# Patient Record
Sex: Female | Born: 1960 | Race: Black or African American | Hispanic: No | Marital: Single | Smoking: Former smoker
Health system: Southern US, Community
[De-identification: ages and names within clinical notes are randomized; demographics above are authoritative.]

## PROBLEM LIST (undated history)

## (undated) DIAGNOSIS — N970 Female infertility associated with anovulation: Secondary | ICD-10-CM

## (undated) DIAGNOSIS — E669 Obesity, unspecified: Secondary | ICD-10-CM

## (undated) DIAGNOSIS — Z8742 Personal history of other diseases of the female genital tract: Secondary | ICD-10-CM

## (undated) DIAGNOSIS — M1990 Unspecified osteoarthritis, unspecified site: Secondary | ICD-10-CM

## (undated) DIAGNOSIS — R87619 Unspecified abnormal cytological findings in specimens from cervix uteri: Secondary | ICD-10-CM

## (undated) DIAGNOSIS — I10 Essential (primary) hypertension: Secondary | ICD-10-CM

## (undated) DIAGNOSIS — M069 Rheumatoid arthritis, unspecified: Secondary | ICD-10-CM

## (undated) DIAGNOSIS — N912 Amenorrhea, unspecified: Secondary | ICD-10-CM

## (undated) DIAGNOSIS — Z862 Personal history of diseases of the blood and blood-forming organs and certain disorders involving the immune mechanism: Secondary | ICD-10-CM

## (undated) DIAGNOSIS — R102 Pelvic and perineal pain: Secondary | ICD-10-CM

## (undated) DIAGNOSIS — N644 Mastodynia: Secondary | ICD-10-CM

## (undated) DIAGNOSIS — K219 Gastro-esophageal reflux disease without esophagitis: Secondary | ICD-10-CM

## (undated) DIAGNOSIS — B977 Papillomavirus as the cause of diseases classified elsewhere: Secondary | ICD-10-CM

## (undated) DIAGNOSIS — IMO0002 Reserved for concepts with insufficient information to code with codable children: Secondary | ICD-10-CM

## (undated) DIAGNOSIS — B379 Candidiasis, unspecified: Secondary | ICD-10-CM

## (undated) DIAGNOSIS — B009 Herpesviral infection, unspecified: Secondary | ICD-10-CM

## (undated) HISTORY — DX: Personal history of diseases of the blood and blood-forming organs and certain disorders involving the immune mechanism: Z86.2

## (undated) HISTORY — DX: Obesity, unspecified: E66.9

## (undated) HISTORY — DX: Amenorrhea, unspecified: N91.2

## (undated) HISTORY — DX: Mastodynia: N64.4

## (undated) HISTORY — DX: Female infertility associated with anovulation: N97.0

## (undated) HISTORY — DX: Personal history of other diseases of the female genital tract: Z87.42

## (undated) HISTORY — DX: Papillomavirus as the cause of diseases classified elsewhere: B97.7

## (undated) HISTORY — DX: Rheumatoid arthritis, unspecified: M06.9

## (undated) HISTORY — DX: Herpesviral infection, unspecified: B00.9

## (undated) HISTORY — DX: Pelvic and perineal pain: R10.2

## (undated) HISTORY — DX: Unspecified abnormal cytological findings in specimens from cervix uteri: R87.619

## (undated) HISTORY — DX: Gastro-esophageal reflux disease without esophagitis: K21.9

## (undated) HISTORY — DX: Unspecified osteoarthritis, unspecified site: M19.90

## (undated) HISTORY — DX: Reserved for concepts with insufficient information to code with codable children: IMO0002

## (undated) HISTORY — DX: Essential (primary) hypertension: I10

## (undated) HISTORY — DX: Morbid (severe) obesity due to excess calories: E66.01

## (undated) HISTORY — DX: Candidiasis, unspecified: B37.9

## (undated) SURGERY — EGD (ESOPHAGOGASTRODUODENOSCOPY)
Anesthesia: Moderate Sedation

## (undated) SURGERY — EGD (ESOPHAGOGASTRODUODENOSCOPY)
Anesthesia: Monitor Anesthesia Care

---

## 1998-03-15 ENCOUNTER — Other Ambulatory Visit: Admission: RE | Admit: 1998-03-15 | Discharge: 1998-03-15 | Payer: Self-pay | Admitting: Internal Medicine

## 2000-01-21 ENCOUNTER — Other Ambulatory Visit: Admission: RE | Admit: 2000-01-21 | Discharge: 2000-01-21 | Payer: Self-pay | Admitting: Obstetrics and Gynecology

## 2001-01-06 ENCOUNTER — Other Ambulatory Visit: Admission: RE | Admit: 2001-01-06 | Discharge: 2001-01-06 | Payer: Self-pay | Admitting: Otolaryngology

## 2001-02-08 ENCOUNTER — Other Ambulatory Visit: Admission: RE | Admit: 2001-02-08 | Discharge: 2001-02-08 | Payer: Self-pay | Admitting: Obstetrics and Gynecology

## 2001-02-11 ENCOUNTER — Encounter (INDEPENDENT_AMBULATORY_CARE_PROVIDER_SITE_OTHER): Payer: Self-pay | Admitting: *Deleted

## 2001-02-11 ENCOUNTER — Ambulatory Visit (HOSPITAL_BASED_OUTPATIENT_CLINIC_OR_DEPARTMENT_OTHER): Admission: RE | Admit: 2001-02-11 | Discharge: 2001-02-11 | Payer: Self-pay | Admitting: Otolaryngology

## 2001-03-10 ENCOUNTER — Encounter: Payer: Self-pay | Admitting: Obstetrics and Gynecology

## 2001-03-10 ENCOUNTER — Ambulatory Visit (HOSPITAL_COMMUNITY): Admission: RE | Admit: 2001-03-10 | Discharge: 2001-03-10 | Payer: Self-pay | Admitting: Obstetrics and Gynecology

## 2002-07-12 ENCOUNTER — Other Ambulatory Visit: Admission: RE | Admit: 2002-07-12 | Discharge: 2002-07-12 | Payer: Self-pay | Admitting: Obstetrics and Gynecology

## 2002-07-20 ENCOUNTER — Encounter: Payer: Self-pay | Admitting: Obstetrics and Gynecology

## 2002-07-20 ENCOUNTER — Ambulatory Visit (HOSPITAL_COMMUNITY): Admission: RE | Admit: 2002-07-20 | Discharge: 2002-07-20 | Payer: Self-pay | Admitting: Obstetrics and Gynecology

## 2002-08-18 ENCOUNTER — Ambulatory Visit (HOSPITAL_COMMUNITY): Admission: RE | Admit: 2002-08-18 | Discharge: 2002-08-18 | Payer: Self-pay | Admitting: Nutrition

## 2002-08-18 ENCOUNTER — Encounter: Payer: Self-pay | Admitting: Internal Medicine

## 2002-12-06 ENCOUNTER — Encounter: Payer: Self-pay | Admitting: Internal Medicine

## 2002-12-06 ENCOUNTER — Encounter: Admission: RE | Admit: 2002-12-06 | Discharge: 2002-12-06 | Payer: Self-pay | Admitting: Internal Medicine

## 2002-12-08 ENCOUNTER — Ambulatory Visit (HOSPITAL_COMMUNITY): Admission: RE | Admit: 2002-12-08 | Discharge: 2002-12-08 | Payer: Self-pay | Admitting: Internal Medicine

## 2003-02-13 HISTORY — PX: GASTRIC BYPASS: SHX52

## 2003-06-27 ENCOUNTER — Other Ambulatory Visit: Admission: RE | Admit: 2003-06-27 | Discharge: 2003-06-27 | Payer: Self-pay | Admitting: Obstetrics and Gynecology

## 2003-07-19 ENCOUNTER — Ambulatory Visit (HOSPITAL_COMMUNITY): Admission: RE | Admit: 2003-07-19 | Discharge: 2003-07-19 | Payer: Self-pay | Admitting: Obstetrics and Gynecology

## 2004-09-01 ENCOUNTER — Other Ambulatory Visit: Admission: RE | Admit: 2004-09-01 | Discharge: 2004-09-01 | Payer: Self-pay | Admitting: Obstetrics and Gynecology

## 2004-09-17 ENCOUNTER — Ambulatory Visit (HOSPITAL_COMMUNITY): Admission: RE | Admit: 2004-09-17 | Discharge: 2004-09-17 | Payer: Self-pay | Admitting: Obstetrics and Gynecology

## 2005-10-05 ENCOUNTER — Other Ambulatory Visit: Admission: RE | Admit: 2005-10-05 | Discharge: 2005-10-05 | Payer: Self-pay | Admitting: Obstetrics and Gynecology

## 2005-11-03 ENCOUNTER — Ambulatory Visit (HOSPITAL_COMMUNITY): Admission: RE | Admit: 2005-11-03 | Discharge: 2005-11-03 | Payer: Self-pay | Admitting: Obstetrics and Gynecology

## 2006-07-15 HISTORY — PX: KNEE ARTHROSCOPY: SHX127

## 2006-11-24 ENCOUNTER — Ambulatory Visit (HOSPITAL_COMMUNITY): Admission: RE | Admit: 2006-11-24 | Discharge: 2006-11-24 | Payer: Self-pay | Admitting: Obstetrics and Gynecology

## 2007-07-16 HISTORY — PX: TOTAL KNEE ARTHROPLASTY: SHX125

## 2007-08-08 ENCOUNTER — Inpatient Hospital Stay (HOSPITAL_COMMUNITY): Admission: RE | Admit: 2007-08-08 | Discharge: 2007-08-10 | Payer: Self-pay | Admitting: Orthopedic Surgery

## 2007-08-15 HISTORY — PX: TOTAL KNEE ARTHROPLASTY: SHX125

## 2007-09-13 ENCOUNTER — Inpatient Hospital Stay (HOSPITAL_COMMUNITY): Admission: RE | Admit: 2007-09-13 | Discharge: 2007-09-15 | Payer: Self-pay | Admitting: Orthopedic Surgery

## 2007-11-13 HISTORY — PX: ENDOMETRIAL ABLATION W/ NOVASURE: SUR434

## 2007-12-01 ENCOUNTER — Ambulatory Visit (HOSPITAL_COMMUNITY): Admission: RE | Admit: 2007-12-01 | Discharge: 2007-12-01 | Payer: Self-pay | Admitting: Obstetrics and Gynecology

## 2008-02-13 ENCOUNTER — Ambulatory Visit (HOSPITAL_COMMUNITY): Admission: RE | Admit: 2008-02-13 | Discharge: 2008-02-13 | Payer: Self-pay | Admitting: Obstetrics and Gynecology

## 2008-07-22 IMAGING — CR DG CHEST 2V
3 series · 3 of 3 positions shown · non-contrast
Comparison: none

CLINICAL DATA: Preoperative respiratory exam for knee surgery.
 CHEST ? 2 VIEW:

[view not recorded (1 of 3)]
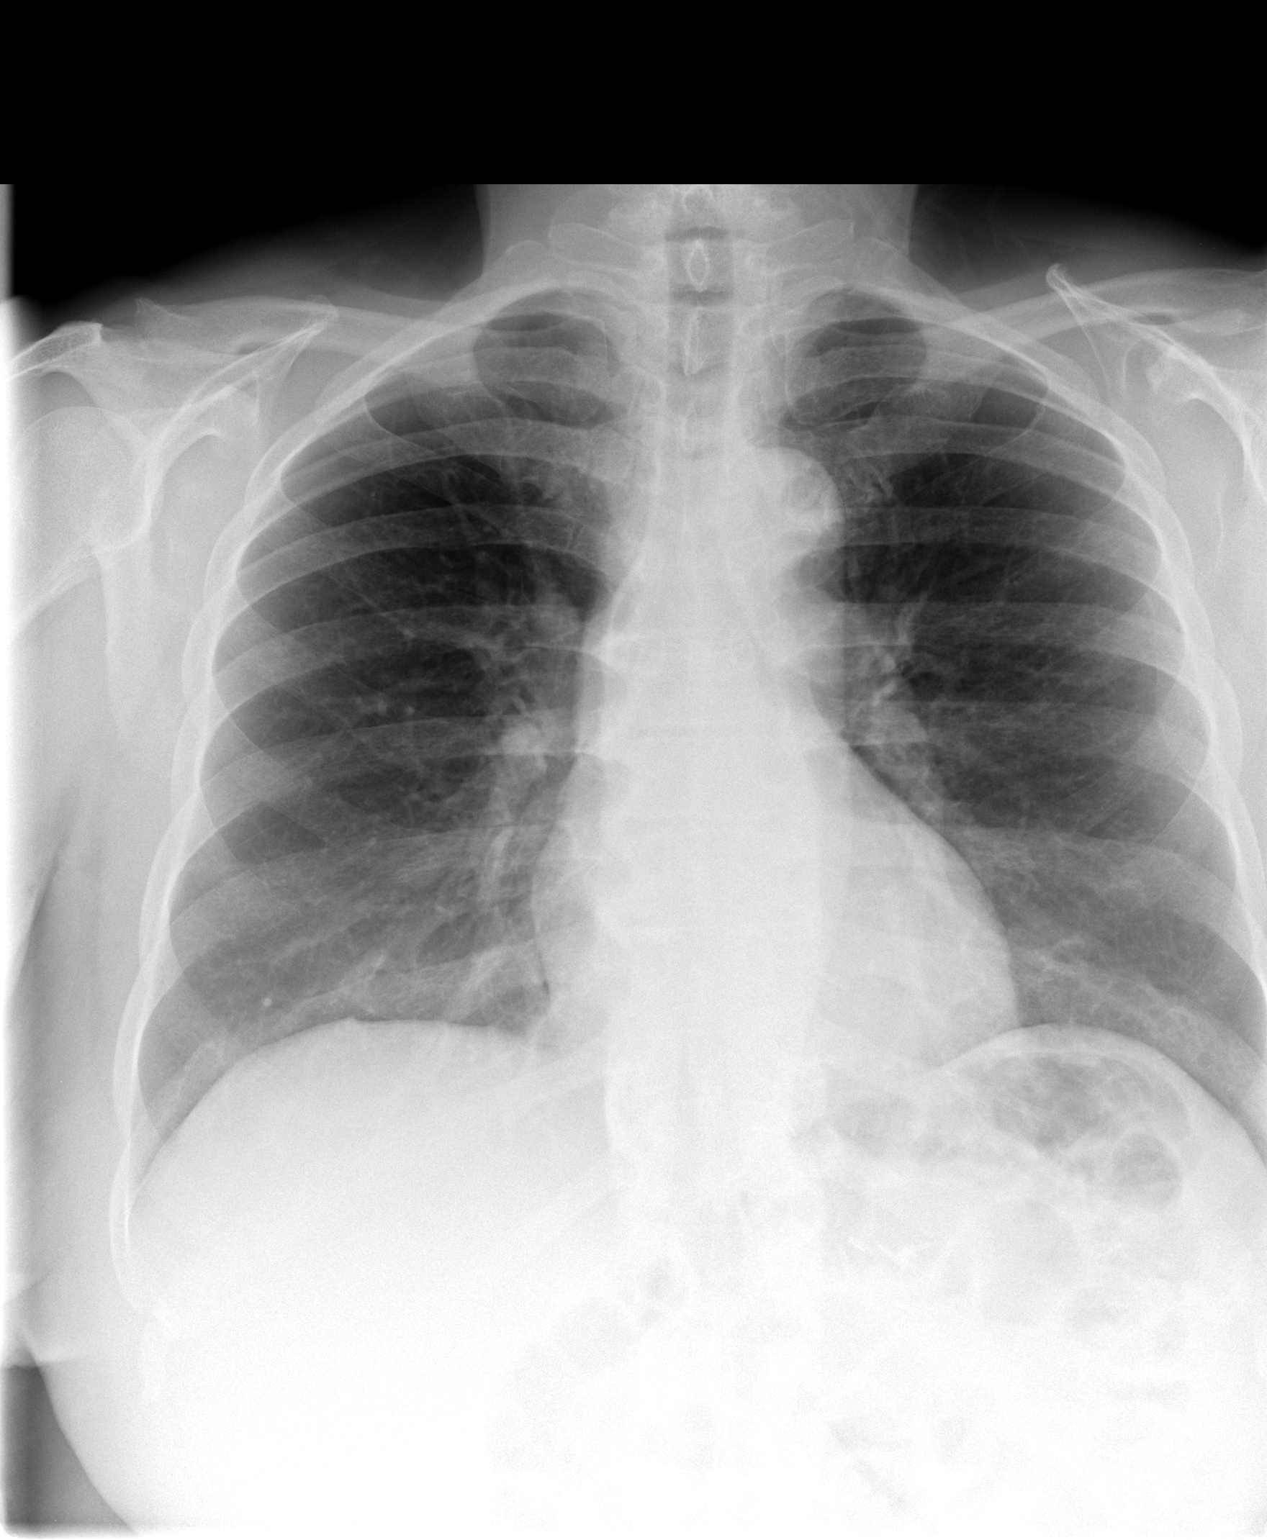

[view not recorded (2 of 3)]
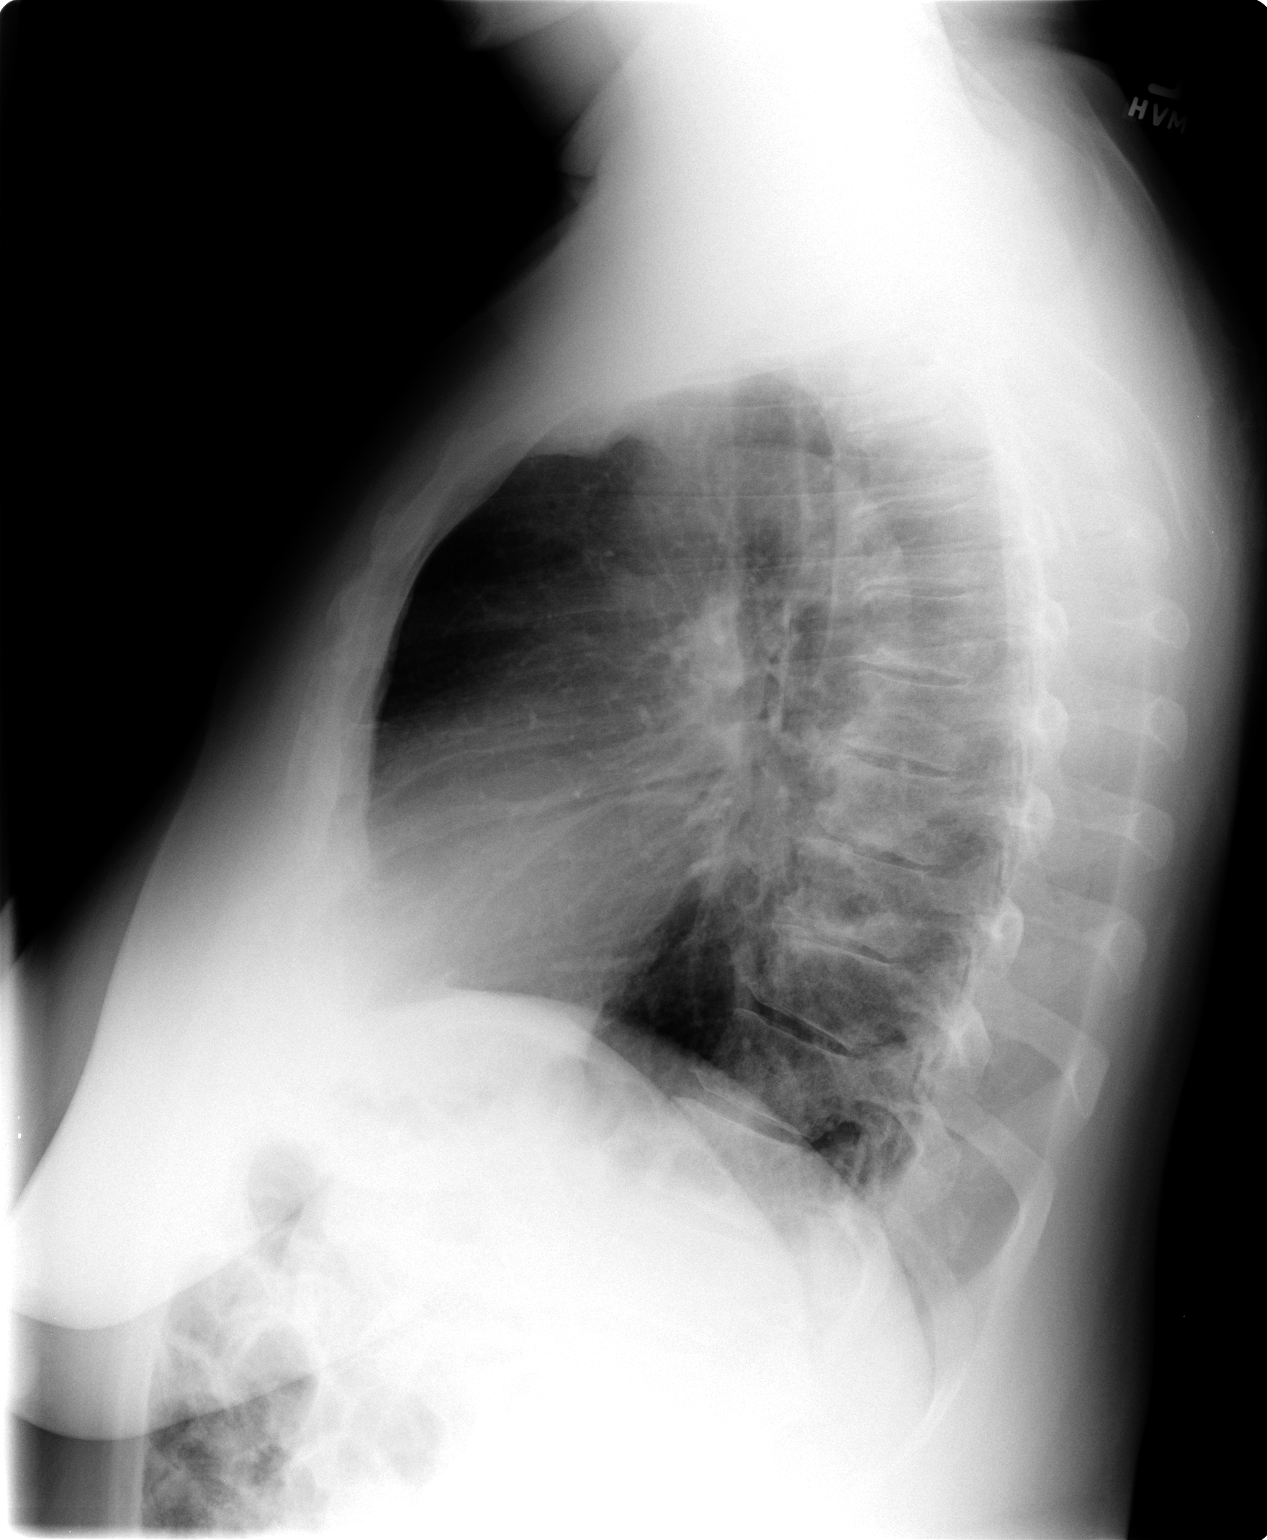

[view not recorded (3 of 3)]
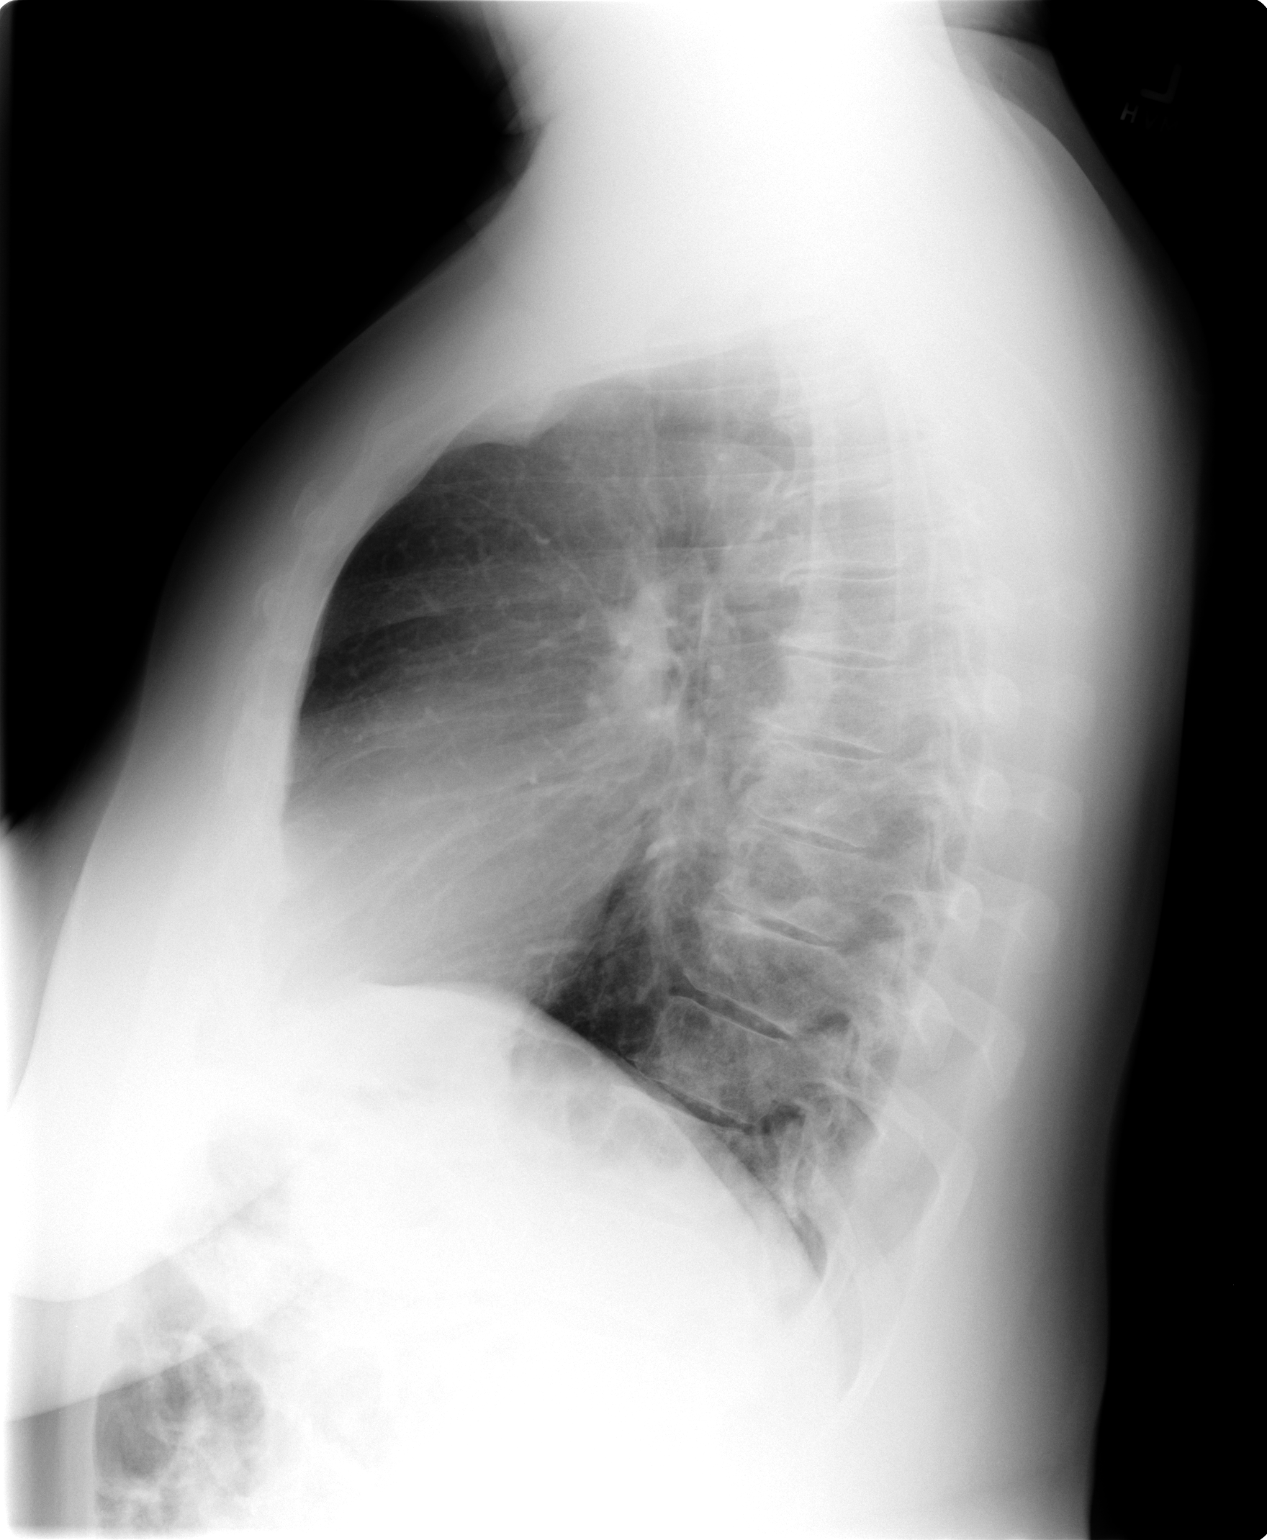

[3 of 3 positions shown; findings below may reference images not displayed]

FINDINGS: Heart and mediastinum are normal.  Lungs are clear.  No effusions.  Ordinary degenerative changes affect the spine.
IMPRESSION: No active disease.

## 2009-03-15 ENCOUNTER — Ambulatory Visit (HOSPITAL_COMMUNITY): Admission: RE | Admit: 2009-03-15 | Discharge: 2009-03-15 | Payer: Self-pay | Admitting: Obstetrics and Gynecology

## 2010-07-01 ENCOUNTER — Ambulatory Visit (HOSPITAL_COMMUNITY): Admission: RE | Admit: 2010-07-01 | Discharge: 2010-07-01 | Payer: Self-pay | Admitting: Internal Medicine

## 2010-10-05 ENCOUNTER — Encounter: Payer: Self-pay | Admitting: Internal Medicine

## 2011-01-27 NOTE — H&P (Signed)
NAME:  Mackenzie Rivera, Mackenzie Rivera                  ACCOUNT NO.:  [id number]   MEDICAL RECORD NO.:  [id number]           PATIENT TYPE:   LOCATION:                                 FACILITY:   PHYSICIAN:  Hal Morales, M.D.DATE OF BIRTH:  Jan 16, 1961   DATE OF ADMISSION:  12/01/2007  DATE OF DISCHARGE:                              HISTORY & PHYSICAL   HISTORY OF PRESENT ILLNESS:  The patient is a 50 year old black single  female, para 0, who presents for endometrial ablation for menorrhagia.  The patient underwent menarche at age 54 and has always had irregular  menses.  She would have several months of amenorrhea followed by  prolonged menses.  For the last 15 years, the patient has managed her  irregular menses and menorrhagia with oral contraceptive pills, and she  currently has good relief of her menses with her oral contraceptive  pills.  She currently also has problems with menstrual migraines, which  have been improved since she has been using her oral contraceptive pills  with 9 weeks of active pills and only 1 week off.  She, however, wishes  to be able to discontinue her oral contraceptive pills and therefore  wants to consider endometrial ablation.  Last menstrual period was  October 28, 2007.  Last Pap smear was November 08, 2007.  Pap smears  have been normal.  The TSH and prolactin in the past have been normal.   PAST MEDICAL HISTORY:  The patient has morbid obesity, which was treated  with gastric bypass surgery in 2004 with approximately a 159-pound  weight loss.  The patient has rheumatoid arthritis and osteoarthritis.  She has migraine headaches.  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   PAST SURGICAL HISTORY:  Positive for bilateral knee replacements, the  first knee being done in November 2008, the second in December 2008.  In  2004 gastric bypass.   CURRENT MEDICATIONS:  Remicade infusion every 4 to 5 weeks, atenolol 50  mg  daily, triamterene/HCTZ 37.5/25 mg 1 tablet every other day,  fexofenadine 180 mg daily, bupropion 300 mg daily, Lutera once daily,  Ambien CR 12.5 mg p.r.n. sleep, hydrocodone/APAP 5/325 every 4 to 6  hours as needed for pain.   DRUG SENSITIVITIES:  No known drug allergies.   REVIEW OF SYSTEMS:  Positive for menorrhagia when not on oral  contraceptive pills,  significant dysmenorrhea when not on oral  contraceptive pills.   PHYSICAL EXAMINATION:  GENERAL:  The patient is an obese black female in  no acute distress.  VITAL SIGNS:  Blood pressure is 110/76, weight is 232 pounds.  Height is  5 feet, 2-1/2 inches.  LUNGS:  Clear.  HEART:  Regular rate and rhythm.  ABDOMEN:  Obese without masses or organomegaly.  PELVIC:  EG, BUS within normal limits.  The vagina is rugous.  The  cervix is without gross lesions.  The uterus does not feel enlarged  though examination is compromised by patient habitus.  Adnexa:  No  palpable masses.  Rectovaginal:  No masses.   LABORATORY STUDIES:  Ultrasound shows a normal-sized uterus with normal  right and left ovaries.  Sonohysterogram shows no intrauterine cavitary  lesion.   IMPRESSION:  1. Long history of abnormal uterine bleeding oligomenorrhea and then      menorrhagia without oral contraceptive pills; however, these      symptoms are controlled with oral contraceptive pills, probable      ovarian or polycystic ovarian syndrome.  2. Chronic hypertension.  3. Morbid obesity.  4. Rheumatoid arthritis.  5. Degenerative joint disease.   DISPOSITION:  The patient wishes to undergo endometrial ablation.  The  indications, risks and benefits have been reviewed, including the risks  of anesthesia, bleeding, infection and damage to adjacent organs,  including uterine perforation.  A discussion was also held with the  patient that her dysmenorrhea may return after discontinuing her oral  contraceptive pills in spite of her endometrial ablation and  that relief  from her menorrhagia cannot be guaranteed.  She verbalizes understanding  and wishes to proceed with endometrial ablation at [facility] on  December 01, 2007.      Hal Morales, M.D.  Electronically Signed     VPH/MEDQ  D:  11/09/2007  T:  11/09/2007  Job:  [id number]

## 2011-01-27 NOTE — Op Note (Signed)
Mackenzie Rivera, Mackenzie Rivera                  ACCOUNT NO.:  [id number]   MEDICAL RECORD NO.:  [id number]          PATIENT TYPE:  INP   LOCATION:  X006                         FACILITY:  [facility]   PHYSICIAN:  Madlyn Frankel. Charlann Boxer, M.D.  DATE OF BIRTH:  10-Apr-1961   DATE OF PROCEDURE:  08/08/2007  DATE OF DISCHARGE:                               OPERATIVE REPORT   PREOPERATIVE DIAGNOSIS:  Right knee end-stage degenerative joint disease  with history of rheumatoid arthritis.   POSTOPERATIVE DIAGNOSIS:  Right knee end-stage degenerative joint  disease with history of rheumatoid arthritis.   PROCEDURE:  Right total knee replacement.   COMPONENTS USED:  A DePuy rotating platform, posterior stabilized knee  with a size 3 femur right at the tibia and 12.5 mm insert and a 35  patellar button.   SURGEON:  Madlyn Frankel. Charlann Boxer, M.D.   ASSISTANT:  Yetta Glassman. Mann, PA   ANESTHESIA:  Dermal spinal.   DRAINS:  One.   TOURNIQUET TIME:  43 minutes at 300 mmHg.   COMPLICATIONS:  None.   INDICATIONS:  Mackenzie Rivera 50 year old female with rheumatoid arthritis,  unfortunately advanced to end-stage bilateral knee osteoarthritis.  Failed conservative measures and injection series of viscus  augmentation, medications and attempted weight loss.  She had already  had lap banding procedure and loss of weight.  Was unable to do much  more activity due to the significant discomfort.  She, at this point,  wished to proceed with surgery.  We discussed extensively the risks of  surgery with her age, her weight, increased risk of infection.  Risks  and benefits were discussed and consent was obtained.   PROCEDURE IN DETAIL:  The patient was brought to operating theater.  Once adequate anesthesia and preoperative antibiotics, Ancef 2 grams,  were administered, the patient was positioned supine with a proximal  thigh tourniquet placed.  The right lower extremity was prescrubbed and  prepped and draped in sterile fashion.   Midline incision was made  followed by median parapatellar arthrotomy with patella subluxation.  Following initial debridement, including synovectomy, attention was  directed to patella.  Precut measure was 24 mm.  I resected down to 14  and used a 35 patellar button.  This fit best in the proximal distal  dimension.   The drill holes were made and the metal shim placed.  This protected the  patella from retractors.  Attention was now directed to the femur.  Femoral canal was opened with a drill, irrigated to prevent fat emboli.  Then passed an intramedullary rod at 5 degrees valgus.  I resected 10 mm  bone off the distal femur.  I sized the femur to be a size 3, then based  on the posterior condylar axis which was perpendicular to the  Whiteside's line I made the anterior-posterior and chamfer cuts.  Then  finished off the femoral cut with the box cut, based off the lateral  aspect of distal femur.  Tibial was not subluxated forward.  I was able  to perform  meniscectomies medially and lateral.  Then using  extramedullary  device, I resected 10 mm bone off the lateral side of the  knee.   The cut was made and then sized the tibia to be a size 3.  I checked the  line rod and was happy the cut was perpendicular.  Extension check with  extensor block and found the knee came out to the least extension at  this point. At this point we used laminar spreaders and debrided large  osteophytes off mainly on the posterior medial side of the knee but also  some of the posterior lateral to the point where there was no debris and  no palpable osteophytes.  I then finished off the tibia with a drill and  keel punch and a trial reduction with 3 femur, 3 tibia.  Initially 10  insert due to her size was difficult to ascertain the extensor versus  hyperextension, but thought there may have been a little bit of  hyperextension.  Thus I used the 12.5 insert.  The knee appeared to come  out straight, stable  ligaments from extension to flexion. At this point  all trial components were removed.  The knee was irrigated normal saline  solution.  I injected the synovial capsule layer with 60 mL of 0.25%  Marcaine with epinephrine and 1 mL of Toradol.  Cement was mixed and the  final components were cemented in.  The knee was brought to extension  and extruded cement removed.   Once the cement had cured, excessive cement was removed, the final 12.5  x 3 insert was inserted following placement 5 mL of FloSeal in the  posterior medial and lateral joint line.  At this point a medium Hemovac  drain was placed and the tourniquet was let down at 43 minutes.  The  extensor mechanism was then reapproximated in flexion with #1 Vicryl.  The main wound was closed with 2-0 Vicryl  and running 4-0 Monocryl.  The knee was cleaned, dried and dressed sterilely with Steri-Strips and  dressing sponges and bulky sterile wrap.  She was brought recovery room  in stable condition.      Madlyn Frankel Charlann Boxer, M.D.  Electronically Signed     MDO/MEDQ  D:  08/08/2007  T:  08/08/2007  Job:  [id number]

## 2011-01-27 NOTE — H&P (Signed)
NAME:  Mackenzie Rivera, MIDKIFF                  ACCOUNT NO.:  [id number]   MEDICAL RECORD NO.:  [id number]          PATIENT TYPE:  INP   LOCATION:  NA                           FACILITY:  [facility]   PHYSICIAN:  Madlyn Frankel. Charlann Boxer, M.D.  DATE OF BIRTH:  [birth year]   DATE OF ADMISSION:  DATE OF DISCHARGE:                              HISTORY & PHYSICAL   PROCEDURE:  Left total knee arthroplasty.   CHIEF COMPLAINT:  Left knee pain.   HISTORY OF PRESENT ILLNESS:  This is a 50 year old female with a history  of left knee pain secondary to osteoarthritis with a recent history of a  right total knee replacement.  She is doing very well with this.  She  progressed nicely with physical therapy and has reached most of the  range of motion and strength goals at this point.  Her left knee has  been refractory to all conservative treatment at this point.  She has  been treated surgically in the past by Dr. [name].   PAST MEDICAL HISTORY:  Significant for:  1. Osteoarthritis.  2. Rheumatoid arthritis.  3. Hypertension.   PAST SURGICAL HISTORY:  Right total knee replacement in November 2008.   FAMILY HISTORY:  Noncontributory.   SOCIAL HISTORY:  Single.  Primary caregiver after surgery will be family  in the home.   DRUG ALLERGIES:  NO KNOWN DRUG ALLERGIES.   MEDICATIONS:  1. Remicade infusion every 5 weeks.  2. Atenolol 50 mg 1 p.o. daily.  3. Triamterene HCT 37.5/25 one p.o. daily.  4. Fexofenadine 180 mg 1 p.o. daily.  5. Bupropion XL 300 mg 1 p.o. daily.  6. Lutera 28 pack generic for Alesse 1 daily.  7. Meloxicam 7.5-mg tablet 1 p.o. daily.  8. Betimol ophthalmic solution 10 mL 1 drop each eye twice daily.  9. Multivitamin daily.  10.Calcium plus D 600 mg p.o. daily.  11.Iron pills 65 mg 1 p.o. daily.  12.Vitamin B12 pills 500 mcg 1 p.o. daily.  13.Ambien CR 12.5-mg tablet p.o. every night p.r.n.   REVIEW OF SYSTEMS:  None other than HPI.   PHYSICAL EXAMINATION:  Pulse 72,  respirations 18, blood pressure 124/78.  GENERAL:  Awake, alert and oriented, well-developed, well-nourished, no  acute distress.  NECK:  Supple.  No carotid bruits.  CHEST/LUNGS:  Clear to auscultation bilaterally.  BREASTS:  Deferred.  HEART:  Regular rate and rhythm without murmurs.  ABDOMEN:  Soft, nontender, nondistended.  Bowel sounds present.  GENITOURINARY:  Deferred.  EXTREMITIES:  Dorsalis pedis pulse positive left lower extremity.  SKIN:  No cellulitis.  NEUROLOGIC:  Intact distal sensibilities.   LABORATORY DATA:  EKG, chest x-ray pending presurgical testing.   IMPRESSION:  1. Osteoarthritis.  2. Rheumatoid arthritis.  3. Hypertension.   PLAN OF ACTION:  Is a left total knee arthroplasty at [facility] September 13, 2007 by surgeon, Dr. Durene Romans.  Risks and  complications were discussed.   Postoperative medications including Lovenox, Robaxin, iron, aspirin,  Colace, and MiraLax provided at time of history and physical.  Postoperative pain medicines  will be provided time of surgery.     ______________________________  Yetta Glassman [address], [place]      Madlyn Frankel. Charlann Boxer, M.D.  Electronically Signed    BLM/MEDQ  D:  09/05/2007  T:  09/06/2007  Job:  [id number]   cc:   Merlene Laughter. Renae Gloss, M.D.  Fax: [phone]

## 2011-01-27 NOTE — Op Note (Signed)
NAME:  Mackenzie Rivera, Mackenzie Rivera                  ACCOUNT NO.:  [id number]   MEDICAL RECORD NO.:  [id number]          PATIENT TYPE:  AMB   LOCATION:  SDC                           FACILITY:  [facility]   PHYSICIAN:  Hal Morales, M.D.DATE OF BIRTH:  1961-07-22   DATE OF PROCEDURE:  12/01/2007  DATE OF DISCHARGE:                               OPERATIVE REPORT   PREOPERATIVE DIAGNOSIS:  Menorrhagia.   POSTOPERATIVE DIAGNOSIS:  Menorrhagia.   OPERATION:  Endometrial ablation with ThermaChoice.   SURGEON:  Dr. Dierdre Forth.   ANESTHESIA:  General LMA.   FINDINGS:  The uterus sounded to 8 cm.   PROCEDURE:  The patient was taken to the operating room after  appropriate identification and placed on the operating table.  After the  attainment of adequate general anesthesia, she was placed in the  lithotomy position.  The perineum and vagina were prepped with multiple  layers of Betadine and draped as a sterile field.  A red Robinson  catheter was used to empty the bladder.  A Graves speculum was placed in  the vagina and a single-tooth tenaculum placed on the anterior cervix.  The uterus was sounded to 8 cm.  The cervix was dilated to a #15  dilator.  The ThermaChoice instrument was prepared by priming the  balloon with approximately 20 mL of D5W and then removing that fluid and  the air in the balloon to a measured pressure of between -180 and -200  mmHg.  Once this had been achieved, the balloon device was inserted into  the endometrial cavity and the balloon slowly filled with fluid to  achieve a pressure of approximately 160 mmHg.  Once this had been  achieved, the heating cycle began and once the temperature of the fluid  achieved 87 degrees centigrade, the therapeutic cycle was begun and  completed after 8 minutes.  The fluid was allowed to cool and then  removed from the balloon.  The entire instrument was then removed from  the endometrial cavity.  The tenaculum was removed and the  vaginal  speculum removed.  The patient was awakened from general anesthesia and  taken to the recovery room in satisfactory condition, having tolerated  the procedure well with sponge and instrument counts correct.      Hal Morales, M.D.  Electronically Signed     VPH/MEDQ  D:  12/01/2007  T:  12/01/2007  Job:  [id number]

## 2011-01-27 NOTE — H&P (Signed)
NAME:  Mackenzie Rivera, Mackenzie Rivera                  ACCOUNT NO.:  [id number]   MEDICAL RECORD NO.:  [id number]          PATIENT TYPE:  INP   LOCATION:  NA                           FACILITY:  [facility]   PHYSICIAN:  Madlyn Frankel. Charlann Boxer, M.D.  DATE OF BIRTH:  04/08/1961   DATE OF ADMISSION:  DATE OF DISCHARGE:                              HISTORY & PHYSICAL   PROCEDURE:  Right total knee arthroplasty.   CHIEF COMPLAINT:  Right knee pain.   HISTORY OF PRESENT ILLNESS:  This is a 50 year old female with a history  of right knee pain secondary to osteoarthritis.  This has been  refractory to all conservative treatment.  She has been presurgically  assessed and cleared by Dr. Renae Gloss.   PAST SURGICAL HISTORY:  1. Gastric bypass surgery in 2004.  2. Bilateral knee scopes in November 2007.   FAMILY HISTORY:  Noncontributory.   SOCIAL HISTORY:  Single.  Primary caregiver after surgery will be family  in the home.   DRUG ALLERGIES:  No known drug allergies.   MEDICATIONS:  1. Remicade infusion every 5 weeks.  The last infusion was July 07, 2007.  The next scheduled is August 05, 2007, but postponed due      to knee replacement.  2. Atenolol tabs 50 mg one p.o. daily.  3. Triamterene/HCTZ 37.5/25 one p.o. daily.  4. Fexofenadine 180 mg one p.o. daily.  5. Bupropion XL 300 mg one p.o. daily.  6. Lutera 28 packs, generic for less Alesse 1 daily.  7. Meloxicam 7.5 mg tablet one p.o. daily.  8. Betimol ophthalmic solution 10 mL one drop each eye twice daily.  9. Multivitamin daily.  10.Calcium plus D 600 mg daily.  11.Iron pills 65 mg one p.o. daily.  12.Vitamin B12 pills 500 mcg one p.o. daily.  13.Ambien CR 12.5 mg tablets p.o. nightly p.r.n.   REVIEW OF SYSTEMS:  Musculoskeletal joint pain and morning stiffness.  Otherwise see HPI.   PHYSICAL EXAMINATION:  VITAL SIGNS:  Pulse 64.  Respirations 18.  Blood  pressure 120/82.  GENERAL APPEARANCE:  Awake, alert, well-developed and well-nourished  in  acute distress.  NECK:  Supple.  No carotid bruits.  CHEST/LUNGS:  Clear to auscultation bilaterally.  BREASTS:  Deferred.  HEART:  Regular rate and rhythm without gallops, clicks, rubs or  murmurs.  ABDOMEN:  Soft, nontender and nondistended.  Bowel sounds present.  GENITOURINARY:  Deferred.  EXTREMITIES:  Right dorsalis pedis pulse positive.  Range of motion of  right knee is approximately 0-100, limited by body habitus.  SKIN:  No cellulitis.  NEUROLOGIC:  Intact distal sensibilities.   LABORATORY:  EKG and chest x-ray are pending.   IMPRESSION:  1. Osteoarthritis.  2. Rheumatoid arthritis.  3. Hypertension.   PLAN OF ACTION:  Right total knee replacement by surgeon, Dr. Durene Romans.  The risks and complications were discussed.   POSTOPERATIVE MEDICATIONS:  Include Lovenox, Robaxin, iron, aspirin,  Colace and MiraLax were provided at time of history and physical.  Postoperative pain medicines will be provided at the  time of surgery.     ______________________________  Yetta Glassman [address], [place]      Madlyn Frankel. Charlann Boxer, M.D.  Electronically Signed    BLM/MEDQ  D:  08/04/2007  T:  08/05/2007  Job:  [id number]   cc:   Merlene Laughter. Renae Gloss, M.D.  Fax: [phone]

## 2011-01-27 NOTE — Op Note (Signed)
NAME:  Mackenzie Rivera, DETTMANN                  ACCOUNT NO.:  [id number]   MEDICAL RECORD NO.:  [id number]          PATIENT TYPE:  INP   LOCATION:  0005                         FACILITY:  [facility]   PHYSICIAN:  Madlyn Frankel. Charlann Boxer, M.D.  DATE OF BIRTH:  01-28-1961   DATE OF PROCEDURE:  09/13/2007  DATE OF DISCHARGE:                               OPERATIVE REPORT   PREOPERATIVE DIAGNOSIS:  Left knee end-stage osteoarthritis.   POSTOPERATIVE DIAGNOSIS:  Left knee end-stage osteoarthritis.   PROCEDURE:  Left total knee replacement.   COMPONENTS USED:  DePuy rotating platform, posterior stabilized knee  system size 3 femur, 3 tibia, 10 mm insert and a 35 patellar button.   SURGEON:  Madlyn Frankel. Charlann Boxer, M.D.   ASSISTANT:  Yetta Glassman. Marcellus Scott.   ANESTHESIA:  Dermal or spinal.   COMPLICATIONS:  None.   DRAINS:  Times one.   COMPLICATIONS:  None.   INDICATIONS FOR PROCEDURE:  Ms. Jester is a 50 year old patient of mine  with end-stage bilateral knee replacement and history of right total  knee replacement.  She had done well with that and is ready to proceed  with the left knee and the planned staged procedure.  The risks and  benefits were reviewed in preparation for the procedure, and consent was  obtained.   PROCEDURE IN DETAIL:  The patient was brought to the operative theater.  Once adequate anesthesia and preoperative antibiotics, Ancef, were  administered, the patient was positioned supine.  A proximal thigh  tourniquet was placed.  The left lower extremity was then pre scrubbed  and prepped and draped in sterile fashion.  A midline incision was made,  followed by a median parapatellar arthrotomy with patella subluxation  rather than eversion.  Following initial debridement, attention was  directed to the patella.  Precut measurement was 22 mm, resected down to  14 mm in symmetric cuts.  We used a 35 patellar button which had been  used on the other side.  Drill holes were made and a metal  shim placed  to protect the patella from retractors.  Attention was now directed to  the femur.  Femoral exposure was obtained in routine fashion.  A drill  was entered into the canal and the canal irrigated of fat emboli.  The  intramedullary rod was then passed at 5 degrees valgus.  I resected 10  mm of bone off the distal femur.   I sized the femur and felt at this point that a size 2.5 fit better as  opposed to the other side I used size 3 based on the posterior condylar  axis which was in close proximity to the perpendicular to Whiteside's  line.  The anterior-posterior chamfer cuts were all made.  The trochlear  box cut was then made based off the lateral aspect of the distal femur.  Attention was now directed to the tibia.  Following tibial exposure,  meniscectomies and cruciate stump debridement, I resected 10 mm of bone  off the proximal tibia based at the lateral tibia.  I checked with the  extension block  and was happy the knee came out to full extension.   At this point, final preparation of the tibia was carried out with a  size 2.5.  once I set its rotation, I drilled, pinned it and did a keel  punch.  Trial reduction then carried out.  A 2.5 femur, 2.5 tibia and 10  mm insert were placed.  The knee came out to full extension and was  stable from extension and flexion.  The 35 patellar button tracked  without application of pressure.   At this point, the trial components were removed.  I injected the  synovial capsule layer with 60 mL of 0.25% Marcaine and epinephrine and  1 mL of Toradol.  The knee was then copiously irrigated with normal  saline solution with pulse lavage.  The final components were opened and  cement mixed.  The components were cemented in position with the femur.  The knee was brought to extension with a 10 mm insert.  Extruded cement  was removed and debrided.  Once the cement had cured, excessive cement  was debrided throughout the knee.  Once I was  satisfied there was no  further cement, the tourniquet was let down at 42 minutes.  The final  insert was then placed.  The knee was cleaned, dried and dressed  sterilely with Steri-Strips and a bulky sterile dressing.  She was  brought to the recovery room and extubated in stable condition.  Please  note the wound was closed in layers.  The knee was closed.  The extensor  mechanism was then closed with #1 Vicryl with the knee in flexion and  the remainder of the wound with 2-0 Vicryl and 4-0 Monocryl.      Madlyn Frankel Charlann Boxer, M.D.  Electronically Signed     MDO/MEDQ  D:  09/13/2007  T:  09/13/2007  Job:  [id number]

## 2011-01-30 NOTE — Op Note (Signed)
[place]  Patient:    Mackenzie Rivera, Mackenzie Rivera                           MRN: [id number] Proc. Date: 02/11/01 Adm. Date:  [id number] Attending:  Lucky Cowboy CC:         Merlene Laughter. Renae Gloss, M.D.   Ear, Nose, and Throat   Operative Report  PREOPERATIVE DIAGNOSIS:  Bilateral neck adenopathy.  POSTOPERATIVE DIAGNOSIS:  Bilateral neck adenopathy.  PROCEDURE:  Excisional biopsy of right submandibular lymph node.  SURGEON:  Lucky Cowboy, M.D.  ASSISTANT:  Margit Banda. Jearld Fenton, M.D.  ANESTHESIA:  General endotracheal anesthesia.  ESTIMATED BLOOD LOSS:  30 cc.  SPECIMENS:  Right submandibular lymph node.  COMPLICATIONS:  None.  INDICATIONS:  This patient is a 50 year old female with approximately a 45-month history of bilateral enlarging neck adenopathy.  Adenopathy was present, but initially did not enlarge.  These did not meet size criteria initially, however, they have enlarged and are particularly prominent in the right submandibular region.  FNA was performed which was suggestive of reactive lymph node.  Due to the size and firmness of the node, right submandibular excisional biopsy is performed.  FINDINGS:  The patient was noted to have a lymph node attached to the submandibular gland.  It was on the lateral surface and was approximately 2.5 cm.  This was dissected off of this submandibular gland.  DESCRIPTION OF PROCEDURE:  The patient was taken to the operating room and placed on the table in the supine position.  She was then placed under general anesthesia and the neck gently extended using a shoulder roll.  Betadine was placed over the neck for prep and the patient draped in the usual sterile fashion.  An incision was made in the right neck two fingerbreadths below the lower margin of the mandible in an actual skin crease.  This was made with a #15 blade through the skin.  Subcutaneous tissue and platysma was divided using Bovie  electrocautery.  The capsule of the lymph node was identified inferiorly.  The fascia was elevated superiorly and dissection continued immediately lateral to the lymph node.  This was dissected off of the digastric muscle.  The submandibular gland was identified and palpated to be normal.  This was dissected using sharp dissection and with tying of the vessels using 3-0 silk suture.  It was then released.  The wound was copiously irrigated with normal saline.  The platysma was reapproximated in a simple interrupted buried fashion using 3-0 Vicryl.  The skin was closed in a running stitch using 4-0 Prolene.  Bacitracin ointment was applied.  A fluff dressing was then added.  The patient was then awakened from anesthesia and extubated in the operating room.  She was taken to the postanesthesia care unit in stable condition.  There were no complications. DD:  02/11/01 TD:  02/11/01 Job: [id number]

## 2011-02-12 ENCOUNTER — Telehealth: Payer: Self-pay | Admitting: Gastroenterology

## 2011-02-12 NOTE — Telephone Encounter (Signed)
Pt may have pancreatitis. Dr. Eulah Citizen like pt to be seen tomorrow. Appt made for pt to see Dr. Russella Dar 02/13/11@11am . Pt to be here early. Office faxing records.

## 2011-02-13 ENCOUNTER — Other Ambulatory Visit (INDEPENDENT_AMBULATORY_CARE_PROVIDER_SITE_OTHER): Payer: BC Managed Care – PPO

## 2011-02-13 ENCOUNTER — Encounter: Payer: Self-pay | Admitting: Gastroenterology

## 2011-02-13 ENCOUNTER — Ambulatory Visit (INDEPENDENT_AMBULATORY_CARE_PROVIDER_SITE_OTHER): Payer: BC Managed Care – PPO | Admitting: Gastroenterology

## 2011-02-13 VITALS — BP 110/62 | HR 56 | Ht 62.0 in | Wt 283.0 lb

## 2011-02-13 DIAGNOSIS — R1013 Epigastric pain: Secondary | ICD-10-CM

## 2011-02-13 LAB — LIPASE: Lipase: 28 U/L (ref 11.0–59.0)

## 2011-02-13 LAB — AMYLASE: Amylase: 159 U/L — ABNORMAL HIGH (ref 27–131)

## 2011-02-13 MED ORDER — OMEPRAZOLE 40 MG PO CPDR: 40.0000 mg | DELAYED_RELEASE_CAPSULE | Freq: Every day | ORAL | Status: DC

## 2011-02-13 NOTE — Patient Instructions (Addendum)
You have been scheduled for a CT scan of the abdomen with contrast. Separate instructions given. Stop taking Zantac and start omeprazole once daily. A prescription has been sent to your pharmacy.  Patient advised to avoid spicy, acidic, citrus, chocolate, mints, fruit and fruit juices.  Limit the intake of caffeine, alcohol and Soda.  Don't exercise too soon after eating.  Don't lie down within 3-4 hours of eating.  Elevate the head of your bed. Go directly to the basement to have your labs drawn today. cc: Syliva Overman, MD    [name], MD

## 2011-02-13 NOTE — Progress Notes (Signed)
History of Present Illness: This is a 50 year-old female with a two-month history of epigastric pain. Initially her symptoms were constant but they have improved substantially taking ranitidine 150 mg twice daily now they are generally only bothersome in the morning. She has not had typical substernal burning and the pain does not radiate to the back or chest. Recent blood work showed a unremarkable CBC and CMET except for a low albumin of 3.2, a low hematocrit of 36.6 and a low MCV of 81.8. Lipase was minimally elevated at 234 (upper normal of 230) and amylase was modestly elevated at 161 to (upper limit of 115). She has no history of pancreatic or biliary problems. She is status post a Roux-en-Y gastric bypass in 2004 at [facility]. She denies dysphagia, odynophagia, nausea, vomiting, weight loss, change in bowel habits, melena or hematochezia.  Past Medical History  Diagnosis Date  . Rheumatoid arthritis   . Osteoarthritis   . Obesity   . Hypertension    Past Surgical History  Procedure Date  . Endometrial ablation w/ novasure 11/2007  . Total knee arthroplasty 08/2007    Left  . Total knee arthroplasty 07/2007    Right  . Knee arthroscopy 07/2006    Bilateral  . Gastric bypass 02/2003    reports that she quit smoking about 17 months ago. She does not have any smokeless tobacco history on file. She reports that she drinks alcohol. She reports that she does not use illicit drugs. family history includes Diabetes in her maternal uncle and paternal uncle and Kidney disease in her father.  There is no history of Colon cancer. No Known Allergies    Outpatient Encounter Prescriptions as of 02/13/2011  Medication Sig Dispense Refill  . atenolol (TENORMIN) 50 MG tablet Take 50 mg by mouth daily.        . calcium citrate (CALCITRATE - DOSED IN MG ELEMENTAL CALCIUM) 950 MG tablet Take 1 tablet by mouth daily.        . Cetirizine HCl (ZYRTEC ALLERGY) 10 MG CAPS Take 1 capsule by mouth daily.        .  ergocalciferol (VITAMIN D2) 50000 UNITS capsule Take 50,000 Units by mouth once a week.        . Ferrous Sulfate (IRON) 325 (65 FE) MG TABS Take 1 capsule by mouth daily.        . InFLIXimab (REMICADE IV) Inject 1 application into the vein every 30 (thirty) days.        . Multiple Vitamin (MULTIVITAMIN) tablet Take 1 tablet by mouth daily.        . ranitidine (ZANTAC) 150 MG capsule Take 150 mg by mouth 2 (two) times daily.        Marland Kitchen triamterene-hydrochlorothiazide (MAXZIDE-25) 37.5-25 MG per tablet Take 1 tablet by mouth every other day.        . vitamin B-12 (CYANOCOBALAMIN) 100 MCG tablet Take 50 mcg by mouth daily.        Marland Kitchen omeprazole (PRILOSEC) 40 MG capsule Take 1 capsule (40 mg total) by mouth daily.  30 capsule  11  . DISCONTD: buPROPion (WELLBUTRIN XL) 300 MG 24 hr tablet Take 300 mg by mouth daily.        Marland Kitchen DISCONTD: fexofenadine (ALLEGRA) 180 MG tablet Take 180 mg by mouth daily.        Marland Kitchen DISCONTD: vitamin B-12 (CYANOCOBALAMIN) 500 MCG tablet Take 500 mcg by mouth daily.          Review  of Systems: Pertinent positive and negative review of systems were noted in the above HPI section. All other review of systems were otherwise negative.  Physical Exam: General: Morbidy obese. Well developed , well nourished, no acute distress Head: Normocephalic and atraumatic Eyes:  sclerae anicteric, EOMI Ears: Normal auditory acuity Mouth: No deformity or lesions Neck: Supple, no masses or thyromegaly Lungs: Clear throughout to auscultation Heart: Regular rate and rhythm; no murmurs, rubs or bruits Abdomen: Soft, non tender and non distended. No masses, hepatosplenomegaly or hernias noted. Normal Bowel sounds Musculoskeletal: Symmetrical with no gross deformities  Skin: No lesions on visible extremities Pulses:  Normal pulses noted Extremities: No clubbing, cyanosis, edema or deformities noted Neurological: Alert oriented x 4, grossly nonfocal Cervical Nodes:  No significant cervical  adenopathy Inguinal Nodes: No significant inguinal adenopathy Psychological:  Alert and cooperative. Normal mood and affect  Assessment and Recommendations:  1. Epigastric pain. Symptoms have improved substantially with ranitidine. I suspect she has gastritis, ulcer disease or GERD. Her symptoms are  atypical for pancreatitis. Discontinue ranitidine and begin omeprazole 40 mg daily with standard antireflux measures. If her symptoms do not resolve will proceed with upper endoscopy further evaluation.  2. Nonspecific elevation of amylase and lipase. Her symptoms are atypical for pancreatitis. The modest elevations of amylase and lipase are not diagnostic of pancreatitis. Proceed with an abdominal and pelvic CT to further evaluate for any pancreatic disorders. Repeat amylase and lipase and pancreatic isoenzymes today.  3. Colorectal cancer screening. Average risk for colon cancer. Routine screening colonoscopy at age 67, October 2013.   4. Status post gastric bypass.  5. Rheumatoid arthritis maintained on chronic infliximab.

## 2011-02-15 LAB — AMYLASE ISOENZYMES
Amylase.: 103 U/L — ABNORMAL HIGH (ref 21–101)
Isoamylase-Pancreatic: 111 U/L — ABNORMAL HIGH (ref 16–46)

## 2011-02-17 ENCOUNTER — Ambulatory Visit (INDEPENDENT_AMBULATORY_CARE_PROVIDER_SITE_OTHER)
Admission: RE | Admit: 2011-02-17 | Discharge: 2011-02-17 | Disposition: A | Discharge status: Home / Self Care | Payer: BC Managed Care – PPO | Source: Ambulatory Visit | Attending: Gastroenterology | Admitting: Gastroenterology

## 2011-02-17 DIAGNOSIS — R1013 Epigastric pain: Secondary | ICD-10-CM

## 2011-02-17 MED ORDER — IOHEXOL 300 MG/ML  SOLN
100.0000 mL | Freq: Once | INTRAMUSCULAR | Status: AC | PRN
  Administered 2011-02-17: 100 mL via INTRAVENOUS

## 2011-03-11 ENCOUNTER — Encounter: Payer: Self-pay | Admitting: Gastroenterology

## 2011-03-11 ENCOUNTER — Ambulatory Visit (INDEPENDENT_AMBULATORY_CARE_PROVIDER_SITE_OTHER): Payer: BC Managed Care – PPO | Admitting: Gastroenterology

## 2011-03-11 VITALS — BP 100/58 | HR 60 | Ht 62.0 in | Wt 280.6 lb

## 2011-03-11 DIAGNOSIS — R7989 Other specified abnormal findings of blood chemistry: Secondary | ICD-10-CM

## 2011-03-11 DIAGNOSIS — R1013 Epigastric pain: Secondary | ICD-10-CM

## 2011-03-11 DIAGNOSIS — R799 Abnormal finding of blood chemistry, unspecified: Secondary | ICD-10-CM

## 2011-03-11 NOTE — Progress Notes (Signed)
History of Present Illness: This is a 50 year old female returning for followup of epigastric pain and elevated pancreas enzymes. I reviewed her for followup amylase, amylase isoenzymes and lipase with her. Her lipase was normal. Amylase mildly elevated with elevated pancreatic isoenzyme. CT scan of the abdomen and pelvis revealed findings consistent with prior gastric bypass surgery and lumbar spondylosis. No pancreatic or other gastrointestinal pathology was noted. Her abdominal pain has almost resolved on omeprazole 40 mg daily. Occasionally she has some minimal epigastric discomfort in the morning.  Current Medications, Allergies, Past Medical History, Past Surgical History, Family History and Social History were reviewed in [employer] record.  Physical Exam: General: Well developed , well nourished, no acute distress Head: Normocephalic and atraumatic Eyes:  sclerae anicteric, EOMI Ears: Normal auditory acuity Mouth: No deformity or lesions Lungs: Clear throughout to auscultation Heart: Regular rate and rhythm; no murmurs, rubs or bruits Abdomen: Soft, non tender and non distended. No masses, hepatosplenomegaly or hernias noted. Normal Bowel sounds Musculoskeletal: Symmetrical with no gross deformities  Pulses:  Normal pulses noted Extremities: No clubbing, cyanosis, edema or deformities noted Neurological: Alert oriented x 4, grossly nonfocal Psychological:  Alert and cooperative. Normal mood and affect  Assessment and Recommendations:  1. Epigastric pain. Presumed gastritis or GERD which has responded well to omeprazole 40 mg daily. Continue omeprazole. May use ranitidine 150 mg at bedtime if she has persistent problems with morning epigastric pain. If her symptoms worsen consider upper endoscopy for further evaluation.  2. Abnormal pancreas enzymes without any clear evidence for pancreatitis. I do not have a clear explanation for her minimally elevated enzymes  however there is no other clinical or CT evidence of pancreatic disease at this time.

## 2011-03-11 NOTE — Patient Instructions (Addendum)
Continue all current medications. cc: [name], MD

## 2011-04-13 ENCOUNTER — Other Ambulatory Visit: Payer: Self-pay

## 2011-04-13 DIAGNOSIS — R1013 Epigastric pain: Secondary | ICD-10-CM

## 2011-04-13 MED ORDER — OMEPRAZOLE 40 MG PO CPDR: 40.0000 mg | DELAYED_RELEASE_CAPSULE | Freq: Every day | ORAL | Status: DC

## 2011-06-04 LAB — BASIC METABOLIC PANEL
BUN: 4 — ABNORMAL LOW
CO2: 26
Calcium: 7.9 — ABNORMAL LOW
Chloride: 107
Creatinine, Ser: 0.66
GFR calc Af Amer: >60
GFR calc non Af Amer: >60
Glucose, Bld: 104 — ABNORMAL HIGH
Potassium: 3.7
Sodium: 137

## 2011-06-04 LAB — CBC
HCT: 25.7 — ABNORMAL LOW
Hemoglobin: 8.9 — ABNORMAL LOW
MCHC: 34.5
MCV: 89.5
Platelets: 230
RBC: 2.87 — ABNORMAL LOW
RDW: 14.8
WBC: 8.8

## 2011-06-08 LAB — BASIC METABOLIC PANEL
BUN: 6
CO2: 25
Calcium: 8.7
Chloride: 107
Creatinine, Ser: 0.7
GFR calc Af Amer: >60
GFR calc non Af Amer: >60
Glucose, Bld: 90
Potassium: 3.7
Sodium: 137

## 2011-06-08 LAB — CBC
HCT: 35.1 — ABNORMAL LOW
Hemoglobin: 11.7 — ABNORMAL LOW
MCHC: 33.5
MCV: 85.2
Platelets: 311
RBC: 4.11
RDW: 14.9
WBC: 8.5

## 2011-06-19 LAB — URINALYSIS, ROUTINE W REFLEX MICROSCOPIC
Bilirubin Urine: NEGATIVE
Glucose, UA: NEGATIVE
Ketones, ur: NEGATIVE
Leukocytes, UA: NEGATIVE
Nitrite: NEGATIVE
Protein, ur: NEGATIVE
Specific Gravity, Urine: 1.026
Urobilinogen, UA: 0.2
pH: 5

## 2011-06-19 LAB — CBC
HCT: 26.6 — ABNORMAL LOW
HCT: 35.3 — ABNORMAL LOW
Hemoglobin: 12
Hemoglobin: 9.1 — ABNORMAL LOW
MCHC: 34
MCHC: 34.1
MCV: 89.5
MCV: 90.3
Platelets: 219
Platelets: 394
RBC: 2.97 — ABNORMAL LOW
RBC: 3.9
RDW: 14.5
RDW: 15.6 — ABNORMAL HIGH
WBC: 7.1
WBC: 8

## 2011-06-19 LAB — URINE MICROSCOPIC-ADD ON

## 2011-06-19 LAB — BASIC METABOLIC PANEL
BUN: 10
BUN: 5 — ABNORMAL LOW
CO2: 27
CO2: 27
Calcium: 7.8 — ABNORMAL LOW
Calcium: 9.2
Chloride: 106
Chloride: 108
Creatinine, Ser: 0.78
Creatinine, Ser: 0.79
GFR calc Af Amer: >60
GFR calc Af Amer: >60
GFR calc non Af Amer: >60
GFR calc non Af Amer: >60
Glucose, Bld: 93
Glucose, Bld: 96
Potassium: 4.2
Potassium: 4.4
Sodium: 137
Sodium: 141

## 2011-06-19 LAB — PROTIME-INR
INR: 1
Prothrombin Time: 13.4

## 2011-06-19 LAB — DIFFERENTIAL
Basophils Absolute: 0
Basophils Relative: 0
Eosinophils Absolute: 0.1 — ABNORMAL LOW
Eosinophils Relative: 1
Lymphocytes Relative: 25
Lymphs Abs: 2
Monocytes Absolute: 0.6
Monocytes Relative: 8
Neutro Abs: 5.4
Neutrophils Relative %: 67

## 2011-06-19 LAB — TYPE AND SCREEN
ABO/RH(D): A POS
Antibody Screen: NEGATIVE

## 2011-06-19 LAB — APTT: aPTT: 27

## 2011-06-19 LAB — PREGNANCY, URINE: Preg Test, Ur: NEGATIVE

## 2011-06-23 LAB — CBC
HCT: 26.4 — ABNORMAL LOW
HCT: 28.3 — ABNORMAL LOW
HCT: 38.3
Hemoglobin: 13.4
Hemoglobin: 9.2 — ABNORMAL LOW
Hemoglobin: 9.8 — ABNORMAL LOW
MCHC: 34.7
MCHC: 34.8
MCHC: 35.1
MCV: 88.1
MCV: 88.7
MCV: 89
Platelets: 210
Platelets: 212
Platelets: 292
RBC: 2.97 — ABNORMAL LOW
RBC: 3.19 — ABNORMAL LOW
RBC: 4.34
RDW: 14.2
RDW: 14.2
RDW: 14.6
WBC: 10
WBC: 8.4
WBC: 8.7

## 2011-06-23 LAB — URINALYSIS, ROUTINE W REFLEX MICROSCOPIC
Bilirubin Urine: NEGATIVE
Glucose, UA: NEGATIVE
Ketones, ur: NEGATIVE
Leukocytes, UA: NEGATIVE
Nitrite: NEGATIVE
Protein, ur: NEGATIVE
Specific Gravity, Urine: 1.014
Urobilinogen, UA: 0.2
pH: 6.5

## 2011-06-23 LAB — COMPREHENSIVE METABOLIC PANEL
ALT: 20
AST: 19
Albumin: 3.3 — ABNORMAL LOW
Alkaline Phosphatase: 70
BUN: 8
CO2: 27
Calcium: 9.4
Chloride: 106
Creatinine, Ser: 0.91
GFR calc Af Amer: >60
GFR calc non Af Amer: >60
Glucose, Bld: 67 — ABNORMAL LOW
Potassium: 3.9
Sodium: 140
Total Bilirubin: 0.8
Total Protein: 7.3

## 2011-06-23 LAB — BASIC METABOLIC PANEL
BUN: 8
BUN: 9
CO2: 25
CO2: 27
Calcium: 7.8 — ABNORMAL LOW
Calcium: 7.9 — ABNORMAL LOW
Chloride: 105
Chloride: 109
Creatinine, Ser: 0.67
Creatinine, Ser: 0.7
GFR calc Af Amer: >60
GFR calc Af Amer: >60
GFR calc non Af Amer: >60
GFR calc non Af Amer: >60
Glucose, Bld: 103 — ABNORMAL HIGH
Glucose, Bld: 92
Potassium: 3.6
Potassium: 3.9
Sodium: 135
Sodium: 137

## 2011-06-23 LAB — APTT: aPTT: 27

## 2011-06-23 LAB — PROTIME-INR
INR: 1.1
Prothrombin Time: 14.5

## 2011-06-23 LAB — TYPE AND SCREEN
ABO/RH(D): A POS
Antibody Screen: NEGATIVE

## 2011-06-23 LAB — URINE MICROSCOPIC-ADD ON

## 2011-06-23 LAB — ABO/RH: ABO/RH(D): A POS

## 2011-07-07 ENCOUNTER — Other Ambulatory Visit (HOSPITAL_COMMUNITY): Payer: Self-pay | Admitting: Obstetrics and Gynecology

## 2011-07-07 DIAGNOSIS — Z1231 Encounter for screening mammogram for malignant neoplasm of breast: Secondary | ICD-10-CM

## 2011-08-04 ENCOUNTER — Ambulatory Visit (HOSPITAL_COMMUNITY)
Admission: RE | Admit: 2011-08-04 | Discharge: 2011-08-04 | Disposition: A | Discharge status: Home / Self Care | Payer: BC Managed Care – PPO | Source: Ambulatory Visit | Attending: Obstetrics and Gynecology | Admitting: Obstetrics and Gynecology

## 2011-08-04 DIAGNOSIS — Z1231 Encounter for screening mammogram for malignant neoplasm of breast: Secondary | ICD-10-CM | POA: Insufficient documentation

## 2012-03-07 ENCOUNTER — Ambulatory Visit: Payer: Self-pay | Admitting: Obstetrics and Gynecology

## 2012-03-21 ENCOUNTER — Other Ambulatory Visit: Payer: Self-pay | Admitting: Gastroenterology

## 2012-03-21 NOTE — Telephone Encounter (Signed)
NEEDS OFFICE VISIT FOR ANY FURTHER REFILLS! 

## 2012-04-15 DIAGNOSIS — N912 Amenorrhea, unspecified: Secondary | ICD-10-CM | POA: Insufficient documentation

## 2012-04-15 DIAGNOSIS — IMO0002 Reserved for concepts with insufficient information to code with codable children: Secondary | ICD-10-CM | POA: Insufficient documentation

## 2012-04-15 DIAGNOSIS — B009 Herpesviral infection, unspecified: Secondary | ICD-10-CM | POA: Insufficient documentation

## 2012-04-15 DIAGNOSIS — K219 Gastro-esophageal reflux disease without esophagitis: Secondary | ICD-10-CM | POA: Insufficient documentation

## 2012-04-15 DIAGNOSIS — M1990 Unspecified osteoarthritis, unspecified site: Secondary | ICD-10-CM | POA: Insufficient documentation

## 2012-04-15 DIAGNOSIS — N970 Female infertility associated with anovulation: Secondary | ICD-10-CM | POA: Insufficient documentation

## 2012-04-15 DIAGNOSIS — B977 Papillomavirus as the cause of diseases classified elsewhere: Secondary | ICD-10-CM | POA: Insufficient documentation

## 2012-04-15 DIAGNOSIS — R102 Pelvic and perineal pain: Secondary | ICD-10-CM | POA: Insufficient documentation

## 2012-04-15 DIAGNOSIS — M069 Rheumatoid arthritis, unspecified: Secondary | ICD-10-CM | POA: Insufficient documentation

## 2012-04-15 DIAGNOSIS — Z862 Personal history of diseases of the blood and blood-forming organs and certain disorders involving the immune mechanism: Secondary | ICD-10-CM | POA: Insufficient documentation

## 2012-04-15 DIAGNOSIS — Z8742 Personal history of other diseases of the female genital tract: Secondary | ICD-10-CM | POA: Insufficient documentation

## 2012-04-15 DIAGNOSIS — I10 Essential (primary) hypertension: Secondary | ICD-10-CM | POA: Insufficient documentation

## 2012-04-15 DIAGNOSIS — B379 Candidiasis, unspecified: Secondary | ICD-10-CM | POA: Insufficient documentation

## 2012-04-15 DIAGNOSIS — E669 Obesity, unspecified: Secondary | ICD-10-CM | POA: Insufficient documentation

## 2012-04-15 DIAGNOSIS — N644 Mastodynia: Secondary | ICD-10-CM | POA: Insufficient documentation

## 2012-04-15 DIAGNOSIS — Z8619 Personal history of other infectious and parasitic diseases: Secondary | ICD-10-CM | POA: Insufficient documentation

## 2012-04-18 ENCOUNTER — Encounter: Payer: Self-pay | Admitting: Obstetrics and Gynecology

## 2012-04-18 ENCOUNTER — Ambulatory Visit (INDEPENDENT_AMBULATORY_CARE_PROVIDER_SITE_OTHER): Payer: BC Managed Care – PPO | Admitting: Obstetrics and Gynecology

## 2012-04-18 VITALS — BP 124/84 | Ht 62.25 in | Wt 290.0 lb

## 2012-04-18 DIAGNOSIS — Z8742 Personal history of other diseases of the female genital tract: Secondary | ICD-10-CM

## 2012-04-18 DIAGNOSIS — R6889 Other general symptoms and signs: Secondary | ICD-10-CM

## 2012-04-18 DIAGNOSIS — R87612 Low grade squamous intraepithelial lesion on cytologic smear of cervix (LGSIL): Secondary | ICD-10-CM

## 2012-04-18 DIAGNOSIS — N949 Unspecified condition associated with female genital organs and menstrual cycle: Secondary | ICD-10-CM

## 2012-04-18 DIAGNOSIS — Z124 Encounter for screening for malignant neoplasm of cervix: Secondary | ICD-10-CM

## 2012-04-18 DIAGNOSIS — IMO0002 Reserved for concepts with insufficient information to code with codable children: Secondary | ICD-10-CM | POA: Insufficient documentation

## 2012-04-18 DIAGNOSIS — N970 Female infertility associated with anovulation: Secondary | ICD-10-CM

## 2012-04-18 DIAGNOSIS — N912 Amenorrhea, unspecified: Secondary | ICD-10-CM

## 2012-04-18 DIAGNOSIS — R102 Pelvic and perineal pain: Secondary | ICD-10-CM

## 2012-04-18 NOTE — Progress Notes (Signed)
AEX  Pt voices no complaints today.  Last Pap: 03/02/11 WNL: No ASCUS HPV detected Regular Periods:no Contraception: ablation  Monthly Breast exam:yes Tetanus<51yrs:no Nl.Bladder Function:yes Daily BMs:yes Healthy Diet:yes Calcium:yes Mammogram:yes Date of Mammogram: 08/04/11 Exercise:yes Have often Exercise: 3x weekly Seatbelt: yes Abuse at home: no Stressful work:no Sigmoid-colonoscopy: n/A Bone Density: No PCP: Dr. Renae Gloss Change in PMH: no change  Change in FMH:no change Subjective:    Mackenzie Rivera is a 51 y.o. female, G0P0000, who presents for an annual exam.     History   Social History  . Marital Status: Single    Spouse Name: N/A    Number of Children: 0  . Years of Education: N/A   Occupational History  . collections rep    Social History Main Topics  . Smoking status: Former Smoker    Quit date: 09/14/2009  . Smokeless tobacco: None  . Alcohol Use: No     2 glasses wine on weekend  . Drug Use: No  . Sexually Active: Not Currently    Birth Control/ Protection: Other-see comments     ablation   Other Topics Concern  . None   Social History Narrative  . None    Menstrual cycle:   LMP: No LMP recorded. Patient has had an ablation.           Cycle: no menses status post endometrial ablation  The following portions of the patient's history were reviewed and updated as appropriate: allergies, current medications, past family history, past medical history, past social history, past surgical history and problem list.  Review of Systems Pertinent items are noted in HPI. Breast:Negative for breast lump,nipple discharge or nipple retraction Gastrointestinal: Negative for abdominal pain, change in bowel habits or rectal bleeding Urinary:negative   Objective:    BP 124/84  Ht 5' 2.25" (1.581 m)  Wt 290 lb (131.543 kg)  BMI 52.62 kg/m2    Weight:  Wt Readings from Last 1 Encounters:  04/18/12 290 lb (131.543 kg)          BMI: Body mass index is  52.62 kg/(m^2).  General Appearance: Alert, appropriate appearance for age. No acute distress HEENT: Grossly normal Neck / Thyroid: Supple, no masses, nodes or enlargement Lungs: clear to auscultation bilaterally Back: No CVA tenderness Breast Exam: No masses or nodes.No dimpling, nipple retraction or discharge. Cardiovascular: Regular rate and rhythm. S1, S2, no murmur Gastrointestinal: Soft, non-tender, no masses or organomegaly Pelvic Exam: External genitalia: normal general appearance Vaginal: normal mucosa without prolapse or lesions Cervix: normal appearance Adnexa: non palpable Uterus: does not feel significantly enlarged Exam limited by body habitus Rectovaginal: normal rectal, no masses Lymphatic Exam: Non-palpable nodes in neck, clavicular, axillary, or inguinal regions Skin: no rash or abnormalities Neurologic: Normal gait and speech, no tremor  Psychiatric: Alert and oriented, appropriate affect.   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

## 2012-04-20 LAB — PAP IG W/ RFLX HPV ASCU

## 2012-04-21 LAB — HUMAN PAPILLOMAVIRUS, HIGH RISK: HPV DNA High Risk: NOT DETECTED

## 2012-04-25 ENCOUNTER — Other Ambulatory Visit: Payer: Self-pay | Admitting: Gastroenterology

## 2012-05-02 ENCOUNTER — Other Ambulatory Visit: Payer: Self-pay | Admitting: Gastroenterology

## 2012-05-02 NOTE — Telephone Encounter (Signed)
NEEDS OFFICE VISIT FOR ANY FURTHER REFILLS! 

## 2012-06-23 ENCOUNTER — Other Ambulatory Visit: Payer: Self-pay | Admitting: Gastroenterology

## 2012-08-19 ENCOUNTER — Other Ambulatory Visit (HOSPITAL_COMMUNITY): Payer: Self-pay | Admitting: Internal Medicine

## 2012-08-19 ENCOUNTER — Other Ambulatory Visit: Payer: Self-pay | Admitting: Obstetrics and Gynecology

## 2012-08-19 DIAGNOSIS — Z1231 Encounter for screening mammogram for malignant neoplasm of breast: Secondary | ICD-10-CM

## 2012-08-29 ENCOUNTER — Ambulatory Visit (HOSPITAL_COMMUNITY)
Admission: RE | Admit: 2012-08-29 | Discharge: 2012-08-29 | Disposition: A | Discharge status: Home / Self Care | Payer: BC Managed Care – PPO | Source: Ambulatory Visit | Attending: Gastroenterology | Admitting: Gastroenterology

## 2012-08-29 ENCOUNTER — Encounter (HOSPITAL_COMMUNITY): Payer: Self-pay | Admitting: Gastroenterology

## 2012-08-29 ENCOUNTER — Encounter (HOSPITAL_COMMUNITY)
Admission: RE | Disposition: A | Discharge status: Home / Self Care | Payer: Self-pay | Source: Ambulatory Visit | Attending: Gastroenterology

## 2012-08-29 DIAGNOSIS — K5730 Diverticulosis of large intestine without perforation or abscess without bleeding: Secondary | ICD-10-CM | POA: Insufficient documentation

## 2012-08-29 DIAGNOSIS — M069 Rheumatoid arthritis, unspecified: Secondary | ICD-10-CM | POA: Insufficient documentation

## 2012-08-29 DIAGNOSIS — K219 Gastro-esophageal reflux disease without esophagitis: Secondary | ICD-10-CM | POA: Insufficient documentation

## 2012-08-29 DIAGNOSIS — Z1211 Encounter for screening for malignant neoplasm of colon: Secondary | ICD-10-CM | POA: Insufficient documentation

## 2012-08-29 DIAGNOSIS — K648 Other hemorrhoids: Secondary | ICD-10-CM | POA: Insufficient documentation

## 2012-08-29 DIAGNOSIS — D126 Benign neoplasm of colon, unspecified: Secondary | ICD-10-CM | POA: Insufficient documentation

## 2012-08-29 DIAGNOSIS — Z79899 Other long term (current) drug therapy: Secondary | ICD-10-CM | POA: Insufficient documentation

## 2012-08-29 DIAGNOSIS — I10 Essential (primary) hypertension: Secondary | ICD-10-CM | POA: Insufficient documentation

## 2012-08-29 DIAGNOSIS — D509 Iron deficiency anemia, unspecified: Secondary | ICD-10-CM | POA: Insufficient documentation

## 2012-08-29 HISTORY — PX: COLONOSCOPY: SHX5424

## 2012-08-29 SURGERY — COLONOSCOPY
Anesthesia: Moderate Sedation

## 2012-08-29 MED ORDER — MIDAZOLAM HCL 10 MG/2ML IJ SOLN
INTRAMUSCULAR | Status: AC
  Filled 2012-08-29: qty 2

## 2012-08-29 MED ORDER — BUTAMBEN-TETRACAINE-BENZOCAINE 2-2-14 % EX AERO
INHALATION_SPRAY | CUTANEOUS | Status: DC | PRN
  Administered 2012-08-29: 2 via TOPICAL

## 2012-08-29 MED ORDER — FENTANYL CITRATE 0.05 MG/ML IJ SOLN
INTRAMUSCULAR | Status: AC
  Filled 2012-08-29: qty 2

## 2012-08-29 MED ORDER — MIDAZOLAM HCL 10 MG/2ML IJ SOLN
INTRAMUSCULAR | Status: DC | PRN
  Administered 2012-08-29 (×3): 2 mg via INTRAVENOUS

## 2012-08-29 MED ORDER — FENTANYL CITRATE 0.05 MG/ML IJ SOLN
INTRAMUSCULAR | Status: DC | PRN
  Administered 2012-08-29 (×3): 25 ug via INTRAVENOUS

## 2012-08-29 MED ORDER — SODIUM CHLORIDE 0.9 % IV SOLN: INTRAVENOUS | Status: DC

## 2012-08-29 NOTE — Op Note (Addendum)
[facility] [address] [place], [address]   OPERATIVE PROCEDURE REPORT  PATIENT: Mackenzie Rivera, Mackenzie Rivera  MR#: [id number] BIRTHDATE: 1960-10-04 GENDER: Female ENDOSCOPIST: Dr.  Lorenza Burton, MD ASSISTANT:   Beryle Beams, technician Anthony Sar, RN PROCEDURE DATE: 08/29/2012  PRE-PROCEDURE PREPARATION: Moviprep was taken as instructed (32 ounces the night prior to the procedure and 32 ounces the morning of the procedure).  The patient was fasted for four hours prior to the procedure. PRE-PROCEDURE PHYSICAL: Patient has stable vital signs.  Neck is supple.  There is no JVD, thyromegaly or LAD.  Chest clear to auscultation.  S1 and S2 regular.  Abdomen soft, morbidly obese, non-distended, non-tender with NABS. PROCEDURE:     Colonoscopy with cold biopsies x 2. ASA CLASS:     Class IV INDICATIONS:     1.  Colorectal cancer screening   2.  Iron deficiency anemia. MEDICATIONS:     Fentanyl 25 mcg  and Versed 2 mg IV .  DESCRIPTION OF PROCEDURE: After the risks, benefits, and alternatives of the procedure were thoroughly explained [including a 10% missed rate of cancer and polyps], informed consent was obtained.  Digital rectal exam was performed.  The Pentax Colonoscope [id number]  was introduced through the anus  and advanced to the terminal ileum which was intubated for a short distance , limited by No adverse events experienced.   The quality of the prep was good. . Multiple washes were done. Small lesions could be missed. The instrument was then slowly withdrawn as the colon was fully examined.     COLON FINDINGS: Two small sessile polyps ranging between 3-55mm in size were found in the rectum and removed by cold biopsies x 2. A few scattered diverticula were noted. The resty of the colonic mucosa appeared healthy with a normal vascular pattern.  No masses or AVMs were noted.  The appendiceal orifice and the ICV were identified and photographed. Retroflexed views  revealed small internal hemorrhoids. The patient tolerated the procedure without immediate complications.  The scope was then withdrawn from the patient and the procedure terminated.  TIME TO CECUM:   09 minutes 00 seconds WITHDRAW TIME:  08 minutes 00 seconds  IMPRESSION:     1.  Two polyps ranging between 3-41mm in size were found in the rectum; removed by cold biopsies x 2. 2)  Few scattered diverticula. 2.  Small internal hemorrhoids.    RECOMMENDATIONS:     1.  Hold all NSAIDS for 2 weeks. 2.  Continue surveillance. 3.  High fiber diet with liberal fluid intake. 4.  OP follow-up is advised on a PRN basis.   REPEAT EXAM:      for colonoscopy in 5-10 years, depending on the pathology results.  If the patient has any abnormal GI symptoms in the interim, she has been advised to contact the office as soon as possible for further recommendations.   CPT CODES:     [id number], Colonoscopy with biopsies  DIAGNOSIS CODES:     280.9, V76.51  REFERRED VH:QIONGEX Haygood, M.D.  [name], M.D.  eSigned:  Dr. Lorenza Burton, MD 08/29/2012 5:44 PM Revised: 08/29/2012 5:44 PM  PATIENT NAME:  [name], [name] MR#: [id number]

## 2012-08-29 NOTE — H&P (Signed)
Mackenzie Rivera is an 51 y.o. female.   Chief Complaint: Colorectal cancer screening. HPI: Patient is here for colorectal cancer screening. She has a history of iron deficiency and acid reflux. She denies having any abdominal pain, nausea, vomiting, dysphagia or odynophagia. She is on Remicade for rheumatoid arthritis.  Past Medical History  Diagnosis Date  . Rheumatoid arthritis   . Osteoarthritis   . Obesity   . Hypertension   . Abnormal pap   . Hx of menorrhagia   . HPV in female   . H/O dysmenorrhea   . Pelvic pain in female     h/o  . Morbid obesity   . History of anemia   . Mastodynia     h/o  . Anovulation     h/o chronic   . HSV-2 infection   . GERD (gastroesophageal reflux disease)   . Yeast infection     h/o- on pap smear  . Amenorrhea     s/p ablation   Past Surgical History  Procedure Date  . Endometrial ablation w/ novasure 11/2007  . Total knee arthroplasty 08/2007    Left  . Total knee arthroplasty 07/2007    Right  . Knee arthroscopy 07/2006    Bilateral  . Gastric bypass 02/2003   Family History  Problem Relation Age of Onset  . Colon cancer Neg Hx   . Diabetes Maternal Uncle   . Diabetes Paternal Uncle   . Kidney disease Father    Social History:  reports that she quit smoking about 2 years ago. She does not have any smokeless tobacco history on file. She reports that she does not drink alcohol or use illicit drugs.  Allergies: No Known Allergies  Medications Prior to Admission  Medication Sig Dispense Refill  . atenolol (TENORMIN) 50 MG tablet Take 50 mg by mouth daily.        . calcium citrate (CALCITRATE - DOSED IN MG ELEMENTAL CALCIUM) 950 MG tablet Take 1 tablet by mouth daily.        . Cetirizine HCl (ZYRTEC ALLERGY) 10 MG CAPS Take 1 capsule by mouth daily.        . ergocalciferol (VITAMIN D2) 50000 UNITS capsule Take 50,000 Units by mouth once a week.        . Ferrous Sulfate (IRON) 325 (65 FE) MG TABS Take 1 capsule by mouth daily.         . InFLIXimab (REMICADE IV) Inject 1 application into the vein every 30 (thirty) days. Dose varies depending on patients disease.      . Multiple Vitamin (MULTIVITAMIN) tablet Take 1 tablet by mouth daily.        Marland Kitchen omeprazole (PRILOSEC) 40 MG capsule TAKE ONE CAPSULE BY MOUTH EVERY DAY  30 capsule  0  . triamterene-hydrochlorothiazide (MAXZIDE-25) 37.5-25 MG per tablet Take 1 tablet by mouth every other day.        . vitamin B-12 (CYANOCOBALAMIN) 100 MCG tablet Take 50 mcg by mouth daily.         No results found for this or any previous visit (from the past 48 hour(s)). No results found.  Review of Systems  Constitutional: Negative.   HENT: Negative.   Eyes: Negative.   Respiratory: Negative.   Cardiovascular: Negative.   Gastrointestinal: Positive for heartburn. Negative for nausea, vomiting, diarrhea, constipation and blood in stool.  Genitourinary: Negative.   Musculoskeletal: Positive for myalgias, back pain and joint pain.  Skin: Negative.   Neurological: Negative.  Endo/Heme/Allergies: Negative.   Psychiatric/Behavioral: Negative.     There were no vitals taken for this visit. Physical Exam  Constitutional: She is oriented to person, place, and time. She appears well-developed and well-nourished.  HENT:  Head: Normocephalic and atraumatic.  Eyes: Conjunctivae normal and EOM are normal. Pupils are equal, round, and reactive to light.  Neck: Normal range of motion. Neck supple.  Cardiovascular: Normal rate and regular rhythm.   Respiratory: Effort normal and breath sounds normal.  GI: Soft. Bowel sounds are normal.  Musculoskeletal: Normal range of motion.  Neurological: She is alert and oriented to person, place, and time.  Skin: Skin is warm and dry.  Psychiatric: She has a normal mood and affect. Her behavior is normal. Judgment and thought content normal.    Assessment/Plan Iron deficiency anemia/ colorectal cancer screening; proceed with an EGD/colonoscopy at this  time.  Mackenzie Rivera 08/29/2012, 1:32 PM

## 2012-08-29 NOTE — Op Note (Signed)
[facility] [address] [place], [address]   OPERATIVE PROCEDURE REPORT  PATIENT :Mackenzie Rivera, Mackenzie Rivera  MR#: [id number] BIRTHDATE :Aug 02, 1961 GENDER: Female ENDOSCOPIST: Dr.  Lorenza Burton, MD ASSISTANT:   Beryle Beams, technician & Anthony Sar, RN PROCEDURE DATE: 09-17-12  PRE-PROCEDURE PREPERATION: Patient fasted for 4 hours prior to procedure. PRE-PROCEDURE PHYSICAL: Patient has stable vital signs.  Neck is supple.  There is no JVD, thyromegaly or LAD.  Chest clear to auscultation.  S1 and S2 regular.  Abdomen soft, morbidly obese, non-distended, non-tender with NABS. PROCEDURE:     EGD, diagnostic ASA CLASS:     Class IV INDICATIONS:     Iron deficiency anemia. MEDICATIONS:     Fentanyl 50 mcg  and Versed 4 mg IV TOPICAL ANESTHETIC:   Cetacaine spary.  DESCRIPTION OF PROCEDURE: After the risks benefits and alternatives of the procedure were thoroughly explained, informed consent was obtained. The Pentax Gastroscope [id number]  was introduced through the mouth and advanced to the second portion of the duodenum , without limitations. The instrument was slowly withdrawn as the mucosa was fully examined.   The esophagus and the GEJ appeared normal. The patient had post-surgical changes consistent with Billroth I. The proximal small bowel appeared normal. There were no ulcers, erosions, masses or polyps noted. Retroflexed views revealed no abnormalities. The scope was then withdrawn from the patient and the procedure terminated. The patient tolerated the procedure without immediate complications.  IMPRESSION:  Patient is s/p Billroth I; post-op changes noted; otherwise, normal esophagogastroduodenoscopy.  RECOMMENDATIONS:     1.  Anti-reflux regimen to be followed. 2.  Avoid NSAIDS for now. 3.  Proceed with a colonoscopy.  REPEAT EXAM:  .  None planned for now.  DISCHARGE INSTRUCTIONS: Standard discharge instructions  given  _______________________________ eSigned:  Dr. Lorenza Burton, MD 17-Sep-2012 2:36 PM   CPT CODES:     [phone], EGD  DIAGNOSIS CODES:     280.9 Iron Deficiency Anemia   CC: [name], MD Dierdre Forth, MD

## 2012-08-30 ENCOUNTER — Encounter (HOSPITAL_COMMUNITY): Payer: Self-pay | Admitting: Gastroenterology

## 2012-09-08 ENCOUNTER — Ambulatory Visit (HOSPITAL_COMMUNITY)
Admission: RE | Admit: 2012-09-08 | Discharge: 2012-09-08 | Disposition: A | Discharge status: Home / Self Care | Payer: BC Managed Care – PPO | Source: Ambulatory Visit | Attending: Obstetrics and Gynecology | Admitting: Obstetrics and Gynecology

## 2012-09-08 DIAGNOSIS — Z1231 Encounter for screening mammogram for malignant neoplasm of breast: Secondary | ICD-10-CM | POA: Insufficient documentation

## 2012-09-25 ENCOUNTER — Ambulatory Visit (INDEPENDENT_AMBULATORY_CARE_PROVIDER_SITE_OTHER): Payer: BC Managed Care – PPO | Admitting: Family Medicine

## 2012-09-25 VITALS — BP 121/86 | HR 47 | Temp 98.0°F | Resp 17 | Ht 62.5 in | Wt 290.0 lb

## 2012-09-25 DIAGNOSIS — G4700 Insomnia, unspecified: Secondary | ICD-10-CM

## 2012-09-25 DIAGNOSIS — J40 Bronchitis, not specified as acute or chronic: Secondary | ICD-10-CM

## 2012-09-25 DIAGNOSIS — R05 Cough: Secondary | ICD-10-CM

## 2012-09-25 DIAGNOSIS — R059 Cough, unspecified: Secondary | ICD-10-CM

## 2012-09-25 MED ORDER — AZITHROMYCIN 250 MG PO TABS: ORAL_TABLET | ORAL | Status: DC

## 2012-09-25 MED ORDER — HYDROCODONE-HOMATROPINE 5-1.5 MG/5ML PO SYRP: ORAL_SOLUTION | ORAL | Status: DC

## 2012-09-25 NOTE — Progress Notes (Signed)
Subjective:    Patient ID: Mackenzie Rivera, female    DOB: June 22, 1961, 52 y.o.   MRN: [id number]  HPI Mackenzie Rivera is a 52 y.o. female  Started 1 week ago - cough and congestion in throat. No known fever. Persistent cough.  Less appetite - cough keeping up at night and less appetite. Insomnia due to cough.   Tx: throat lozenges, fluid.   Had Remicaide infusion 2 days ago - hx of RA.     Review of Systems  Constitutional: Negative for fever and chills.  HENT: Positive for congestion (slight bad taste. ). Negative for sneezing and sinus pressure.   Respiratory: Positive for cough (rare phlegm - clear. ). Negative for chest tightness and shortness of breath.   Cardiovascular: Positive for chest pain (sore in chest muscles with cough only - no other chest pain/heaviness.  ).  Musculoskeletal: Positive for arthralgias (chest wall with cough. ).  Hematological: Negative for adenopathy.       Objective:   Physical Exam  Constitutional: She is oriented to person, place, and time. She appears well-developed and well-nourished. No distress.  HENT:  Head: Normocephalic and atraumatic.  Right Ear: Hearing, tympanic membrane, external ear and ear canal normal.  Left Ear: Hearing, tympanic membrane, external ear and ear canal normal.  Nose: Nose normal.  Mouth/Throat: Oropharynx is clear and moist. No oropharyngeal exudate.  Eyes: Conjunctivae normal and EOM are normal. Pupils are equal, round, and reactive to light.  Cardiovascular: Normal rate, regular rhythm, normal heart sounds and intact distal pulses.   No murmur heard. Pulmonary/Chest: Effort normal and breath sounds normal. No respiratory distress. She has no wheezes. She has no rhonchi.  Neurological: She is alert and oriented to person, place, and time.  Skin: Skin is warm and dry. No rash noted.  Psychiatric: She has a normal mood and affect. Her behavior is normal.       Assessment & Plan:  Mackenzie Rivera is a 52 y.o. female 1. Cough   HYDROcodone-homatropine (HYCODAN) 5-1.5 MG/5ML syrup  2. Bronchitis  azithromycin (ZITHROMAX) 250 MG tablet  3. Insomnia  HYDROcodone-homatropine (HYCODAN) 5-1.5 MG/5ML syrup   Uri with early bronchitis, s/p Remicaide.  Start z pak, mucinex DM during day, hycodan at night if needed for insomnia with cough. rtc precautions discussed.   Patient Instructions  Start mucinex during the day for cough, tylenol for chest wall soreness, antibiotic, and syrup at night if needed for cough. Return to the clinic or go to the nearest emergency room if any of your symptoms worsen or new symptoms occur. Cough, Adult  A cough is a reflex that helps clear your throat and airways. It can help heal the body or may be a reaction to an irritated airway. A cough may only last 2 or 3 weeks (acute) or may last more than 8 weeks (chronic).  CAUSES Acute cough:  Viral or bacterial infections. Chronic cough:  Infections.  Allergies.  Asthma.  Post-nasal drip.  Smoking.  Heartburn or acid reflux.  Some medicines.  Chronic lung problems (COPD).  Cancer. SYMPTOMS   Cough.  Fever.  Chest pain.  Increased breathing rate.  High-pitched whistling sound when breathing (wheezing).  Colored mucus that you cough up (sputum). TREATMENT   A bacterial cough may be treated with antibiotic medicine.  A viral cough must run its course and will not respond to antibiotics.  Your caregiver may recommend other treatments if you have a chronic cough. HOME CARE INSTRUCTIONS   Only  take over-the-counter or prescription medicines for pain, discomfort, or fever as directed by your caregiver. Use cough suppressants only as directed by your caregiver.  Use a cold steam vaporizer or humidifier in your bedroom or home to help loosen secretions.  Sleep in a semi-upright position if your cough is worse at night.  Rest as needed.  Stop smoking if you smoke. SEEK IMMEDIATE MEDICAL CARE IF:   You have pus in your  sputum.  Your cough starts to worsen.  You cannot control your cough with suppressants and are losing sleep.  You begin coughing up blood.  You have difficulty breathing.  You develop pain which is getting worse or is uncontrolled with medicine.  You have a fever. MAKE SURE YOU:   Understand these instructions.  Will watch your condition.  Will get help right away if you are not doing well or get worse. Document Released: 02/27/2011 Document Revised: 11/23/2011 Document Reviewed: 02/27/2011 [facility] Patient Information 2013 [place], [place].

## 2012-09-25 NOTE — Patient Instructions (Signed)
Start mucinex during the day for cough, tylenol for chest wall soreness, antibiotic, and syrup at night if needed for cough. Return to the clinic or go to the nearest emergency room if any of your symptoms worsen or new symptoms occur. Cough, Adult  A cough is a reflex that helps clear your throat and airways. It can help heal the body or may be a reaction to an irritated airway. A cough may only last 2 or 3 weeks (acute) or may last more than 8 weeks (chronic).  CAUSES Acute cough:  Viral or bacterial infections. Chronic cough:  Infections.  Allergies.  Asthma.  Post-nasal drip.  Smoking.  Heartburn or acid reflux.  Some medicines.  Chronic lung problems (COPD).  Cancer. SYMPTOMS   Cough.  Fever.  Chest pain.  Increased breathing rate.  High-pitched whistling sound when breathing (wheezing).  Colored mucus that you cough up (sputum). TREATMENT   A bacterial cough may be treated with antibiotic medicine.  A viral cough must run its course and will not respond to antibiotics.  Your caregiver may recommend other treatments if you have a chronic cough. HOME CARE INSTRUCTIONS   Only take over-the-counter or prescription medicines for pain, discomfort, or fever as directed by your caregiver. Use cough suppressants only as directed by your caregiver.  Use a cold steam vaporizer or humidifier in your bedroom or home to help loosen secretions.  Sleep in a semi-upright position if your cough is worse at night.  Rest as needed.  Stop smoking if you smoke. SEEK IMMEDIATE MEDICAL CARE IF:   You have pus in your sputum.  Your cough starts to worsen.  You cannot control your cough with suppressants and are losing sleep.  You begin coughing up blood.  You have difficulty breathing.  You develop pain which is getting worse or is uncontrolled with medicine.  You have a fever. MAKE SURE YOU:   Understand these instructions.  Will watch your condition.  Will  get help right away if you are not doing well or get worse. Document Released: 02/27/2011 Document Revised: 11/23/2011 Document Reviewed: 02/27/2011 [facility] Patient Information 2013 [place], [place].

## 2013-01-09 ENCOUNTER — Ambulatory Visit
Admission: RE | Admit: 2013-01-09 | Discharge: 2013-01-09 | Disposition: A | Discharge status: Home / Self Care | Payer: BC Managed Care – PPO | Source: Ambulatory Visit | Attending: Family | Admitting: Family

## 2013-01-09 ENCOUNTER — Other Ambulatory Visit: Payer: Self-pay | Admitting: Family

## 2013-01-09 DIAGNOSIS — R059 Cough, unspecified: Secondary | ICD-10-CM

## 2013-01-09 DIAGNOSIS — R05 Cough: Secondary | ICD-10-CM

## 2013-05-29 ENCOUNTER — Other Ambulatory Visit: Payer: Self-pay | Admitting: Obstetrics and Gynecology

## 2013-08-16 ENCOUNTER — Other Ambulatory Visit (HOSPITAL_COMMUNITY): Payer: Self-pay | Admitting: Internal Medicine

## 2013-08-16 DIAGNOSIS — Z1231 Encounter for screening mammogram for malignant neoplasm of breast: Secondary | ICD-10-CM

## 2013-09-12 ENCOUNTER — Ambulatory Visit (HOSPITAL_COMMUNITY)
Admission: RE | Admit: 2013-09-12 | Discharge: 2013-09-12 | Disposition: A | Discharge status: Home / Self Care | Payer: BC Managed Care – PPO | Source: Ambulatory Visit | Attending: Internal Medicine | Admitting: Internal Medicine

## 2013-09-12 DIAGNOSIS — Z1231 Encounter for screening mammogram for malignant neoplasm of breast: Secondary | ICD-10-CM

## 2014-10-29 ENCOUNTER — Other Ambulatory Visit (HOSPITAL_COMMUNITY): Payer: Self-pay | Admitting: Internal Medicine

## 2014-10-29 DIAGNOSIS — Z1231 Encounter for screening mammogram for malignant neoplasm of breast: Secondary | ICD-10-CM

## 2014-11-07 ENCOUNTER — Ambulatory Visit (HOSPITAL_COMMUNITY)
Admission: RE | Admit: 2014-11-07 | Discharge: 2014-11-07 | Disposition: A | Discharge status: Home / Self Care | Payer: BLUE CROSS/BLUE SHIELD | Source: Ambulatory Visit | Attending: Internal Medicine | Admitting: Internal Medicine

## 2014-11-07 DIAGNOSIS — Z1231 Encounter for screening mammogram for malignant neoplasm of breast: Secondary | ICD-10-CM | POA: Diagnosis not present

## 2016-04-30 ENCOUNTER — Other Ambulatory Visit: Payer: Self-pay | Admitting: Obstetrics and Gynecology

## 2016-04-30 DIAGNOSIS — Z1231 Encounter for screening mammogram for malignant neoplasm of breast: Secondary | ICD-10-CM

## 2016-05-20 ENCOUNTER — Ambulatory Visit
Admission: RE | Admit: 2016-05-20 | Discharge: 2016-05-20 | Disposition: A | Discharge status: Home / Self Care | Payer: BLUE CROSS/BLUE SHIELD | Source: Ambulatory Visit | Attending: Obstetrics and Gynecology | Admitting: Obstetrics and Gynecology

## 2016-05-20 DIAGNOSIS — Z1231 Encounter for screening mammogram for malignant neoplasm of breast: Secondary | ICD-10-CM

## 2017-12-23 ENCOUNTER — Other Ambulatory Visit: Payer: Self-pay | Admitting: Gastroenterology

## 2017-12-24 ENCOUNTER — Encounter (HOSPITAL_COMMUNITY)
Admission: RE | Disposition: A | Discharge status: Home / Self Care | Payer: Self-pay | Source: Ambulatory Visit | Attending: Gastroenterology

## 2017-12-24 ENCOUNTER — Encounter (HOSPITAL_COMMUNITY): Payer: Self-pay | Admitting: *Deleted

## 2017-12-24 ENCOUNTER — Other Ambulatory Visit: Payer: Self-pay

## 2017-12-24 ENCOUNTER — Ambulatory Visit (HOSPITAL_COMMUNITY)
Admission: RE | Admit: 2017-12-24 | Discharge: 2017-12-24 | Disposition: A | Discharge status: Home / Self Care | Payer: BLUE CROSS/BLUE SHIELD | Source: Ambulatory Visit | Attending: Gastroenterology | Admitting: Gastroenterology

## 2017-12-24 DIAGNOSIS — K5730 Diverticulosis of large intestine without perforation or abscess without bleeding: Secondary | ICD-10-CM | POA: Diagnosis not present

## 2017-12-24 DIAGNOSIS — M069 Rheumatoid arthritis, unspecified: Secondary | ICD-10-CM | POA: Insufficient documentation

## 2017-12-24 DIAGNOSIS — I10 Essential (primary) hypertension: Secondary | ICD-10-CM | POA: Insufficient documentation

## 2017-12-24 DIAGNOSIS — Z87891 Personal history of nicotine dependence: Secondary | ICD-10-CM | POA: Insufficient documentation

## 2017-12-24 DIAGNOSIS — R197 Diarrhea, unspecified: Secondary | ICD-10-CM | POA: Insufficient documentation

## 2017-12-24 DIAGNOSIS — Z96653 Presence of artificial knee joint, bilateral: Secondary | ICD-10-CM | POA: Insufficient documentation

## 2017-12-24 DIAGNOSIS — K921 Melena: Secondary | ICD-10-CM | POA: Insufficient documentation

## 2017-12-24 DIAGNOSIS — Z9884 Bariatric surgery status: Secondary | ICD-10-CM | POA: Diagnosis not present

## 2017-12-24 DIAGNOSIS — Z980 Intestinal bypass and anastomosis status: Secondary | ICD-10-CM | POA: Diagnosis not present

## 2017-12-24 DIAGNOSIS — Z6841 Body Mass Index (BMI) 40.0 and over, adult: Secondary | ICD-10-CM | POA: Insufficient documentation

## 2017-12-24 HISTORY — PX: COLONOSCOPY: SHX5424

## 2017-12-24 HISTORY — PX: ESOPHAGOGASTRODUODENOSCOPY: SHX5428

## 2017-12-24 SURGERY — EGD (ESOPHAGOGASTRODUODENOSCOPY)
Anesthesia: Moderate Sedation

## 2017-12-24 MED ORDER — FENTANYL CITRATE (PF) 100 MCG/2ML IJ SOLN
INTRAMUSCULAR | Status: AC
  Filled 2017-12-24: qty 2

## 2017-12-24 MED ORDER — MIDAZOLAM HCL 10 MG/2ML IJ SOLN
INTRAMUSCULAR | Status: DC | PRN
  Administered 2017-12-24: 1 mg via INTRAVENOUS
  Administered 2017-12-24: 2 mg via INTRAVENOUS
  Administered 2017-12-24: 1 mg via INTRAVENOUS
  Administered 2017-12-24: 2 mg via INTRAVENOUS
  Administered 2017-12-24 (×2): 1 mg via INTRAVENOUS

## 2017-12-24 MED ORDER — MIDAZOLAM HCL 5 MG/ML IJ SOLN
INTRAMUSCULAR | Status: AC
  Filled 2017-12-24: qty 2

## 2017-12-24 MED ORDER — DIPHENHYDRAMINE HCL 50 MG/ML IJ SOLN
INTRAMUSCULAR | Status: AC
  Filled 2017-12-24: qty 1

## 2017-12-24 MED ORDER — FENTANYL CITRATE (PF) 100 MCG/2ML IJ SOLN
INTRAMUSCULAR | Status: DC | PRN
  Administered 2017-12-24 (×3): 25 ug via INTRAVENOUS

## 2017-12-24 MED ORDER — SODIUM CHLORIDE 0.9 % IV SOLN
INTRAVENOUS | Status: DC
  Administered 2017-12-24: 1000 mL via INTRAVENOUS

## 2017-12-24 NOTE — Discharge Instructions (Signed)

## 2017-12-24 NOTE — H&P (Signed)
  Mackenzie Rivera HPI: This is a 57 year old female here for further evaluatio of hematochezia and some dark stools starting 12/22/2017.  Since the onset of the bleeding she reports problems with diarrhea.  On average she will have 4 watery bowel movements per day, which is an increase from her baseline of 2 BMs per day.  There is a history of IDA and her last colonoscopy was 08/29/2012 with findings of diverticula.  Past Medical History:  Diagnosis Date  . Abnormal pap   . Amenorrhea    s/p ablation  . Anovulation    h/o chronic   . GERD (gastroesophageal reflux disease)   . H/O dysmenorrhea   . History of anemia   . HPV in female   . HSV-2 infection   . Hx of menorrhagia   . Hypertension   . Mastodynia    h/o  . Morbid obesity ([place])   . Obesity   . Osteoarthritis   . Pelvic pain in female    h/o  . Rheumatoid arthritis(714.0)   . Yeast infection    h/o- on pap smear    Past Surgical History:  Procedure Laterality Date  . COLONOSCOPY  08/29/2012   Procedure: COLONOSCOPY;  Surgeon: Juanita Craver, MD;  Location: [facility];  Service: Endoscopy;  Laterality: N/A;  . ENDOMETRIAL ABLATION W/ NOVASURE  11/2007  . GASTRIC BYPASS  02/2003  . KNEE ARTHROSCOPY  07/2006   Bilateral  . TOTAL KNEE ARTHROPLASTY  08/2007   Left  . TOTAL KNEE ARTHROPLASTY  07/2007   Right    Family History  Problem Relation Age of Onset  . Kidney disease Father   . Diabetes Maternal Uncle   . Diabetes Paternal Uncle   . Colon cancer Neg Hx     Social History:  reports that she quit smoking about 8 years ago. She has never used smokeless tobacco. She reports that she drinks alcohol. She reports that she does not use drugs.  Allergies: No Known Allergies  Medications:  Scheduled:  Continuous: . sodium chloride 1,000 mL (12/24/17 0849)    No results found for this or any previous visit (from the past 24 hour(s)).   No results found.  ROS:  As stated above in the HPI otherwise  negative.  Blood pressure (!) 126/56, pulse 71, temperature 98.5 F (36.9 C), temperature source Axillary, resp. rate 11, height 5\' 2"  (1.575 m), weight (!) 137.4 kg (303 lb), SpO2 98 %.    PE: Gen: NAD, Alert and Oriented HEENT:  [place]/AT, EOMI Neck: Supple, no LAD Lungs: CTA Bilaterally CV: RRR without M/G/R ABM: Soft, NTND, +BS Ext: No C/C/E  Assessment/Plan: 1) Hematochezia. 2) ? Melena. 3) Diarrhea.  Plan: 1) EGD/Colonoscopy.  Mackenzie Rivera D 12/24/2017, 9:15 AM

## 2017-12-24 NOTE — Op Note (Signed)
[facility] Patient Name: Mackenzie Rivera Procedure Date: 12/24/2017 MRN: [id number] Attending MD: Carol Ada , MD Date of Birth: Dec 09, 1960 CSN: [id number] Age: 57 Admit Type: Outpatient Procedure:                Upper GI endoscopy Indications:              Hematochezia, Melena Providers:                Carol Ada, MD, Elmer Ramp. Tilden Dome, RN, Laurena Spies, Technician Referring MD:              Medicines:                See the colonoscopy report. Complications:            No immediate complications. Estimated Blood Loss:     Estimated blood loss: none. Procedure:                Pre-Anesthesia Assessment:                           - Prior to the procedure, a History and Physical                            was performed, and patient medications and                            allergies were reviewed. The patient's tolerance of                            previous anesthesia was also reviewed. The risks                            and benefits of the procedure and the sedation                            options and risks were discussed with the patient.                            All questions were answered, and informed consent                            was obtained. Prior Anticoagulants: The patient has                            taken no previous anticoagulant or antiplatelet                            agents. ASA Grade Assessment: III - A patient with                            severe systemic disease. After reviewing the risks  and benefits, the patient was deemed in                            satisfactory condition to undergo the procedure.                           - Sedation was administered by an endoscopy nurse.                            The sedation level attained was moderate.                           After obtaining informed consent, the endoscope was                            passed under direct vision.  Throughout the                            procedure, the patient's blood pressure, pulse, and                            oxygen saturations were monitored continuously. The                            EC-3490LI ([id number]) scope was introduced through                            the mouth, and advanced to the efferent jejunal                            loop. The upper GI endoscopy was accomplished                            without difficulty. The patient tolerated the                            procedure well. Scope In: Scope Out: Findings:      The esophagus was normal.      Evidence of a Roux-en-Y gastrojejunostomy was found. The gastrojejunal       anastomosis was characterized by erosion. This was traversed. The       pouch-to-jejunum limb was characterized by healthy appearing mucosa.      The examined jejunum was normal.      The gastric pouch was small. The erosions were superficial and NOT the       source of the bleeding. Impression:               - Normal esophagus.                           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                           -  Normal examined jejunum.                           - No specimens collected. Moderate Sedation:      Moderate (conscious) sedation was administered by the endoscopy nurse       and supervised by the endoscopist. The patient's oxygen saturation,       heart rate, blood pressure and response to care were monitored. Recommendation:           - Patient has a contact number available for                            emergencies. The signs and symptoms of potential                            delayed complications were discussed with the                            patient. Return to normal activities tomorrow.                            Written discharge instructions were provided to the                            patient.                           - Resume  previous diet.                           - Continue present medications.                           - Proceed with the colonoscopy. Procedure Code(s):        --- Professional ---                           [phone], Esophagogastroduodenoscopy, flexible,                            transoral; diagnostic, including collection of                            specimen(s) by brushing or washing, when performed                            (separate procedure) Diagnosis Code(s):        --- Professional ---                           Z98.0, Intestinal bypass and anastomosis status                           K92.1, Melena (includes Hematochezia) CPT copyright 2017 American [employer] Association. All rights reserved. The codes documented in this report are preliminary and upon coder review may  be revised to meet current compliance requirements. Carol Ada, MD Carol Ada, MD 12/24/2017  10:22:07 AM This report has been signed electronically. Number of Addenda: 0

## 2017-12-24 NOTE — Op Note (Signed)
[name] Procedure Date: 12/24/2017 MRN: [id number] Attending MD: Carol Ada , MD Date of Birth: 01-01-1961 CSN: [id number] Age: 57 Admit Type: Outpatient Procedure:                Colonoscopy Indications:              Hematochezia Providers:                Carol Ada, MD, Elmer Ramp. Tilden Dome, RN, Laurena Spies, Technician Referring MD:              Medicines:                Midazolam 8 mg IV, Fentanyl 75 micrograms IV Complications:            No immediate complications. Estimated Blood Loss:     Estimated blood loss: none. Procedure:                Pre-Anesthesia Assessment:                           - Prior to the procedure, a History and Physical                            was performed, and patient medications and                            allergies were reviewed. The patient's tolerance of                            previous anesthesia was also reviewed. The risks                            and benefits of the procedure and the sedation                            options and risks were discussed with the patient.                            All questions were answered, and informed consent                            was obtained. Prior Anticoagulants: The patient has                            taken no previous anticoagulant or antiplatelet                            agents. ASA Grade Assessment: III - A patient with                            severe systemic disease. After reviewing the risks  and benefits, the patient was deemed in                            satisfactory condition to undergo the procedure.                           - Sedation was administered by an endoscopy nurse.                            The sedation level attained was moderate.                           After obtaining informed consent, the colonoscope                            was passed under direct  vision. Throughout the                            procedure, the patient's blood pressure, pulse, and                            oxygen saturations were monitored continuously. The                            EC-3490LI ([id number]) scope was introduced through                            the anus and advanced to the the cecum, identified                            by appendiceal orifice and ileocecal valve. The                            colonoscopy was performed without difficulty. The                            patient tolerated the procedure well. The quality                            of the bowel preparation was good. The ileocecal                            valve, appendiceal orifice, and rectum were                            photographed. Scope In: 9:51:14 AM Scope Out: 10:09:40 AM Scope Withdrawal Time: 0 hours 14 minutes 42 seconds  Total Procedure Duration: 0 hours 18 minutes 26 seconds  Findings:      Scattered small and large-mouthed diverticula were found in the sigmoid       colon, descending colon, transverse colon and ascending colon.      This is most likely a diverticular bleed as there was a small blood clot       noted at one of the diverticula. Impression:               -  Diverticulosis in the sigmoid colon, in the                            descending colon, in the transverse colon and in                            the ascending colon.                           - No specimens collected. Moderate Sedation:      Moderate (conscious) sedation was administered by the endoscopy nurse       and supervised by the endoscopist. The following parameters were       monitored: oxygen saturation, heart rate, blood pressure, and response       to care. Total physician intraservice time was 32 minutes. Recommendation:           - Patient has a contact number available for                            emergencies. The signs and symptoms of potential                            delayed  complications were discussed with the                            patient. Return to normal activities tomorrow.                            Written discharge instructions were provided to the                            patient.                           - Resume previous diet.                           - Continue present medications.                           - Repeat colonoscopy in 10 years for surveillance. Procedure Code(s):        --- Professional ---                           [phone], Colonoscopy, flexible; diagnostic, including                            collection of specimen(s) by brushing or washing,                            when performed (separate procedure) Diagnosis Code(s):        --- Professional ---                           K92.1, Melena (includes Hematochezia)  K57.30, Diverticulosis of large intestine without                            perforation or abscess without bleeding CPT copyright 2017 American [employer] Association. All rights reserved. The codes documented in this report are preliminary and upon coder review may  be revised to meet current compliance requirements. Carol Ada, MD Carol Ada, MD 12/24/2017 10:17:47 AM This report has been signed electronically. Number of Addenda: 0

## 2017-12-25 ENCOUNTER — Encounter (HOSPITAL_COMMUNITY): Payer: Self-pay | Admitting: Gastroenterology

## 2018-01-13 ENCOUNTER — Other Ambulatory Visit: Payer: Self-pay | Admitting: Gastroenterology

## 2018-01-13 DIAGNOSIS — R1011 Right upper quadrant pain: Secondary | ICD-10-CM

## 2018-01-24 ENCOUNTER — Ambulatory Visit (HOSPITAL_COMMUNITY)
Admission: RE | Admit: 2018-01-24 | Discharge: 2018-01-24 | Disposition: A | Discharge status: Home / Self Care | Payer: BLUE CROSS/BLUE SHIELD | Source: Ambulatory Visit | Attending: Gastroenterology | Admitting: Gastroenterology

## 2018-01-24 ENCOUNTER — Other Ambulatory Visit (HOSPITAL_COMMUNITY): Payer: BLUE CROSS/BLUE SHIELD

## 2018-01-24 DIAGNOSIS — R1011 Right upper quadrant pain: Secondary | ICD-10-CM

## 2018-01-24 MED ORDER — TECHNETIUM TC 99M MEBROFENIN IV KIT
5.3000 | PACK | Freq: Once | INTRAVENOUS | Status: AC | PRN
  Administered 2018-01-24: 5.3 via INTRAVENOUS

## 2020-03-13 ENCOUNTER — Encounter (HOSPITAL_COMMUNITY): Payer: Self-pay | Admitting: *Deleted

## 2020-03-13 ENCOUNTER — Ambulatory Visit (INDEPENDENT_AMBULATORY_CARE_PROVIDER_SITE_OTHER)
Admission: EM | Admit: 2020-03-13 | Discharge: 2020-03-13 | Disposition: A | Discharge status: Other Acute Inpatient Hospital | Payer: Medicare Other | Source: Home / Self Care | Attending: Family Medicine | Admitting: Family Medicine

## 2020-03-13 ENCOUNTER — Encounter (HOSPITAL_COMMUNITY): Payer: Self-pay

## 2020-03-13 ENCOUNTER — Inpatient Hospital Stay (HOSPITAL_COMMUNITY)
Admission: EM | Admit: 2020-03-13 | Discharge: 2020-03-15 | DRG: 378 | Disposition: A | Discharge status: Home / Self Care | Payer: Medicare Other | Attending: Internal Medicine | Admitting: Internal Medicine

## 2020-03-13 ENCOUNTER — Other Ambulatory Visit: Payer: Self-pay

## 2020-03-13 DIAGNOSIS — Z20822 Contact with and (suspected) exposure to covid-19: Secondary | ICD-10-CM | POA: Diagnosis present

## 2020-03-13 DIAGNOSIS — I159 Secondary hypertension, unspecified: Secondary | ICD-10-CM

## 2020-03-13 DIAGNOSIS — D649 Anemia, unspecified: Secondary | ICD-10-CM

## 2020-03-13 DIAGNOSIS — Z841 Family history of disorders of kidney and ureter: Secondary | ICD-10-CM

## 2020-03-13 DIAGNOSIS — Z934 Other artificial openings of gastrointestinal tract status: Secondary | ICD-10-CM

## 2020-03-13 DIAGNOSIS — M069 Rheumatoid arthritis, unspecified: Secondary | ICD-10-CM | POA: Diagnosis present

## 2020-03-13 DIAGNOSIS — E876 Hypokalemia: Secondary | ICD-10-CM | POA: Diagnosis present

## 2020-03-13 DIAGNOSIS — Z96653 Presence of artificial knee joint, bilateral: Secondary | ICD-10-CM | POA: Diagnosis present

## 2020-03-13 DIAGNOSIS — K922 Gastrointestinal hemorrhage, unspecified: Secondary | ICD-10-CM

## 2020-03-13 DIAGNOSIS — Z9884 Bariatric surgery status: Secondary | ICD-10-CM

## 2020-03-13 DIAGNOSIS — Z79899 Other long term (current) drug therapy: Secondary | ICD-10-CM | POA: Diagnosis not present

## 2020-03-13 DIAGNOSIS — Z87891 Personal history of nicotine dependence: Secondary | ICD-10-CM | POA: Diagnosis not present

## 2020-03-13 DIAGNOSIS — K5790 Diverticulosis of intestine, part unspecified, without perforation or abscess without bleeding: Secondary | ICD-10-CM | POA: Diagnosis present

## 2020-03-13 DIAGNOSIS — D62 Acute posthemorrhagic anemia: Secondary | ICD-10-CM | POA: Diagnosis present

## 2020-03-13 DIAGNOSIS — D509 Iron deficiency anemia, unspecified: Secondary | ICD-10-CM | POA: Diagnosis present

## 2020-03-13 DIAGNOSIS — K254 Chronic or unspecified gastric ulcer with hemorrhage: Secondary | ICD-10-CM | POA: Diagnosis present

## 2020-03-13 DIAGNOSIS — K284 Chronic or unspecified gastrojejunal ulcer with hemorrhage: Principal | ICD-10-CM | POA: Diagnosis present

## 2020-03-13 DIAGNOSIS — Z862 Personal history of diseases of the blood and blood-forming organs and certain disorders involving the immune mechanism: Secondary | ICD-10-CM | POA: Diagnosis not present

## 2020-03-13 DIAGNOSIS — Z833 Family history of diabetes mellitus: Secondary | ICD-10-CM | POA: Diagnosis not present

## 2020-03-13 DIAGNOSIS — I959 Hypotension, unspecified: Secondary | ICD-10-CM

## 2020-03-13 DIAGNOSIS — I10 Essential (primary) hypertension: Secondary | ICD-10-CM | POA: Diagnosis present

## 2020-03-13 LAB — CBC
HCT: 16.4 % — ABNORMAL LOW (ref 36.0–46.0)
HCT: 15.7 % — ABNORMAL LOW (ref 36.0–46.0)
HCT: 19.5 % — ABNORMAL LOW (ref 36.0–46.0)
Hemoglobin: 4.9 g/dL — CL (ref 12.0–15.0)
Hemoglobin: 4.6 g/dL — CL (ref 12.0–15.0)
Hemoglobin: 6 g/dL — CL (ref 12.0–15.0)
MCH: 22.8 pg — ABNORMAL LOW (ref 26.0–34.0)
MCH: 22.4 pg — ABNORMAL LOW (ref 26.0–34.0)
MCH: 24.3 pg — ABNORMAL LOW (ref 26.0–34.0)
MCHC: 29.9 g/dL — ABNORMAL LOW (ref 30.0–36.0)
MCHC: 29.3 g/dL — ABNORMAL LOW (ref 30.0–36.0)
MCHC: 30.8 g/dL (ref 30.0–36.0)
MCV: 76.3 fL — ABNORMAL LOW (ref 80.0–100.0)
MCV: 76.6 fL — ABNORMAL LOW (ref 80.0–100.0)
MCV: 78.9 fL — ABNORMAL LOW (ref 80.0–100.0)
Platelets: 228 10*3/uL (ref 150–400)
Platelets: 217 10*3/uL (ref 150–400)
Platelets: 180 10*3/uL (ref 150–400)
RBC: 2.15 MIL/uL — ABNORMAL LOW (ref 3.87–5.11)
RBC: 2.05 MIL/uL — ABNORMAL LOW (ref 3.87–5.11)
RBC: 2.47 MIL/uL — ABNORMAL LOW (ref 3.87–5.11)
RDW: 17.2 % — ABNORMAL HIGH (ref 11.5–15.5)
RDW: 17.1 % — ABNORMAL HIGH (ref 11.5–15.5)
RDW: 17.5 % — ABNORMAL HIGH (ref 11.5–15.5)
WBC: 4.3 10*3/uL (ref 4.0–10.5)
WBC: 4.6 10*3/uL (ref 4.0–10.5)
WBC: 5.2 10*3/uL (ref 4.0–10.5)
nRBC: 0 % (ref 0.0–0.2)
nRBC: 0 % (ref 0.0–0.2)
nRBC: 0 % (ref 0.0–0.2)

## 2020-03-13 LAB — COMPREHENSIVE METABOLIC PANEL
ALT: 14 U/L (ref 0–44)
ALT: 13 U/L (ref 0–44)
AST: 18 U/L (ref 15–41)
AST: 17 U/L (ref 15–41)
Albumin: 3.4 g/dL — ABNORMAL LOW (ref 3.5–5.0)
Albumin: 3.2 g/dL — ABNORMAL LOW (ref 3.5–5.0)
Alkaline Phosphatase: 48 U/L (ref 38–126)
Alkaline Phosphatase: 43 U/L (ref 38–126)
Anion gap: 8 (ref 5–15)
Anion gap: 10 (ref 5–15)
BUN: 18 mg/dL (ref 6–20)
BUN: 18 mg/dL (ref 6–20)
CO2: 22 mmol/L (ref 22–32)
CO2: 20 mmol/L — ABNORMAL LOW (ref 22–32)
Calcium: 8.7 mg/dL — ABNORMAL LOW (ref 8.9–10.3)
Calcium: 8.8 mg/dL — ABNORMAL LOW (ref 8.9–10.3)
Chloride: 106 mmol/L (ref 98–111)
Chloride: 106 mmol/L (ref 98–111)
Creatinine, Ser: 0.92 mg/dL (ref 0.44–1.00)
Creatinine, Ser: 0.87 mg/dL (ref 0.44–1.00)
GFR calc Af Amer: >60 mL/min (ref >60)
GFR calc Af Amer: >60 mL/min (ref >60)
GFR calc non Af Amer: >60 mL/min (ref >60)
GFR calc non Af Amer: >60 mL/min (ref >60)
Glucose, Bld: 99 mg/dL (ref 70–99)
Glucose, Bld: 93 mg/dL (ref 70–99)
Potassium: 3.3 mmol/L — ABNORMAL LOW (ref 3.5–5.1)
Potassium: 3.2 mmol/L — ABNORMAL LOW (ref 3.5–5.1)
Sodium: 136 mmol/L (ref 135–145)
Sodium: 136 mmol/L (ref 135–145)
Total Bilirubin: 0.6 mg/dL (ref 0.3–1.2)
Total Bilirubin: 0.5 mg/dL (ref 0.3–1.2)
Total Protein: 5.7 g/dL — ABNORMAL LOW (ref 6.5–8.1)
Total Protein: 5.5 g/dL — ABNORMAL LOW (ref 6.5–8.1)

## 2020-03-13 LAB — RETICULOCYTES
Immature Retic Fract: 31.8 % — ABNORMAL HIGH (ref 2.3–15.9)
RBC.: 1.84 MIL/uL — ABNORMAL LOW (ref 3.87–5.11)
Retic Count, Absolute: 48.4 10*3/uL (ref 19.0–186.0)
Retic Ct Pct: 2.6 % (ref 0.4–3.1)

## 2020-03-13 LAB — SARS CORONAVIRUS 2 BY RT PCR (HOSPITAL ORDER, PERFORMED IN ~~LOC~~ HOSPITAL LAB): SARS Coronavirus 2: NEGATIVE

## 2020-03-13 LAB — IRON AND TIBC
Iron: 6 ug/dL — ABNORMAL LOW (ref 28–170)
Saturation Ratios: 1 % — ABNORMAL LOW (ref 10.4–31.8)
TIBC: 445 ug/dL (ref 250–450)
UIBC: 439 ug/dL

## 2020-03-13 LAB — ABO/RH: ABO/RH(D): A POS

## 2020-03-13 LAB — PREPARE RBC (CROSSMATCH)

## 2020-03-13 LAB — PROTIME-INR
INR: 1.2 (ref 0.8–1.2)
Prothrombin Time: 15 seconds (ref 11.4–15.2)

## 2020-03-13 LAB — HIV ANTIBODY (ROUTINE TESTING W REFLEX): HIV Screen 4th Generation wRfx: NONREACTIVE

## 2020-03-13 LAB — FERRITIN: Ferritin: 3 ng/mL — ABNORMAL LOW (ref 11–307)

## 2020-03-13 LAB — FOLATE: Folate: 22.2 ng/mL (ref >5.9)

## 2020-03-13 LAB — POC OCCULT BLOOD, ED: Fecal Occult Bld: POSITIVE — AB

## 2020-03-13 LAB — VITAMIN B12: Vitamin B-12: 234 pg/mL (ref 180–914)

## 2020-03-13 MED ORDER — SODIUM CHLORIDE 0.9 % IV SOLN
80.0000 mg | Freq: Once | INTRAVENOUS | Status: AC
  Administered 2020-03-13: 80 mg via INTRAVENOUS
  Filled 2020-03-13: qty 80

## 2020-03-13 MED ORDER — SODIUM CHLORIDE 0.9 % IV SOLN: INTRAVENOUS | Status: DC

## 2020-03-13 MED ORDER — ACETAMINOPHEN 650 MG RE SUPP: 650.0000 mg | Freq: Four times a day (QID) | RECTAL | Status: DC | PRN

## 2020-03-13 MED ORDER — PANTOPRAZOLE SODIUM 40 MG IV SOLR
40.0000 mg | Freq: Once | INTRAVENOUS | Status: DC
  Filled 2020-03-13: qty 40

## 2020-03-13 MED ORDER — SODIUM CHLORIDE 0.9 % IV BOLUS
1000.0000 mL | Freq: Once | INTRAVENOUS | Status: AC
  Administered 2020-03-13: 1000 mL via INTRAVENOUS

## 2020-03-13 MED ORDER — VITAMIN B-12 100 MCG PO TABS
50.0000 ug | ORAL_TABLET | Freq: Every day | ORAL | Status: DC
  Administered 2020-03-13 – 2020-03-15 (×3): 50 ug via ORAL
  Filled 2020-03-13 (×3): qty 1

## 2020-03-13 MED ORDER — SODIUM CHLORIDE 0.9 % IV SOLN
8.0000 mg/h | INTRAVENOUS | Status: DC
  Administered 2020-03-13 – 2020-03-15 (×5): 8 mg/h via INTRAVENOUS
  Filled 2020-03-13 (×8): qty 80

## 2020-03-13 MED ORDER — SODIUM CHLORIDE 0.9 % IV SOLN
10.0000 mL/h | Freq: Once | INTRAVENOUS | Status: AC
  Administered 2020-03-13: 10 mL/h via INTRAVENOUS

## 2020-03-13 MED ORDER — SODIUM CHLORIDE 0.9% IV SOLUTION: Freq: Once | INTRAVENOUS | Status: AC

## 2020-03-13 MED ORDER — ACETAMINOPHEN 325 MG PO TABS: 650.0000 mg | ORAL_TABLET | Freq: Four times a day (QID) | ORAL | Status: DC | PRN

## 2020-03-13 NOTE — ED Notes (Signed)
Hgb. 4.9

## 2020-03-13 NOTE — ED Notes (Signed)
Patient is being discharged from the Urgent [place] and sent to the Emergency Department via wheelchair by staff. Per [facility], patient is stable but in need of higher level of care due to hgb 4.9. Patient is aware and verbalizes understanding of plan of care.  Vitals:   03/13/20 0915  BP: (!) 98/42  Pulse: 70  Resp: 16  Temp: (!) 97.3 F (36.3 C)  SpO2: 100%

## 2020-03-13 NOTE — ED Triage Notes (Signed)
Pt c/o dark blood in stool and dizzinessx4 days. Pt states she has a hx of a bleeding stomach ulcer. Pt needed assistance to exam room. Pt's nail beds and oral mucous are pale.

## 2020-03-13 NOTE — ED Provider Notes (Signed)
[facility] EMERGENCY DEPARTMENT Provider Note   CSN: [id number] Arrival date & time: 03/13/20  1040     History Chief Complaint  Patient presents with  . Abdominal Pain    Mackenzie Rivera is a 59 y.o. female with past medical history significant for RA on Actemra, hypertension, osteoarthritis, amenorrhea status post ablation, GERD, gastric bypass Roux-en-Y in 2004.  HPI Patient presents to emergency department today with chief complaint of upper abdominal pain and black stool x4 days.  Patient states she has a history of a GI bleed from an ulcer approximately 1 year ago.  She had an endoscopy and colonoscopy with Dr. Collene Mares showing the ulcer.  At that time did not require blood transfusion.  Patient states when her abdominal pain started she had a leftover prescription for Carafate that she started taking and that helped improve her abdominal pain.  She states her pain was located in bilateral upper quadrants.  The pain was intermittent and a cramping sensation.  She rated the pain 4 of 10 in severity.  She has no abdominal pain currently.  She is reporting black stool.  She has had 2 episodes today of black tarry stool.  She is also endorsing craving ice, dizziness and intermittent shortness of breath, decreased appetite. Patient denies any NSAID use and alcohol use. She states her dizziness is mostly when changing positions and describes it as the room spinning.  She denies any falls.  She denies any fever, chills, emesis, hemoptysis, chest pain, back pain, urinary symptoms, lower extremity edema, numbness, weakness. Patient had both covid vaccinations.    Past Medical History:  Diagnosis Date  . Abnormal pap   . Amenorrhea    s/p ablation  . Anovulation    h/o chronic   . GERD (gastroesophageal reflux disease)   . H/O dysmenorrhea   . History of anemia   . HPV in female   . HSV-2 infection   . Hx of menorrhagia   . Hypertension   . Mastodynia    h/o  . Morbid  obesity ([place])   . Obesity   . Osteoarthritis   . Pelvic pain in female    h/o  . Rheumatoid arthritis(714.0)   . Yeast infection    h/o- on pap smear    Patient Active Problem List   Diagnosis Date Noted  . 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 Abnormal pap   . Hypertension   . Obesity   . Osteoarthritis   . Rheumatoid arthritis(714.0)   . Hx of menorrhagia   . HPV in female   . H/O dysmenorrhea   . Pelvic pain in female   . Morbid obesity ([place])   . History of anemia   . Mastodynia   . Anovulation   . HSV-2 infection   . Hx of candidal vulvovaginitis   . GERD (gastroesophageal reflux disease)   . Yeast infection   . Amenorrhea     Past Surgical History:  Procedure Laterality Date  . COLONOSCOPY  08/29/2012   Procedure: COLONOSCOPY;  Surgeon: Juanita Craver, MD;  Location: [facility];  Service: Endoscopy;  Laterality: N/A;  . COLONOSCOPY N/A 12/24/2017   Procedure: COLONOSCOPY;  Surgeon: Carol Ada, MD;  Location: [facility];  Service: Endoscopy;  Laterality: N/A;  . ENDOMETRIAL ABLATION W/ NOVASURE  11/2007  . ESOPHAGOGASTRODUODENOSCOPY N/A 12/24/2017   Procedure: ESOPHAGOGASTRODUODENOSCOPY (EGD);  Surgeon: Carol Ada, MD;  Location: [facility];  Service: Endoscopy;  Laterality: N/A;  . GASTRIC  BYPASS  02/2003  . KNEE ARTHROSCOPY  07/2006   Bilateral  . TOTAL KNEE ARTHROPLASTY  08/2007   Left  . TOTAL KNEE ARTHROPLASTY  07/2007   Right     OB History    Gravida  0   Para  0   Term  0   Preterm  0   AB  0   Living  0     SAB  0   TAB  0   Ectopic  0   Multiple  0   Live Births              Family History  Problem Relation Age of Onset  . Kidney disease Father   . Diabetes Maternal Uncle   . Diabetes Paternal Uncle   . Colon cancer Neg Hx     Social History   Tobacco Use  . Smoking status: Former Smoker    Quit date: 09/14/2009    Years since quitting: 10.5  . Smokeless tobacco: Never Used    Vaping Use  . Vaping Use: Never used  Substance Use Topics  . Alcohol use: Yes    Comment: 2 glasses wine on weekend  . Drug use: No    Home Medications Prior to Admission medications   Medication Sig Start Date End Date Taking? Authorizing Provider  atenolol (TENORMIN) 50 MG tablet Take 50 mg by mouth daily.      [provider]  calcium citrate (CALCITRATE - DOSED IN MG ELEMENTAL CALCIUM) 950 MG tablet Take 1 tablet by mouth daily.      [provider]  Cetirizine HCl (ZYRTEC ALLERGY) 10 MG CAPS Take 1 capsule by mouth daily.      [provider]  DUEXIS 800-26.6 MG TABS Take 1 tablet by mouth 3 (three) times daily as needed.  12/06/17   [provider]  Ergocalciferol 2000 units TABS Take 2,000 Units by mouth daily.     [provider]  Ferrous Sulfate (IRON) 325 (65 FE) MG TABS Take 1 capsule by mouth daily.      [provider]  fluticasone (FLONASE) 50 MCG/ACT nasal spray Place 1 spray into both nostrils daily. 09/16/17   [provider]  InFLIXimab (REMICADE IV) Inject 1 application into the vein every 30 (thirty) days. Dose varies depending on patients disease.    [provider]  polyethylene glycol-electrolytes (NULYTELY/GOLYTELY) 420 g solution Take 4,000 mLs by mouth once.    [provider]  sucralfate (CARAFATE) 1 g tablet Take 1 g by mouth 4 (four) times daily -  with meals and at bedtime.    [provider]  Tocilizumab (ACTEMRA IV) Inject into the vein. Every 4 weeks.[facility] . Dr .Amil Amen    [provider]  triamterene-hydrochlorothiazide (MAXZIDE-25) 37.5-25 MG per tablet Take 1 tablet by mouth every other day.      [provider]  vitamin B-12 (CYANOCOBALAMIN) 100 MCG tablet Take 50 mcg by mouth daily.      [provider]    Allergies    Patient has no known allergies.  Review of Systems   Review of Systems All other systems are  reviewed and are negative for acute change except as noted in the HPI.  Physical Exam Updated Vital Signs BP (!) 92/57 (BP Location: Left Arm)   Pulse 68   Temp 97.9 F (36.6 C) (Oral)   Resp 16   SpO2 100%   Physical Exam Vitals and nursing  note reviewed.  Constitutional:      General: She is not in acute distress.    Appearance: She is not ill-appearing.  HENT:     Head: Normocephalic and atraumatic.     Comments: Pale conjunctiva     Right Ear: Tympanic membrane and external ear normal.     Left Ear: Tympanic membrane and external ear normal.     Nose: Nose normal.     Mouth/Throat:     Mouth: Mucous membranes are moist.     Pharynx: Oropharynx is clear.  Eyes:     General: No scleral icterus.       Right eye: No discharge.        Left eye: No discharge.     Extraocular Movements: Extraocular movements intact.     Conjunctiva/sclera: Conjunctivae normal.     Pupils: Pupils are equal, round, and reactive to light.  Neck:     Vascular: No JVD.  Cardiovascular:     Rate and Rhythm: Normal rate and regular rhythm.     Pulses: Normal pulses.          Radial pulses are 2+ on the right side and 2+ on the left side.     Heart sounds: Normal heart sounds.  Pulmonary:     Comments: Lungs clear to auscultation in all fields. Symmetric chest rise. No wheezing, rales, or rhonchi. Abdominal:     Comments: Abdomen is soft, non-distended, and non-tender in all quadrants. No rigidity, no guarding. No peritoneal signs.  Genitourinary:    Comments: Research officer, [employer] present for exam. Digital Rectal Exam reveals sphincter with good tone. No external hemorrhoids. No masses or fissures. Stool color is light brown with no overt blood. No gross melena.  Musculoskeletal:        General: Normal range of motion.     Cervical back: Normal range of motion.  Skin:    General: Skin is warm and dry.     Capillary Refill: Capillary refill takes less than 2 seconds.     Coloration: Skin is  pale.  Neurological:     Mental Status: She is oriented to person, place, and time.     GCS: GCS eye subscore is 4. GCS verbal subscore is 5. GCS motor subscore is 6.     Comments: Fluent speech, no facial droop.  Psychiatric:        Behavior: Behavior normal.     ED Results / Procedures / Treatments   Labs (all labs ordered are listed, but only abnormal results are displayed) Labs Reviewed  COMPREHENSIVE METABOLIC PANEL - Abnormal; Notable for the following components:      Result Value   Potassium 3.2 (*)    CO2 20 (*)    Calcium 8.8 (*)    Total Protein 5.5 (*)    Albumin 3.2 (*)    All other components within normal limits  CBC - Abnormal; Notable for the following components:   RBC 2.05 (*)    Hemoglobin 4.6 (*)    HCT 15.7 (*)    MCV 76.6 (*)    MCH 22.4 (*)    MCHC 29.3 (*)    RDW 17.1 (*)    All other components within normal limits  POC OCCULT BLOOD, ED - Abnormal; Notable for the following components:   Fecal Occult Bld POSITIVE (*)    All other components within normal limits  SARS CORONAVIRUS 2 BY RT PCR (HOSPITAL ORDER, [facility])  PROTIME-INR  VITAMIN B12  FOLATE  IRON AND TIBC  FERRITIN  RETICULOCYTES  TYPE AND SCREEN  ABO/RH  PREPARE RBC (CROSSMATCH)    EKG None  Radiology No results found.  Procedures .Critical Care Performed by: Cherre Robins, PA-C Authorized by: Cherre Robins, PA-C   Critical care provider statement:    Critical care time (minutes):  40   Critical care was time spent personally by me on the following activities:  Discussions with consultants, evaluation of patient's response to treatment, examination of patient, ordering and performing treatments and interventions, ordering and review of laboratory studies, ordering and review of radiographic studies, pulse oximetry, re-evaluation of patient's condition, obtaining history from patient or surrogate, review of old charts and  development of treatment plan with patient or surrogate   (including critical care time)  Medications Ordered in ED Medications  pantoprazole (PROTONIX) 80 mg in sodium chloride 0.9 % 100 mL (0.8 mg/mL) infusion (has no administration in time range)  sodium chloride 0.9 % bolus 1,000 mL (1,000 mLs Intravenous New Bag/Given 03/13/20 1247)  pantoprazole (PROTONIX) 80 mg in sodium chloride 0.9 % 100 mL IVPB (80 mg Intravenous New Bag/Given 03/13/20 1341)  0.9 %  sodium chloride infusion (10 mL/hr Intravenous New Bag/Given 03/13/20 1344)    ED Course  I have reviewed the triage vital signs and the nursing notes.  Pertinent labs & imaging results that were available during my care of the patient were reviewed by me and considered in my medical decision making (see chart for details).  Clinical Course as of Mar 13 1405  Wed Mar 13, 2020  1337 Case discussed with Dr. Collene Mares who will come evaluate patient   [KA]    Clinical Course User Index [KA] Brittane Grudzinski, Harley Hallmark, PA-C   . Vitals:   03/13/20 1315 03/13/20 1330 03/13/20 1345 03/13/20 1346  BP: (!) 97/53 (!) 94/48  (!) 94/47  Pulse: 69 68 75 69  Resp: 12 10 20 11   Temp:    98.1 F (36.7 C)  TempSrc:    Oral  SpO2: 100% 98% 100% 100%    MDM Rules/Calculators/A&P                          History provided by patient with additional history obtained from chart review.    Patient presents to the ED with complaints of abdominal pain and black tarry stools x 4 days. She has history of stomach ulcer. Patient nontoxic appearing, in no apparent distress, blood pressure is soft 92/57 on arrival. On exam patient has no abdominal tenderness, no peritoneal signs.  Rectal exam performed with chaperone present.  Patient had light brown stool, no gross melena on exam.  Labs were collected in triage which showed no leukocytosis, hemoglobin of 4.6, no severe electrolyte derangement, no renal insufficiency, normal anion gap.  IV fluids ordered as well as  Protonix bolus and infusion. Fecal occult is positive. Type and screen resulted. 2 units of blood ordered. EKG without ischemia. This case was discussed with Dr. Billy Fischer who agrees with plan to admit. Consulted GI and case discussed with Dr. Collene Mares. She reviewed patient's chart and will come evaluate patient. Spoke with Dr. Waldron Labs with hospitalist service who agrees to assume care of patient and bring into the hospital for further evaluation and management.     Portions of this note were generated with Lobby[employer]. Dictation errors may occur despite best attempts at proofreading.  Final Clinical Impression(s) / ED Diagnoses Final diagnoses:  Gastrointestinal hemorrhage, unspecified gastrointestinal hemorrhage type    Rx / DC Orders ED Discharge Orders    None       Cherre Robins, PA-C 03/13/20 1433    Gareth Morgan, MD 03/14/20 2129

## 2020-03-13 NOTE — H&P (Signed)
[facility] H&P   Patient Demographics:    Kirsi Hugh, is a 59 y.o. female  MRN: [id number]   DOB - 08/18/1961  Admit Date - 03/13/2020  Outpatient Primary MD for the patient is Willey Blade, MD  Referring MD/NP/PA: PA Albrizze.  Outpatient Specialists: DR Collene Mares  Patient coming from: Home  Chief Complaint  Patient presents with  . Abdominal Pain      HPI:    Lachelle Rissler  is a 59 y.o. female, with past medical history significant for maternal arthritis, on monthly Actemra injections, hypertension, osteoarthritis, aminuria status post ablation, GERD, history of gastric bypass Roux-en-Y in 2004, patient presents to ED secondary to complaints of lightheadedness and dizziness, as well she does report black stool/melena for 4 days, patient states history of GI bleed, with diagnosis of gastric ulcer by Dr. Collene Mares in April 2019 via endoscopy, she denies any history of anemia or blood transfusions in the past, though she does report abdominal pain over the last 4 days as well, reports Carafate did help with her abdominal pain, ports pain is cramping sensation, 4 out of 10 in severity, currently resolved, reported her dark color stool is improving, it is brown in color over last 24 hours, he denies any NSAID use, aspirin, or any anticoagulation. - in ED hemoglobin was significantly low at 4.7, systolic blood pressure in the mid 90s, was Hemoccult positive, there is no baseline hemoglobin in our records, but patient reports she is getting a blood labs every 2 months for her Actemra injections, and she was never told she has anemia, patient was ordered 2 units PRBC transfusions, she was started on Protonix drip, [facility] hospitalist consulted to admit.    Review of systems:    In addition to the HPI above,  No Fever-chills, he does report lightheadedness and dizziness No Headache, No changes with Vision  or hearing, No problems swallowing food or Liquids, No Chest pain, Cough or Shortness of Breath, No Abdominal pain, No Nausea or Vommitting, Bowel movements are regular, She does report melena No dysuria, No new skin rashes or bruises, No new joints pains-aches,  No new weakness, tingling, numbness in any extremity, No recent weight gain or loss, No polyuria, polydypsia or polyphagia, No significant Mental Stressors.  A full 10 point Review of Systems was done, except as stated above, all other Review of Systems were negative.   With Past History of the following :    Past Medical History:  Diagnosis Date  . Abnormal pap   . Amenorrhea    s/p ablation  . Anovulation    h/o chronic   . GERD (gastroesophageal reflux disease)   . H/O dysmenorrhea   . History of anemia   . HPV in female   . HSV-2 infection   . Hx of menorrhagia   . Hypertension   . Mastodynia    h/o  .  Morbid obesity ([place])   . Obesity   . Osteoarthritis   . Pelvic pain in female    h/o  . Rheumatoid arthritis(714.0)   . Yeast infection    h/o- on pap smear      Past Surgical History:  Procedure Laterality Date  . COLONOSCOPY  08/29/2012   Procedure: COLONOSCOPY;  Surgeon: Juanita Craver, MD;  Location: [facility];  Service: Endoscopy;  Laterality: N/A;  . COLONOSCOPY N/A 12/24/2017   Procedure: COLONOSCOPY;  Surgeon: Carol Ada, MD;  Location: [facility];  Service: Endoscopy;  Laterality: N/A;  . ENDOMETRIAL ABLATION W/ NOVASURE  11/2007  . ESOPHAGOGASTRODUODENOSCOPY N/A 12/24/2017   Procedure: ESOPHAGOGASTRODUODENOSCOPY (EGD);  Surgeon: Carol Ada, MD;  Location: [facility];  Service: Endoscopy;  Laterality: N/A;  . GASTRIC BYPASS  02/2003  . KNEE ARTHROSCOPY  07/2006   Bilateral  . TOTAL KNEE ARTHROPLASTY  08/2007   Left  . TOTAL KNEE ARTHROPLASTY  07/2007   Right      Social History:     Social History   Tobacco Use  . Smoking status: Former Smoker    Quit date: 09/14/2009     Years since quitting: 10.5  . Smokeless tobacco: Never Used  Substance Use Topics  . Alcohol use: Yes    Comment: 2 glasses wine on weekend        Family History :     Family History  Problem Relation Age of Onset  . Kidney disease Father   . Diabetes Maternal Uncle   . Diabetes Paternal Uncle   . Colon cancer Neg Hx       Home Medications:   Prior to Admission medications   Medication Sig Start Date End Date Taking? Authorizing Provider  atenolol (TENORMIN) 50 MG tablet Take 50 mg by mouth daily.      [provider]  calcium citrate (CALCITRATE - DOSED IN MG ELEMENTAL CALCIUM) 950 MG tablet Take 1 tablet by mouth daily.      [provider]  Cetirizine HCl (ZYRTEC ALLERGY) 10 MG CAPS Take 1 capsule by mouth daily.      [provider]  DUEXIS 800-26.6 MG TABS Take 1 tablet by mouth 3 (three) times daily as needed.  12/06/17   [provider]  Ergocalciferol 2000 units TABS Take 2,000 Units by mouth daily.     [provider]  Ferrous Sulfate (IRON) 325 (65 FE) MG TABS Take 1 capsule by mouth daily.      [provider]  fluticasone (FLONASE) 50 MCG/ACT nasal spray Place 1 spray into both nostrils daily. 09/16/17   [provider]  InFLIXimab (REMICADE IV) Inject 1 application into the vein every 30 (thirty) days. Dose varies depending on patients disease.    [provider]  polyethylene glycol-electrolytes (NULYTELY/GOLYTELY) 420 g solution Take 4,000 mLs by mouth once.    [provider]  sucralfate (CARAFATE) 1 g tablet Take 1 g by mouth 4 (four) times daily -  with meals and at bedtime.    [provider]  Tocilizumab (ACTEMRA IV) Inject into the vein. Every 4 weeks.[facility] . Dr .Amil Amen    [provider]  triamterene-hydrochlorothiazide (MAXZIDE-25) 37.5-25 MG per tablet Take 1 tablet by mouth every other day.      [provider]  vitamin B-12  (CYANOCOBALAMIN) 100 MCG tablet Take 50 mcg by mouth daily.      [provider]     Allergies:    No  Known Allergies   Physical Exam:   Vitals  Blood pressure 95/61, pulse 69, temperature 98.1 F (36.7 C), temperature source Oral, resp. rate 14, SpO2 100 %.   1. General well developed female, laying in bed, in no apparent distress.  2. Normal affect and insight, Not Suicidal or Homicidal, Awake Alert, Oriented X 3.  3. No F.N deficits, ALL C.Nerves Intact, Strength 5/5 all 4 extremities, Sensation intact all 4 extremities, Plantars down going.  4. Ears and Eyes appear Normal, Conjunctivae clear, PERRLA. Moist Oral Mucosa.  5. Supple Neck, No JVD, No cervical lymphadenopathy appriciated, No Carotid Bruits.  6. Symmetrical Chest wall movement, Good air movement bilaterally, CTAB.  7. RRR, No Gallops, Rubs or Murmurs, No Parasternal Heave.  8. Positive Bowel Sounds, Abdomen Soft, No tenderness, No organomegaly appriciated,No rebound -guarding or rigidity.  9.  No Cyanosis, Normal Skin Turgor, No Skin Rash or Bruise.  10. Good muscle tone,  joints appear normal , no effusions, Normal ROM.  11. No Palpable Lymph Nodes in Neck or Axillae    Data Review:    CBC Recent Labs  Lab 03/13/20 0919 03/13/20 1129  WBC 4.3 4.6  HGB 4.9* 4.6*  HCT 16.4* 15.7*  PLT 228 217  MCV 76.3* 76.6*  MCH 22.8* 22.4*  MCHC 29.9* 29.3*  RDW 17.2* 17.1*   ------------------------------------------------------------------------------------------------------------------  Chemistries  Recent Labs  Lab 03/13/20 0919 03/13/20 1129  NA 136 136  K 3.3* 3.2*  CL 106 106  CO2 22 20*  GLUCOSE 99 93  BUN 18 18  CREATININE 0.92 0.87  CALCIUM 8.7* 8.8*  AST 18 17  ALT 14 13  ALKPHOS 48 43  BILITOT 0.6 0.5   ------------------------------------------------------------------------------------------------------------------ estimated creatinine clearance is 71.3 mL/min (by C-G  formula based on SCr of 0.87 mg/dL). ------------------------------------------------------------------------------------------------------------------ No results for input(s): TSH, T4TOTAL, T3FREE, THYROIDAB in the last 72 hours.  Invalid input(s): FREET3  Coagulation profile Recent Labs  Lab 03/13/20 1220  INR 1.2   ------------------------------------------------------------------------------------------------------------------- No results for input(s): DDIMER in the last 72 hours. -------------------------------------------------------------------------------------------------------------------  Cardiac Enzymes No results for input(s): CKMB, TROPONINI, MYOGLOBIN in the last 168 hours.  Invalid input(s): CK ------------------------------------------------------------------------------------------------------------------ No results found for: BNP   ---------------------------------------------------------------------------------------------------------------  Urinalysis    Component Value Date/Time   COLORURINE AMBER BIOCHEMICALS MAY BE AFFECTED BY COLOR (A) 09/06/2007 0905   APPEARANCEUR CLOUDY (A) 09/06/2007 0905   LABSPEC 1.026 09/06/2007 0905   PHURINE 5.0 09/06/2007 0905   GLUCOSEU NEGATIVE 09/06/2007 0905   HGBUR SMALL (A) 09/06/2007 0905   BILIRUBINUR NEGATIVE 09/06/2007 0905   KETONESUR NEGATIVE 09/06/2007 0905   PROTEINUR NEGATIVE 09/06/2007 0905   UROBILINOGEN 0.2 09/06/2007 0905   NITRITE NEGATIVE 09/06/2007 0905   LEUKOCYTESUR NEGATIVE 09/06/2007 0905    ----------------------------------------------------------------------------------------------------------------   Imaging Results:    No results found.  My personal review of EKG: Rhythm NSR, Rate  71 /min, QTc 486   Assessment & Plan:    Active Problems:   Hypertension   History of anemia   GI bleed  Acute blood loss anemia due to GI bleed -No baseline hemoglobin, but she was never told she  has anemia and her routinely 8 weeks labs (obtained for Actemra infusion), this is acute blood loss anemia 2 due to GI bleed, most likely upper GI bleed and history of gastric ulcer. -She will be transfused 2 units PRBC, will monitor H&H closely and transfuse for hemoglobin more than 7. -We will start on Protonix drip, keep n.p.o. awaiting  further GI evaluation. -SCDs for DVT prophylaxis. -Patient with history of gastric bypass, chronic iron deficiency anemia versus B12 deficiency anemia could be there, will go ahead and obtain anemia panel increasing both B12 level, iron and ferritin.  Hypertension -Blood pressure on the lower side given her GI bleed, will hold antihypertensive regimen, and will start on IV fluids.  History of RA. -She is on Actemra, this is to be followed as an outpatient by her rheumatologist.  Hypokalemia -Potassium of 3.2, repleted.  History of gastric bypass surgery -Continue with her oral iron/B12/multiple vitamin after medication reconciliation.   DVT Prophylaxis  SCDs   AM Labs Ordered, also please review Full Orders  Family Communication: Admission, patients condition and plan of care including tests being ordered have been discussed with the patient who indicate understanding and agree with the plan and Code Status.  Code Status Full code  Likely DC to  Home  Condition GUARDED    Consults called: GI  Admission status: inpatient  Time spent in minutes : 50 minutes   Phillips Climes M.D on 03/13/2020 at 2:37 PM   [facility] - Office  [phone]

## 2020-03-13 NOTE — ED Triage Notes (Addendum)
Pt has hx of bleeding stomach ulcer reports having abd pain and black stools. Went to ucc and sent here due to low hgb. Only complaint is dizziness today.

## 2020-03-13 NOTE — ED Notes (Signed)
Pt ambulated to the br with a steady gait. 

## 2020-03-13 NOTE — ED Notes (Addendum)
Spoke with Collene Mares, MD regarding pending cbc results, MD verbal order if hgb < 7 order 2 more unit of PBRCs for transfusion.

## 2020-03-13 NOTE — ED Provider Notes (Addendum)
[place]   [id number] 03/13/20 Arrival Time: 0903  ASSESSMENT & PLAN:  1. Severe anemia   2. Gastrointestinal hemorrhage, unspecified gastrointestinal hemorrhage type   3. Hypotension, unspecified hypotension type     Discussed Hg of 4.9 with her from apparent GI bleed and need for blood transfusion. BP is low compared to previous measurements. She is fully alert and oriented. Not tachycardic but is on atenolol. Would consider this an acute GI bleed given sudden onset of symptoms. H/O previous stomach ulceration.  To ED via private vehicle. Stable upon discharge.  Labs Reviewed  CBC - Abnormal; Notable for the following components:      Result Value   RBC 2.15 (*)    Hemoglobin 4.9 (*)    HCT 16.4 (*)    MCV 76.3 (*)    MCH 22.8 (*)    MCHC 29.9 (*)    RDW 17.2 (*)    All other components within normal limits  COMPREHENSIVE METABOLIC PANEL    Reviewed expectations re: course of current medical issues. Questions answered. Outlined signs and symptoms indicating need for more acute intervention. Patient verbalized understanding. After Visit Summary given.   SUBJECTIVE: History from: patient. Mackenzie Rivera is a 59 y.o. female who presents with complaint of dizziness/lightheadedness for the past 3-4 days. Noted "black stools" 2-3 days ago with odor; no Pepto Bismol use; no bright red blood Overall fatigued. No specific abdominal pain, n/v/d. H/O bleeding stomach ulcer not requiring hospitalization; endoscopy confirmed; 2019. Began taking "stomach medicines" a couple of days ago without change in symptoms. Ambulatory but feels very weak.  Social History   Substance and Sexual Activity  Alcohol Use Yes   Comment: 2 glasses wine on weekend   Social History   Tobacco Use  Smoking Status Former Smoker  . Quit date: 09/14/2009  . Years since quitting: 10.5  Smokeless Tobacco Never Used    No LMP recorded. Patient has had an ablation.    Past Surgical History:    Procedure Laterality Date  . COLONOSCOPY  08/29/2012   Procedure: COLONOSCOPY;  Surgeon: Juanita Craver, MD;  Location: [facility];  Service: Endoscopy;  Laterality: N/A;  . COLONOSCOPY N/A 12/24/2017   Procedure: COLONOSCOPY;  Surgeon: Carol Ada, MD;  Location: [facility];  Service: Endoscopy;  Laterality: N/A;  . ENDOMETRIAL ABLATION W/ NOVASURE  11/2007  . ESOPHAGOGASTRODUODENOSCOPY N/A 12/24/2017   Procedure: ESOPHAGOGASTRODUODENOSCOPY (EGD);  Surgeon: Carol Ada, MD;  Location: [facility];  Service: Endoscopy;  Laterality: N/A;  . GASTRIC BYPASS  02/2003  . KNEE ARTHROSCOPY  07/2006   Bilateral  . TOTAL KNEE ARTHROPLASTY  08/2007   Left  . TOTAL KNEE ARTHROPLASTY  07/2007   Right     OBJECTIVE:  Vitals:   03/13/20 0915 03/13/20 0917  BP: (!) 98/42   Pulse: 70   Resp: 16   Temp: (!) 97.3 F (36.3 C)   TempSrc: Oral   SpO2: 100%   Weight:  83 kg  Height:  5\' 2"  (1.575 m)    General appearance: alert, oriented, no acute distress but appears fatigued HEENT: [place]; AT; inner eyelids very pale CV: reg Lungs: unlabored respirations Abdomen: soft; without distention; no specific tenderness to palpation; without masses or organomegaly; without guarding or rebound tenderness Back: without reported CVA tenderness; FROM at waist Extremities: without LE edema; symmetrical; without gross deformities Skin: warm and dry Neurologic: normal gait Psychological: alert and cooperative; normal mood and affect  Labs: Results for orders placed or performed  during the hospital encounter of 03/13/20  CBC  Result Value Ref Range   WBC 4.3 4.0 - 10.5 K/uL   RBC 2.15 (L) 3.87 - 5.11 MIL/uL   Hemoglobin 4.9 (LL) 12.0 - 15.0 g/dL   HCT 16.4 (L) 36 - 46 %   MCV 76.3 (L) 80.0 - 100.0 fL   MCH 22.8 (L) 26.0 - 34.0 pg   MCHC 29.9 (L) 30.0 - 36.0 g/dL   RDW 17.2 (H) 11.5 - 15.5 %   Platelets 228 150 - 400 K/uL   nRBC 0.0 0.0 - 0.2 %   Labs Reviewed  CBC - Abnormal; Notable for the  following components:      Result Value   RBC 2.15 (*)    Hemoglobin 4.9 (*)    HCT 16.4 (*)    MCV 76.3 (*)    MCH 22.8 (*)    MCHC 29.9 (*)    RDW 17.2 (*)    All other components within normal limits  COMPREHENSIVE METABOLIC PANEL     No Known Allergies                                             Past Medical History:  Diagnosis Date  . Abnormal pap   . Amenorrhea    s/p ablation  . Anovulation    h/o chronic   . GERD (gastroesophageal reflux disease)   . H/O dysmenorrhea   . History of anemia   . HPV in female   . HSV-2 infection   . Hx of menorrhagia   . Hypertension   . Mastodynia    h/o  . Morbid obesity ([place])   . Obesity   . Osteoarthritis   . Pelvic pain in female    h/o  . Rheumatoid arthritis(714.0)   . Yeast infection    h/o- on pap smear    Social History   Socioeconomic History  . Marital status: Single    Spouse name: Not on file  . Number of children: 0  . Years of education: Not on file  . Highest education level: Not on file  Occupational History  . Occupation: collections rep    Employer: [employer]  Tobacco Use  . Smoking status: Former Smoker    Quit date: 09/14/2009    Years since quitting: 10.5  . Smokeless tobacco: Never Used  Vaping Use  . Vaping Use: Never used  Substance and Sexual Activity  . Alcohol use: Yes    Comment: 2 glasses wine on weekend  . Drug use: No  . Sexual activity: Not Currently    Birth control/protection: Other-see comments    Comment: ablation  Other Topics Concern  . Not on file  Social History Narrative  . Not on file   Social Determinants of Health   Financial Resource Strain:   . Difficulty of Paying Living Expenses:   Food Insecurity:   . Worried About Charity fundraiser in the Last Year:   . Arboriculturist in the Last Year:   Transportation Needs:   . Film/video editor (Medical):   Marland Kitchen Lack of Transportation (Non-Medical):   Physical Activity:   . Days of Exercise per Week:   .  Minutes of Exercise per Session:   Stress:   . Feeling of Stress :   Social Connections:   . Frequency of Communication  with Friends and Family:   . Frequency of Social Gatherings with Friends and Family:   . Attends Religious Services:   . Active Member of Clubs or Organizations:   . Attends Archivist Meetings:   Marland Kitchen Marital Status:   Intimate Partner Violence:   . Fear of Current or Ex-Partner:   . Emotionally Abused:   Marland Kitchen Physically Abused:   . Sexually Abused:     Family History  Problem Relation Age of Onset  . Kidney disease Father   . Diabetes Maternal Uncle   . Diabetes Paternal Uncle   . Colon cancer Neg Hx      Vanessa Kick, MD 03/13/20 1038    Vanessa Kick, MD 03/13/20 1041

## 2020-03-13 NOTE — Consult Note (Signed)
Reason for Consult: Melena with posthemorrhagic anemia. Referring Physician: [facility] hospitalist. Mackenzie Rivera is an 59 y.o. female.  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  When she became quite dizzy lightheaded and weak, she came to the hospital for further evaluation was found to have a hemoglobin of 4.6 g/dL with melenic stools. 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  She is not on any other anticoagulants or blood thinners at this time she has been using a lot of Tylenol for joint pains from rheumatoid arthritis. She denies having frank hematochezia. Her abdominal pain has improved over the last couple of days. She had a colonoscopy done in 2019 when she was noted to have scattered diverticulosis throughout her colon.  In the emergency room she was found to be hypotensive and was aggressively resuscitated with IV fluids and Protonix drip was started she was also given 2 units of packed red blood cells and is due to have a repeat CBC later on this evening after she received 2 units of blood. She is receiving multiple monthly Actemra injections for her RA. Patient routinely takes PPIs at home for her reflux but denies having any odynophagia or dysphagia.   Past Medical History:  Diagnosis Date  . Abnormal pap   . Amenorrhea    s/p ablation  . Anovulation    h/o chronic   . GERD (gastroesophageal reflux disease)   . H/O dysmenorrhea   . History of anemia   . HPV in female   . HSV-2 infection    . Hx of menorrhagia   . Hypertension   . Mastodynia   . Morbid obesity ([place])   . Osteoarthritis   . Pelvic pain in female    h/o  . Rheumatoid arthritis(714.0)   . Yeast infection    h/o- on pap smear   Past Surgical History:  Procedure Laterality Date  . COLONOSCOPY  08/29/2012   Procedure: COLONOSCOPY;  Surgeon: Juanita Craver, MD;  [facility];  Service: Endoscopy;  Laterality: N/A;  . COLONOSCOPY N/A 12/24/2017   Procedure: COLONOSCOPY;  Surgeon: Carol Ada, MD;  [facility];  Service: Endoscopy;  Laterality: N/A;  . ENDOMETRIAL ABLATION W/ NOVASURE  11/2007  . ESOPHAGOGASTRODUODENOSCOPY N/A 12/24/2017   Procedure: ESOPHAGOGASTRODUODENOSCOPY (EGD);  Surgeon: Carol Ada, MD;  Location: [facility];  Service: Endoscopy;  Laterality: N/A;  . GASTRIC BYPASS  02/2003  . KNEE ARTHROSCOPY  07/2006   Bilateral  . TOTAL KNEE ARTHROPLASTY  08/2007   Left  . TOTAL KNEE ARTHROPLASTY  07/2007   Right   Family History  Problem Relation Age of Onset  . Kidney disease Father   . Diabetes Maternal Uncle   . Diabetes Paternal Uncle   . Colon cancer Neg Hx    Social History:  reports that she quit smoking about 10 years ago. She has never used smokeless tobacco. She reports current alcohol use. She reports that she does not use drugs.  Allergies: No Known  Allergies  Medications: I have reviewed the patient's current medications.  Results for orders placed or performed during the hospital encounter of 03/13/20 (from the past 48 hour(s))  Comprehensive metabolic panel     Status: Abnormal   Collection Time: 03/13/20 11:29 AM  Result Value Ref Range   Sodium 136 135 - 145 mmol/L   Potassium 3.2 (L) 3.5 - 5.1 mmol/L   Chloride 106 98 - 111 mmol/L   CO2 20 (L) 22 - 32 mmol/L   Glucose, Bld 93 70 - 99 mg/dL    Comment: Glucose reference range applies only to samples taken after fasting for at least 8 hours.   BUN 18 6 - 20 mg/dL   Creatinine, Ser 0.87 0.44 -  1.00 mg/dL   Calcium 8.8 (L) 8.9 - 10.3 mg/dL   Total Protein 5.5 (L) 6.5 - 8.1 g/dL   Albumin 3.2 (L) 3.5 - 5.0 g/dL   AST 17 15 - 41 U/L   ALT 13 0 - 44 U/L   Alkaline Phosphatase 43 38 - 126 U/L   Total Bilirubin 0.5 0.3 - 1.2 mg/dL   GFR calc non Af Amer >60 >60 mL/min   GFR calc Af Amer >60 >60 mL/min   Anion gap 10 5 - 15    Comment: Performed at [facility] [address] [place], [place] [address]  CBC     Status: Abnormal   Collection Time: 03/13/20 11:29 AM  Result Value Ref Range   WBC 4.6 4.0 - 10.5 K/uL   RBC 2.05 (L) 3.87 - 5.11 MIL/uL   Hemoglobin 4.6 (LL) 12.0 - 15.0 g/dL    Comment: Reticulocyte Hemoglobin testing may be clinically indicated, consider ordering this additional test [id number] THIS CRITICAL RESULT HAS VERIFIED AND BEEN CALLED TO M HARDY RN BY KIRSTENE FORSYTH ON 06 30 2021 AT 1211, AND HAS BEEN READ BACK.  REPEATED TO VERIFY CORRECTED ON 06/30 AT 1344: PREVIOUSLY REPORTED AS 4.6 Reticulocyte Hemoglobin testing may be clinically indicated, consider ordering this additional test [id number] THIS CRITICAL RESULT HAS VERIFIED AND BEEN CALLED TO M HARDY RN BY KIRSTENE FORSYTH ON 06  30 2021 AT 1211, AND HAS BEEN READ BACK.     HCT 15.7 (L) 36 - 46 %   MCV 76.6 (L) 80.0 - 100.0 fL   MCH 22.4 (L) 26.0 - 34.0 pg   MCHC 29.3 (L) 30.0 - 36.0 g/dL   RDW 17.1 (H) 11.5 - 15.5 %   Platelets 217 150 - 400 K/uL   nRBC 0.0 0.0 - 0.2 %    Comment: Performed at [facility] [address], [place], [place] [address]  Type and screen [place]     Status: None (Preliminary result)   Collection Time: 03/13/20 11:29 AM  Result Value Ref Range   ABO/RH(D) A POS    Antibody Screen NEG    Sample Expiration 03/16/2020,2359    Unit Number [id number]    Blood Component Type RBC LR PHER1    Unit division 00    Status of Unit ALLOCATED    Transfusion Status OK TO TRANSFUSE    Crossmatch Result Compatible    Unit Number [id number]     Blood Component Type RED CELLS,LR    Unit division 00    Status of Unit ISSUED    Transfusion Status OK TO TRANSFUSE    Crossmatch Result      Compatible Performed at [facility], [place]  [address], [place], [place] [address]   ABO/Rh     Status: None   Collection Time: 03/13/20 11:29 AM  Result Value Ref Range   ABO/RH(D)      A POS Performed at [facility] [address] [place], [place] [address]   Protime-INR     Status: None   Collection Time: 03/13/20 12:20 PM  Result Value Ref Range   Prothrombin Time 15.0 11.4 - 15.2 seconds   INR 1.2 0.8 - 1.2    Comment: (NOTE) INR goal varies based on device and disease states. Performed at [facility], [place] [address], [place], [place] [address]   POC occult blood, ED     Status: Abnormal   Collection Time: 03/13/20 12:35 PM  Result Value Ref Range   Fecal Occult Bld POSITIVE (A) NEGATIVE  SARS Coronavirus 2 by RT PCR (hospital order, performed in [facility] hospital lab) Nasopharyngeal Nasopharyngeal Swab     Status: None   Collection Time: 03/13/20 12:53 PM   Specimen: Nasopharyngeal Swab  Result Value Ref Range   SARS Coronavirus 2 NEGATIVE NEGATIVE    Comment: (NOTE) SARS-CoV-2 target nucleic acids are NOT DETECTED.  The SARS-CoV-2 RNA is generally detectable in upper and lower respiratory specimens during the acute phase of infection. The lowest concentration of SARS-CoV-2 viral copies this assay can detect is 250 copies / mL. A negative result does not preclude SARS-CoV-2 infection and should not be used as the sole basis for treatment or other patient management decisions.  A negative result may occur with improper specimen collection / handling, submission of specimen other than nasopharyngeal swab, presence of viral mutation(s) within the areas targeted by this assay, and inadequate number of viral copies (<250 copies / mL). A negative result must be combined with clinical observations,  patient history, and epidemiological information.  Fact Sheet for Patients:   StrictlyIdeas.no  Fact Sheet for Healthcare Providers: BankingDealers.co.za  This test is not yet approved or  cleared by the [place] FDA and has been authorized for detection and/or diagnosis of SARS-CoV-2 by FDA under an Emergency Use Authorization (EUA).  This EUA will remain in effect (meaning this test can be used) for the duration of the COVID-19 declaration under Section 564(b)(1) of the Act, 21 U.S.C. section 360bbb-3(b)(1), unless the authorization is terminated or revoked sooner.  Performed at [facility], [place] [address], [place], [place] [address]   Prepare RBC (crossmatch)     Status: None   Collection Time: 03/13/20 12:56 PM  Result Value Ref Range   Order Confirmation      ORDER PROCESSED BY BLOOD BANK Performed at [facility], [place] [address] [place], [place] [address]   Vitamin B12     Status: None   Collection Time: 03/13/20  2:40 PM  Result Value Ref Range   Vitamin B-12 234 180 - 914 pg/mL    Comment: (NOTE) This assay is not validated for testing neonatal or myeloproliferative syndrome specimens for Vitamin B12 levels. Performed at [facility], [place] [address] [place], [place] [address]   Iron and TIBC     Status: Abnormal   Collection Time: 03/13/20  2:40 PM  Result Value Ref Range   Iron 6 (L) 28 - 170 ug/dL   TIBC 445 250 - 450 ug/dL   Saturation Ratios 1 (L) 10.4 - 31.8 %   UIBC 439 ug/dL    Comment: Performed at [facility], [address], [place], [place] [address]  Ferritin     Status: Abnormal   Collection Time: 03/13/20  2:40 PM  Result Value Ref Range   Ferritin 3 (L) 11 - 307 ng/mL    Comment: Performed at [facility], [place] [address], [place], [place] [address]  Reticulocytes     Status: Abnormal   Collection Time: 03/13/20  2:40 PM  Result Value Ref Range   Retic Ct Pct  2.6 0.4 - 3.1 %   RBC. 1.84 (L) 3.87 - 5.11 MIL/uL   Retic Count, Absolute 48.4 19.0 - 186.0 K/uL   Immature Retic Fract 31.8 (H) 2.3 - 15.9 %    Comment: Performed at [facility] [address] [place], [place] [address]  HIV Antibody (routine testing w rflx)     Status: None   Collection Time: 03/13/20  2:40 PM  Result Value Ref Range   HIV Screen 4th Generation wRfx Non Reactive Non Reactive    Comment: Performed at [facility], [place] [address], [place], [place] [address]  Folate     Status: None   Collection Time: 03/13/20  2:40 PM  Result Value Ref Range   Folate 22.2 >5.9 ng/mL    Comment: Performed at [facility], [place] [address], [place], [place] [address]  ROS:  As stated above in the HPI otherwise negative.  Blood pressure (!) 97/56, pulse 67, temperature 98.1 F (36.7 C), temperature source Oral, resp. rate 17, SpO2 100 %.  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  We will check a CBC tonight and see if she needs additional 2 units of blood before the EGD.  We will allow her ice chips for now. 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 3) Diverticulosis. 4) Hypertension. 5) History of rheumatoid arthritis on Actemra injections. 6) Hypokalemia-being repleted. Juanita Craver 03/13/2020, 4:41 PM

## 2020-03-14 ENCOUNTER — Inpatient Hospital Stay (HOSPITAL_COMMUNITY): Payer: Medicare Other | Admitting: Certified Registered Nurse Anesthetist

## 2020-03-14 ENCOUNTER — Encounter (HOSPITAL_COMMUNITY): Payer: Self-pay | Admitting: Internal Medicine

## 2020-03-14 ENCOUNTER — Encounter (HOSPITAL_COMMUNITY)
Admission: EM | Disposition: A | Discharge status: Home / Self Care | Payer: Self-pay | Source: Home / Self Care | Attending: Internal Medicine

## 2020-03-14 HISTORY — PX: ESOPHAGOGASTRODUODENOSCOPY (EGD) WITH PROPOFOL: SHX5813

## 2020-03-14 HISTORY — PX: BIOPSY: SHX5522

## 2020-03-14 LAB — BASIC METABOLIC PANEL
Anion gap: 9 (ref 5–15)
BUN: 13 mg/dL (ref 6–20)
CO2: 20 mmol/L — ABNORMAL LOW (ref 22–32)
Calcium: 8.7 mg/dL — ABNORMAL LOW (ref 8.9–10.3)
Chloride: 111 mmol/L (ref 98–111)
Creatinine, Ser: 0.94 mg/dL (ref 0.44–1.00)
GFR calc Af Amer: >60 mL/min (ref >60)
GFR calc non Af Amer: >60 mL/min (ref >60)
Glucose, Bld: 77 mg/dL (ref 70–99)
Potassium: 3.4 mmol/L — ABNORMAL LOW (ref 3.5–5.1)
Sodium: 140 mmol/L (ref 135–145)

## 2020-03-14 LAB — CBC
HCT: 24.7 % — ABNORMAL LOW (ref 36.0–46.0)
Hemoglobin: 7.8 g/dL — ABNORMAL LOW (ref 12.0–15.0)
MCH: 25.9 pg — ABNORMAL LOW (ref 26.0–34.0)
MCHC: 31.6 g/dL (ref 30.0–36.0)
MCV: 82.1 fL (ref 80.0–100.0)
Platelets: 163 10*3/uL (ref 150–400)
RBC: 3.01 MIL/uL — ABNORMAL LOW (ref 3.87–5.11)
RDW: 18 % — ABNORMAL HIGH (ref 11.5–15.5)
WBC: 4.9 10*3/uL (ref 4.0–10.5)
nRBC: 0 % (ref 0.0–0.2)

## 2020-03-14 SURGERY — ESOPHAGOGASTRODUODENOSCOPY (EGD) WITH PROPOFOL
Anesthesia: Monitor Anesthesia Care

## 2020-03-14 MED ORDER — PROPOFOL 500 MG/50ML IV EMUL
INTRAVENOUS | Status: DC | PRN
  Administered 2020-03-14: 125 ug/kg/min via INTRAVENOUS

## 2020-03-14 MED ORDER — ALBUMIN HUMAN 5 % IV SOLN: INTRAVENOUS | Status: DC | PRN

## 2020-03-14 MED ORDER — POTASSIUM CHLORIDE CRYS ER 20 MEQ PO TBCR
40.0000 meq | EXTENDED_RELEASE_TABLET | Freq: Once | ORAL | Status: AC
  Administered 2020-03-14: 40 meq via ORAL
  Filled 2020-03-14: qty 2

## 2020-03-14 MED ORDER — SODIUM CHLORIDE 0.9 % IV SOLN: INTRAVENOUS | Status: DC

## 2020-03-14 MED ORDER — PROPOFOL 10 MG/ML IV BOLUS
INTRAVENOUS | Status: DC | PRN
  Administered 2020-03-14: 30 mg via INTRAVENOUS

## 2020-03-14 MED ORDER — LIDOCAINE HCL (CARDIAC) PF 100 MG/5ML IV SOSY
PREFILLED_SYRINGE | INTRAVENOUS | Status: DC | PRN
  Administered 2020-03-14: 80 mg via INTRATRACHEAL

## 2020-03-14 SURGICAL SUPPLY — 15 items

## 2020-03-14 NOTE — Anesthesia Procedure Notes (Signed)
Procedure Name: MAC Date/Time: 03/14/2020 12:28 PM Performed by: Kathryne Hitch, CRNA Pre-anesthesia Checklist: Patient identified, Emergency Drugs available, Suction available and Patient being monitored Patient Re-evaluated:Patient Re-evaluated prior to induction Oxygen Delivery Method: Nasal cannula Preoxygenation: Pre-oxygenation with 100% oxygen Induction Type: IV induction Placement Confirmation: positive ETCO2 Dental Injury: Teeth and Oropharynx as per pre-operative assessment

## 2020-03-14 NOTE — Transfer of Care (Signed)
Immediate Anesthesia Transfer of Care Note  Patient: Mackenzie Rivera  Procedure(s) Performed: ESOPHAGOGASTRODUODENOSCOPY (EGD) WITH PROPOFOL (N/A ) BIOPSY  Patient Location: Endoscopy Unit  Anesthesia Type:MAC  Level of Consciousness: awake, alert , oriented and patient cooperative  Airway & Oxygen Therapy: Patient Spontanous Breathing and Patient connected to nasal cannula oxygen  Post-op Assessment: Report given to RN and Post -op Vital signs reviewed and stable  Post vital signs: Reviewed and stable  Last Vitals:  Vitals Value Taken Time  BP 103/46 03/14/20 1248  Temp 36.8 C 03/14/20 1248  Pulse 73 03/14/20 1248  Resp 14 03/14/20 1248  SpO2 100 % 03/14/20 1248    Last Pain:  Vitals:   03/14/20 1248  TempSrc: Oral  PainSc: 0-No pain      Patients Stated Pain Goal: 0 (35/46/56 8127)  Complications: No complications documented.

## 2020-03-14 NOTE — Op Note (Signed)
[facility] Patient Name: Mackenzie Rivera Procedure Date : 03/14/2020 MRN: [id number] Attending MD: Juanita Craver , MD Date of Birth: September 16, 1960 CSN: [id number] Age: 59 Admit Type: Inpatient Procedure:                EGD with gastric biopsies. Indications:              Melena, Acute post hemorrhagic anemia,                            Gastro-esophageal reflux disease, Patient is s/p                            Rou-en-Y gastric bypass. Providers:                Juanita Craver, MD, Glori Bickers, RN, Cletis Athens,                            Technician, Haze Boyden, CRNA. Referring MD:             Royetta Crochet. Karlton Lemon, MD Medicines:                Monitored Anesthesia Care Complications:            No immediate complications. Estimated Blood Loss:     Estimated blood loss was minimal. Procedure:                Pre-Anesthesia Assessment: - Prior to the                            procedure, a history and physical was performed,                            and patient medications and allergies were                            reviewed. The patient's tolerance of previous                            anesthesia was also reviewed. The risks and                            benefits of the procedure and the sedation options                            and risks were discussed with the patient. All                            questions were answered, and informed consent was                            obtained. Prior Anticoagulants: The patient has                            taken no previous anticoagulant or antiplatelet  agents. ASA Grade Assessment: II - A patient with                            mild systemic disease. After reviewing the risks                            and benefits, the patient was deemed in                            satisfactory condition to undergo the procedure.                            After obtaining informed consent, the endoscope was                             passed under direct vision. Throughout the                            procedure, the patient's blood pressure, pulse, and                            oxygen saturations were monitored continuously. The                            GIF-H190 ([id number] Olympus gastroscope was                            introduced through the mouth, and advanced to the                            jejunum. The EGD was accomplished without                            difficulty. The patient tolerated the procedure                            well. Scope In: Scope Out: Findings:      The examined esophagus and GEJ appeared widely patent and normal; SCJ       was measured at 40 cm.      Evidence of a patent Billroth II gastrojejunostomy was found on noted       with gastrojejunal anastomosis was characterized by ulceration; there       was a 8-9 mm large clean based ulcer, Forrest Class III along with a       smaller ulcer with a pigment spot-Forrest IIc but no visible vessel was       noted; the anstamosis was traversed; the efferent limb and the afferent       limb was examined upto 20 cm from the anastomosis; gastric biopsies were       taken to rule out H. pylori by pathology; the gastric pouch measured       about 10 cm.      The cardia and gastric fundus were normal on retroflexion. Impression:               - Normal appearing,  widely patent esophagus and                            GEJ; SCJ was measured at 40 cm.                           - Patent Billroth II gastrojejunostomy was found,                            characterized by ulceration-gastric biopsies done                            to rule out H. pylori by pathology-.                           - Normal appearing cardia on retroflexion; gastric                            pouch measured about 10 cm.                           - Normal appearing effernt and afferent limb upto                            20 cm from the anastomosis. Moderate  Sedation:      MAC used. Recommendation:           - Full liquid diet today.                           - Continue present medications; do not use any                            NSAIDS.                           - Await pathology results.                           - Return to my office in 1 week. Procedure Code(s):        --- Professional ---                           [phone], Esophagogastroduodenoscopy, flexible,                            transoral; with biopsy, single or multiple Diagnosis Code(s):        --- Professional ---                           K92.1, Melena (includes Hematochezia)                           D62, Acute posthemorrhagic anemia                           K21.9, Gastro-esophageal reflux disease without  esophagitis                           Z98.0, Intestinal bypass and anastomosis status CPT copyright 2019 American Medical Association. All rights reserved. The codes documented in this report are preliminary and upon coder review may  be revised to meet current compliance requirements. Juanita Craver, MD Juanita Craver, MD 03/14/2020 12:52:33 PM This report has been signed electronically. Number of Addenda: 0

## 2020-03-14 NOTE — Progress Notes (Signed)
2 more units PRBc given and completed. CBC moved to 9am -post BT. Will pass on

## 2020-03-14 NOTE — Progress Notes (Signed)
Tolerating Full liquid diet.

## 2020-03-14 NOTE — Progress Notes (Signed)
PROGRESS NOTE    Mackenzie Rivera  WIO:[id number] DOB: May 10, 1961 DOA: 03/13/2020 PCP: Willey Blade, MD    Brief Narrative:  59 y.o. female, with past medical history significant for maternal arthritis, on monthly Actemra injections, hypertension, osteoarthritis, aminuria status post ablation, GERD, history of gastric bypass Roux-en-Y in 2004, patient presents to ED secondary to complaints of lightheadedness and dizziness, as well she does report black stool/melena for 4 days, patient states history of GI bleed, with diagnosis of gastric ulcer by Dr. Collene Mares in April 2019 via endoscopy, she denies any history of anemia or blood transfusions in the past, though she does report abdominal pain over the last 4 days as well, reports Carafate did help with her abdominal pain, ports pain is cramping sensation, 4 out of 10 in severity, currently resolved, reported her dark color stool is improving, it is brown in color over last 24 hours, he denies any NSAID use, aspirin, or any anticoagulation. - in ED hemoglobin was significantly low at 4.7, systolic blood pressure in the mid 90s, was Hemoccult positive, there is no baseline hemoglobin in our records, but patient reports she is getting a blood labs every 2 months for her Actemra injections, and she was never told she has anemia, patient was ordered 2 units PRBC transfusions, she was started on Protonix drip, [facility] hospitalist consulted to admit.  Assessment & Plan:   Active Problems:   Hypertension   History of anemia   GI bleed  Acute blood loss anemia due to upper GI bleed -Presenting hgb in the 4-range. After total 4 units PRBC's with post-transfusion hgb of just under 8 -GI consulted and pt now s/p EGD with findings of ulceration at an anastamosis site -GI recommendation for full liquid diet today, no NSAIDs, PPI -Will repeat CBC in AM  Hypertension -BP meds currently on hold -BP stable at present  History of RA. -She is on Actemra, this  is to be followed as an outpatient by her rheumatologist. -Seems to be stable at this time  Hypokalemia -replaced at time of presentation. -Remains low. Will replace -Repeat bmet in AM  History of gastric bypass surgery -Continue with her oral iron/B12/multiple vitamin after medication reconciliation.   DVT prophylaxis: SCD's Code Status: Full Family Communication: Pt in room, family not at bedside  Status is: Inpatient  Remains inpatient appropriate because:Ongoing diagnostic testing needed not appropriate for outpatient work up   Dispo: The patient is from: Home              Anticipated d/c is to: Home              Anticipated d/c date is: 1 day              Patient currently is not medically stable to d/c.       Consultants:   GI  Procedures:   EGD 7/1  Antimicrobials: Anti-infectives (From admission, onward)   None       Subjective: Without abd pain or nausea. Seen prior to EGD today  Objective: Vitals:   03/14/20 1248 03/14/20 1250 03/14/20 1258 03/14/20 1307  BP: (!) 103/46  (!) 112/58 119/61  Pulse: 73 70    Resp: 14 16 15 13   Temp: 98.2 F (36.8 C)     TempSrc: Oral     SpO2: 100% 100% 95% 96%  Weight:      Height:        Intake/Output Summary (Last 24 hours) at 03/14/2020 1430 Last data filed  at 03/14/2020 1245 Gross per 24 hour  Intake 3269.17 ml  Output 400 ml  Net 2869.17 ml   Filed Weights   03/13/20 2300 03/14/20 0527  Weight: 82 kg 82.8 kg    Examination:  General exam: Appears calm and comfortable  Respiratory system: Clear to auscultation. Respiratory effort normal. Cardiovascular system: S1 & S2 heard, Regular Gastrointestinal system: Abdomen is nondistended, soft and nontender Central nervous system: Alert and oriented. No focal neurological deficits. Extremities: Symmetric 5 x 5 power. Skin: No rashes, lesions Psychiatry: Judgement and insight appear normal. Mood & affect appropriate.   Data Reviewed: I have  personally reviewed following labs and imaging studies  CBC: Recent Labs  Lab 03/13/20 0919 03/13/20 1129 03/13/20 2200 03/14/20 0844  WBC 4.3 4.6 5.2 4.9  HGB 4.9* 4.6* 6.0* 7.8*  HCT 16.4* 15.7* 19.5* 24.7*  MCV 76.3* 76.6* 78.9* 82.1  PLT 228 217 180 387   Basic Metabolic Panel: Recent Labs  Lab 03/13/20 0919 03/13/20 1129 03/14/20 0844  NA 136 136 140  K 3.3* 3.2* 3.4*  CL 106 106 111  CO2 22 20* 20*  GLUCOSE 99 93 77  BUN 18 18 13   CREATININE 0.92 0.87 0.94  CALCIUM 8.7* 8.8* 8.7*   GFR: Estimated Creatinine Clearance: 65.9 mL/min (by C-G formula based on SCr of 0.94 mg/dL). Liver Function Tests: Recent Labs  Lab 03/13/20 0919 03/13/20 1129  AST 18 17  ALT 14 13  ALKPHOS 48 43  BILITOT 0.6 0.5  PROT 5.7* 5.5*  ALBUMIN 3.4* 3.2*   No results for input(s): LIPASE, AMYLASE in the last 168 hours. No results for input(s): AMMONIA in the last 168 hours. Coagulation Profile: Recent Labs  Lab 03/13/20 1220  INR 1.2   Cardiac Enzymes: No results for input(s): CKTOTAL, CKMB, CKMBINDEX, TROPONINI in the last 168 hours. BNP (last 3 results) No results for input(s): PROBNP in the last 8760 hours. HbA1C: No results for input(s): HGBA1C in the last 72 hours. CBG: No results for input(s): GLUCAP in the last 168 hours. Lipid Profile: No results for input(s): CHOL, HDL, LDLCALC, TRIG, CHOLHDL, LDLDIRECT in the last 72 hours. Thyroid Function Tests: No results for input(s): TSH, T4TOTAL, FREET4, T3FREE, THYROIDAB in the last 72 hours. Anemia Panel: Recent Labs    03/13/20 1440  VITAMINB12 234  FOLATE 22.2  FERRITIN 3*  TIBC 445  IRON 6*  RETICCTPCT 2.6   Sepsis Labs: No results for input(s): PROCALCITON, LATICACIDVEN in the last 168 hours.  Recent Results (from the past 240 hour(s))  SARS Coronavirus 2 by RT PCR (hospital order, performed in [facility] hospital lab) Nasopharyngeal Nasopharyngeal Swab     Status: None   Collection Time: 03/13/20 12:53  PM   Specimen: Nasopharyngeal Swab  Result Value Ref Range Status   SARS Coronavirus 2 NEGATIVE NEGATIVE Final    Comment: (NOTE) SARS-CoV-2 target nucleic acids are NOT DETECTED.  The SARS-CoV-2 RNA is generally detectable in upper and lower respiratory specimens during the acute phase of infection. The lowest concentration of SARS-CoV-2 viral copies this assay can detect is 250 copies / mL. A negative result does not preclude SARS-CoV-2 infection and should not be used as the sole basis for treatment or other patient management decisions.  A negative result may occur with improper specimen collection / handling, submission of specimen other than nasopharyngeal swab, presence of viral mutation(s) within the areas targeted by this assay, and inadequate number of viral copies (<250 copies / mL). A negative  result must be combined with clinical observations, patient history, and epidemiological information.  Fact Sheet for Patients:   StrictlyIdeas.no  Fact Sheet for Healthcare Providers: BankingDealers.co.za  This test is not yet approved or  cleared by the [place] FDA and has been authorized for detection and/or diagnosis of SARS-CoV-2 by FDA under an Emergency Use Authorization (EUA).  This EUA will remain in effect (meaning this test can be used) for the duration of the COVID-19 declaration under Section 564(b)(1) of the Act, 21 U.S.C. section 360bbb-3(b)(1), unless the authorization is terminated or revoked sooner.  Performed at [facility], [place] [address] [place], [place] [address]      Radiology Studies: No results found.  Scheduled Meds: . vitamin B-12  50 mcg Oral Daily   Continuous Infusions: . sodium chloride 50 mL/hr at 03/14/20 1223  . pantoprozole (PROTONIX) infusion 8 mg/hr (03/14/20 0925)     LOS: 1 day   Marylu Lund, MD [facility] Pager On Amion  If 7PM-7AM, please contact  night-coverage 03/14/2020, 2:30 PM

## 2020-03-14 NOTE — Anesthesia Preprocedure Evaluation (Signed)
Anesthesia Evaluation  Patient identified by MRN, date of birth, ID band Patient awake    Reviewed: Allergy & Precautions, NPO status , Patient's Chart, lab work & pertinent test results  Airway Mallampati: I  TM Distance: >3 FB Neck ROM: Full    Dental   Pulmonary former smoker,    Pulmonary exam normal        Cardiovascular hypertension, Pt. on medications Normal cardiovascular exam     Neuro/Psych    GI/Hepatic GERD  Medicated and Controlled,  Endo/Other    Renal/GU      Musculoskeletal   Abdominal   Peds  Hematology   Anesthesia Other Findings   Reproductive/Obstetrics                             Anesthesia Physical Anesthesia Plan  ASA: III  Anesthesia Plan: MAC   Post-op Pain Management:    Induction: Intravenous  PONV Risk Score and Plan: 2 and Ondansetron and Midazolam  Airway Management Planned: Nasal Cannula  Additional Equipment:   Intra-op Plan:   Post-operative Plan:   Informed Consent: I have reviewed the patients History and Physical, chart, labs and discussed the procedure including the risks, benefits and alternatives for the proposed anesthesia with the patient or authorized representative who has indicated his/her understanding and acceptance.       Plan Discussed with: CRNA and Surgeon  Anesthesia Plan Comments:         Anesthesia Quick Evaluation

## 2020-03-14 NOTE — Anesthesia Postprocedure Evaluation (Signed)
Anesthesia Post Note  Patient: Mackenzie Rivera  Procedure(s) Performed: ESOPHAGOGASTRODUODENOSCOPY (EGD) WITH PROPOFOL (N/A ) BIOPSY     Patient location during evaluation: PACU Anesthesia Type: MAC Level of consciousness: awake and alert Pain management: pain level controlled Vital Signs Assessment: post-procedure vital signs reviewed and stable Respiratory status: spontaneous breathing, nonlabored ventilation, respiratory function stable and patient connected to nasal cannula oxygen Cardiovascular status: stable and blood pressure returned to baseline Postop Assessment: no apparent nausea or vomiting Anesthetic complications: no   No complications documented.  Last Vitals:  Vitals:   03/14/20 1258 03/14/20 1307  BP: (!) 112/58 119/61  Pulse:    Resp: 15 13  Temp:    SpO2: 95% 96%    Last Pain:  Vitals:   03/14/20 1307  TempSrc:   PainSc: 0-No pain                 Sakoya Win DAVID

## 2020-03-15 ENCOUNTER — Encounter (HOSPITAL_COMMUNITY): Payer: Self-pay | Admitting: Gastroenterology

## 2020-03-15 ENCOUNTER — Other Ambulatory Visit: Payer: Self-pay | Admitting: Physician Assistant

## 2020-03-15 ENCOUNTER — Telehealth: Payer: Self-pay | Admitting: Gastroenterology

## 2020-03-15 DIAGNOSIS — Z862 Personal history of diseases of the blood and blood-forming organs and certain disorders involving the immune mechanism: Secondary | ICD-10-CM

## 2020-03-15 LAB — CBC
HCT: 24.8 % — ABNORMAL LOW (ref 36.0–46.0)
Hemoglobin: 7.9 g/dL — ABNORMAL LOW (ref 12.0–15.0)
MCH: 26.6 pg (ref 26.0–34.0)
MCHC: 31.9 g/dL (ref 30.0–36.0)
MCV: 83.5 fL (ref 80.0–100.0)
Platelets: 187 10*3/uL (ref 150–400)
RBC: 2.97 MIL/uL — ABNORMAL LOW (ref 3.87–5.11)
RDW: 18.2 % — ABNORMAL HIGH (ref 11.5–15.5)
WBC: 4.4 10*3/uL (ref 4.0–10.5)
nRBC: 0 % (ref 0.0–0.2)

## 2020-03-15 LAB — BPAM RBC
Blood Product Expiration Date: 202107072359
Blood Product Expiration Date: 202107072359
Blood Product Expiration Date: 202107292359
Blood Product Expiration Date: 202107292359
ISSUE DATE / TIME: 202106301353
ISSUE DATE / TIME: 202106301736
ISSUE DATE / TIME: 202107010013
ISSUE DATE / TIME: 202107010409
Unit Type and Rh: 600
Unit Type and Rh: 600
Unit Type and Rh: 6200
Unit Type and Rh: 6200

## 2020-03-15 LAB — TYPE AND SCREEN
ABO/RH(D): A POS
Antibody Screen: NEGATIVE
Unit division: 0
Unit division: 0
Unit division: 0
Unit division: 0

## 2020-03-15 LAB — COMPREHENSIVE METABOLIC PANEL
ALT: 14 U/L (ref 0–44)
AST: 21 U/L (ref 15–41)
Albumin: 3.1 g/dL — ABNORMAL LOW (ref 3.5–5.0)
Alkaline Phosphatase: 41 U/L (ref 38–126)
Anion gap: 6 (ref 5–15)
BUN: 8 mg/dL (ref 6–20)
CO2: 21 mmol/L — ABNORMAL LOW (ref 22–32)
Calcium: 8.7 mg/dL — ABNORMAL LOW (ref 8.9–10.3)
Chloride: 112 mmol/L — ABNORMAL HIGH (ref 98–111)
Creatinine, Ser: 0.96 mg/dL (ref 0.44–1.00)
GFR calc Af Amer: >60 mL/min (ref >60)
GFR calc non Af Amer: >60 mL/min (ref >60)
Glucose, Bld: 91 mg/dL (ref 70–99)
Potassium: 3.3 mmol/L — ABNORMAL LOW (ref 3.5–5.1)
Sodium: 139 mmol/L (ref 135–145)
Total Bilirubin: 0.8 mg/dL (ref 0.3–1.2)
Total Protein: 5.1 g/dL — ABNORMAL LOW (ref 6.5–8.1)

## 2020-03-15 LAB — SURGICAL PATHOLOGY

## 2020-03-15 MED ORDER — POTASSIUM CHLORIDE CRYS ER 20 MEQ PO TBCR
40.0000 meq | EXTENDED_RELEASE_TABLET | Freq: Two times a day (BID) | ORAL | Status: AC
  Administered 2020-03-15 (×2): 40 meq via ORAL
  Filled 2020-03-15 (×2): qty 2

## 2020-03-15 NOTE — Discharge Summary (Signed)
Physician Discharge Summary  Mackenzie Rivera TOI:[id number] DOB: June 10, 1961 DOA: 03/13/2020  PCP: Willey Blade, MD  Admit date: 03/13/2020 Discharge date: 03/15/2020  Admitted From: Home Disposition:  Home  Recommendations for Outpatient Follow-up:  1. Follow up with PCP in 1-2 weeks 2. Follow up with GI as scheduled 3. Please monitor blood pressure closely and gradually resume bp meds as tolerates  Discharge Condition:Stable CODE STATUS:Full Diet recommendation: Regular   Brief/Interim Summary: 59 y.o.female,with past medical history significant for maternal arthritis, on monthly Actemra injections, hypertension, osteoarthritis, aminuria status post ablation, GERD, history of gastric bypass Roux-en-Y in 2004, patient presents to ED secondary to complaints of lightheadedness and dizziness, as well she does report black stool/melena for 4 days, patient states history of GI bleed, with diagnosis of gastric ulcer by Dr. Collene Mares in April 2019 via endoscopy, she denies any history of anemia or blood transfusions in the past, though she does report abdominal pain over the last 4 days as well, reports Carafate did help with her abdominal pain, ports pain is cramping sensation, 4 out of 10 in severity, currently resolved, reported her dark color stool is improving, it is brown in color over last 24 hours, he denies any NSAID use, aspirin, or any anticoagulation. - in [place] was significantly low at 4.7, systolic blood pressure in the mid 90s, was Hemoccult positive, there is no baseline hemoglobin in our records, but patient reports she is getting a blood labs every 2 months for her Actemra injections, and she was never told she has anemia, patient was ordered 2 units PRBC transfusions, she was started on Protonix drip, [facility] hospitalist consulted to admit.  Discharge Diagnoses:  Active Problems:   Hypertension   History of anemia   GI bleed  Acute blood loss anemia due to upper GI  bleed -Presenting hgb in the 4-range. After total 4 units PRBC's with post-transfusion hgb of just under 8, remaining stable -GI consulted and pt now s/p EGD with findings of ulceration at an anastamosis site -GI recommendation for full liquid diet today, no NSAIDs, continued PPI -would have pt follow up with GI as scheduled   Hypertension -BP meds currently on hold -BP stable at present while off bp meds -Would monitor bp closely and gradually resume as tolerated  History of RA. -She is on Actemra, this is to be followed as an outpatient by her rheumatologist. -Seems to be stable at this time  Hypokalemia -replaced   History of gastric bypass surgery -Continue with her oral iron/B12/multiple vitamin   Discharge Instructions   Allergies as of 03/15/2020   No Known Allergies     Medication List    STOP taking these medications   atenolol 50 MG tablet Commonly known as: TENORMIN   triamterene-hydrochlorothiazide 37.5-25 MG tablet Commonly known as: MAXZIDE-25     TAKE these medications   ACTEMRA IV Inject into the vein every 30 (thirty) days. [facility] rheumatology . Dr .Amil Amen   calcium citrate 950 (200 Ca) MG tablet Commonly known as: CALCITRATE - dosed in mg elemental calcium Take 1 tablet by mouth daily.   Dexilant 60 MG capsule Generic drug: dexlansoprazole Take 60 capsules by mouth daily.   Ergocalciferol 50 MCG (2000 UT) Tabs Take 2,000 Units by mouth daily.   fluticasone 50 MCG/ACT nasal spray Commonly known as: FLONASE Place 1 spray into both nostrils daily as needed.   Iron 325 (65 Fe) MG Tabs Take 325 mg by mouth daily.   predniSONE 5 MG tablet Commonly known  as: DELTASONE Take 5 mg by mouth daily as needed (osteoporosis).   sucralfate 1 g tablet Commonly known as: CARAFATE Take 1 g by mouth 4 (four) times daily -  with meals and at bedtime.   vitamin B-12 100 MCG tablet Commonly known as: CYANOCOBALAMIN Take 50 mcg by mouth daily.        Follow-up Information    Willey Blade, MD. Schedule an appointment as soon as possible for a visit in 2 week(s).   Specialty: Internal Medicine Contact information: [address] [place] [address] [phone]        Juanita Craver, MD Follow up.   Specialty: Gastroenterology Why: as scheduled Contact information: [address] [place] [address] [phone]              No Known Allergies  Consultations:  GI  Procedures/Studies:  No results found.  Subjective: Eager to go home  Discharge Exam: Vitals:   03/15/20 0816 03/15/20 1205  BP: 112/74 103/67  Pulse: 60 64  Resp: 20   Temp: 97.6 F (36.4 C) 97.7 F (36.5 C)  SpO2: 100% 100%   Vitals:   03/14/20 2019 03/15/20 0426 03/15/20 0816 03/15/20 1205  BP: 98/63 105/63 112/74 103/67  Pulse: 64 63 60 64  Resp: 18 18 20    Temp: 98.5 F (36.9 C) 98.3 F (36.8 C) 97.6 F (36.4 C) 97.7 F (36.5 C)  TempSrc: Oral Oral Oral Oral  SpO2: 100% 100% 100% 100%  Weight:  83.4 kg    Height:        General: Pt is alert, awake, not in acute distress Cardiovascular: RRR, S1/S2 +, no rubs, no gallops Respiratory: CTA bilaterally, no wheezing, no rhonchi Abdominal: Soft, NT, ND, bowel sounds + Extremities: no edema, no cyanosis   The results of significant diagnostics from this hospitalization (including imaging, microbiology, ancillary and laboratory) are listed below for reference.     Microbiology: Recent Results (from the past 240 hour(s))  SARS Coronavirus 2 by RT PCR (hospital order, performed in [facility] hospital lab) Nasopharyngeal Nasopharyngeal Swab     Status: None   Collection Time: 03/13/20 12:53 PM   Specimen: Nasopharyngeal Swab  Result Value Ref Range Status   SARS Coronavirus 2 NEGATIVE NEGATIVE Final    Comment: (NOTE) SARS-CoV-2 target nucleic acids are NOT DETECTED.  The SARS-CoV-2 RNA is generally detectable in upper and  lower respiratory specimens during the acute phase of infection. The lowest concentration of SARS-CoV-2 viral copies this assay can detect is 250 copies / mL. A negative result does not preclude SARS-CoV-2 infection and should not be used as the sole basis for treatment or other patient management decisions.  A negative result may occur with improper specimen collection / handling, submission of specimen other than nasopharyngeal swab, presence of viral mutation(s) within the areas targeted by this assay, and inadequate number of viral copies (<250 copies / mL). A negative result must be combined with clinical observations, patient history, and epidemiological information.  Fact Sheet for Patients:   StrictlyIdeas.no  Fact Sheet for Healthcare Providers: BankingDealers.co.za  This test is not yet approved or  cleared by the [place] FDA and has been authorized for detection and/or diagnosis of SARS-CoV-2 by FDA under an Emergency Use Authorization (EUA).  This EUA will remain in effect (meaning this test can be used) for the duration of the COVID-19 declaration under Section 564(b)(1) of the Act, 21 U.S.C. section 360bbb-3(b)(1), unless the authorization  is terminated or revoked sooner.  Performed at [facility], [place] [address], [place], [place] [address]      Labs: BNP (last 3 results) No results for input(s): BNP in the last 8760 hours. Basic Metabolic Panel: Recent Labs  Lab 03/13/20 0919 03/13/20 1129 03/14/20 0844 03/15/20 0505  NA 136 136 140 139  K 3.3* 3.2* 3.4* 3.3*  CL 106 106 111 112*  CO2 22 20* 20* 21*  GLUCOSE 99 93 77 91  BUN 18 18 13 8   CREATININE 0.92 0.87 0.94 0.96  CALCIUM 8.7* 8.8* 8.7* 8.7*   Liver Function Tests: Recent Labs  Lab 03/13/20 0919 03/13/20 1129 03/15/20 0505  AST 18 17 21   ALT 14 13 14   ALKPHOS 48 43 41  BILITOT 0.6 0.5 0.8  PROT 5.7* 5.5* 5.1*  ALBUMIN 3.4* 3.2*  3.1*   No results for input(s): LIPASE, AMYLASE in the last 168 hours. No results for input(s): AMMONIA in the last 168 hours. CBC: Recent Labs  Lab 03/13/20 0919 03/13/20 1129 03/13/20 2200 03/14/20 0844 03/15/20 0505  WBC 4.3 4.6 5.2 4.9 4.4  HGB 4.9* 4.6* 6.0* 7.8* 7.9*  HCT 16.4* 15.7* 19.5* 24.7* 24.8*  MCV 76.3* 76.6* 78.9* 82.1 83.5  PLT 228 217 180 163 187   Cardiac Enzymes: No results for input(s): CKTOTAL, CKMB, CKMBINDEX, TROPONINI in the last 168 hours. BNP: Invalid input(s): POCBNP CBG: No results for input(s): GLUCAP in the last 168 hours. D-Dimer No results for input(s): DDIMER in the last 72 hours. Hgb A1c No results for input(s): HGBA1C in the last 72 hours. Lipid Profile No results for input(s): CHOL, HDL, LDLCALC, TRIG, CHOLHDL, LDLDIRECT in the last 72 hours. Thyroid function studies No results for input(s): TSH, T4TOTAL, T3FREE, THYROIDAB in the last 72 hours.  Invalid input(s): FREET3 Anemia work up Recent Labs    03/13/20 1440  VITAMINB12 234  FOLATE 22.2  FERRITIN 3*  TIBC 445  IRON 6*  RETICCTPCT 2.6   Urinalysis    Component Value Date/Time   COLORURINE AMBER BIOCHEMICALS MAY BE AFFECTED BY COLOR (A) 09/06/2007 0905   APPEARANCEUR CLOUDY (A) 09/06/2007 0905   LABSPEC 1.026 09/06/2007 0905   PHURINE 5.0 09/06/2007 0905   GLUCOSEU NEGATIVE 09/06/2007 0905   HGBUR SMALL (A) 09/06/2007 0905   BILIRUBINUR NEGATIVE 09/06/2007 0905   KETONESUR NEGATIVE 09/06/2007 0905   PROTEINUR NEGATIVE 09/06/2007 0905   UROBILINOGEN 0.2 09/06/2007 0905   NITRITE NEGATIVE 09/06/2007 0905   LEUKOCYTESUR NEGATIVE 09/06/2007 0905   Sepsis Labs Invalid input(s): PROCALCITONIN,  WBC,  LACTICIDVEN Microbiology Recent Results (from the past 240 hour(s))  SARS Coronavirus 2 by RT PCR (hospital order, performed in [facility] hospital lab) Nasopharyngeal Nasopharyngeal Swab     Status: None   Collection Time: 03/13/20 12:53 PM   Specimen: Nasopharyngeal  Swab  Result Value Ref Range Status   SARS Coronavirus 2 NEGATIVE NEGATIVE Final    Comment: (NOTE) SARS-CoV-2 target nucleic acids are NOT DETECTED.  The SARS-CoV-2 RNA is generally detectable in upper and lower respiratory specimens during the acute phase of infection. The lowest concentration of SARS-CoV-2 viral copies this assay can detect is 250 copies / mL. A negative result does not preclude SARS-CoV-2 infection and should not be used as the sole basis for treatment or other patient management decisions.  A negative result may occur with improper specimen collection / handling, submission of specimen other than nasopharyngeal swab, presence of viral mutation(s) within the areas targeted by  this assay, and inadequate number of viral copies (<250 copies / mL). A negative result must be combined with clinical observations, patient history, and epidemiological information.  Fact Sheet for Patients:   StrictlyIdeas.no  Fact Sheet for Healthcare Providers: BankingDealers.co.za  This test is not yet approved or  cleared by the [place] FDA and has been authorized for detection and/or diagnosis of SARS-CoV-2 by FDA under an Emergency Use Authorization (EUA).  This EUA will remain in effect (meaning this test can be used) for the duration of the COVID-19 declaration under Section 564(b)(1) of the Act, 21 U.S.C. section 360bbb-3(b)(1), unless the authorization is terminated or revoked sooner.  Performed at [facility], [place] [address], [place], [place] [address]    Time spent: 30 min  SIGNED:   Marylu Lund, MD  [facility] 03/15/2020, 3:06 PM  If 7PM-7AM, please contact night-coverage

## 2020-03-15 NOTE — Telephone Encounter (Signed)
Primary Gastroenterology: Dr. Juanita Craver  After hours call from the patient re: bright red blood  Discharged from the hospital 2 hours ago after her hospitalization for symptomatic GI blood loss anemia presenting with melena. EGD performed by Dr. Collene Mares yesterday revealed gastrojejunal anastomotic ulcers. One was an 8-63mm large clean based ulcer and a smaller adjacent ulcer had a pigmented spot but no visible vessel.  She received 4 units of PRBCs. Hemoglobin of 4.6 on admission improved to 7.9 this morning. Discharged on Dexilant, Carafate, oral iron, and prednsione.   Had been feeling well since discharge until she had a single episode of painless bright red blood on the stool and on the toilet paper when she got home. No nausea, vomiting, abdominal pain, constipation, rectal pain or straining. No new fatigue, weakness, headache, irritability, exercise intolerance, exertional dyspnea, vertigo, or angina pectoris.    She denies a prior history of BRBPR. However, colonoscopy with Dr. Benson Norway for hemetochezia in 2019 revealed diverticulosis as the source of bleeding. No hemorrhoids noted or seen on images included in the procedure report. Internal hemorrhoids noted on screening colonoscopy 2013.  I asked her to return to the ED with any additional bleeding or any associated symptoms.

## 2020-03-17 ENCOUNTER — Other Ambulatory Visit: Payer: Self-pay

## 2020-03-17 ENCOUNTER — Encounter (HOSPITAL_COMMUNITY): Payer: Self-pay

## 2020-03-17 ENCOUNTER — Inpatient Hospital Stay (HOSPITAL_COMMUNITY)
Admission: EM | Admit: 2020-03-17 | Discharge: 2020-03-22 | DRG: 378 | Disposition: A | Discharge status: Home / Self Care | Payer: Medicare Other | Attending: Internal Medicine | Admitting: Internal Medicine

## 2020-03-17 DIAGNOSIS — K922 Gastrointestinal hemorrhage, unspecified: Secondary | ICD-10-CM | POA: Diagnosis not present

## 2020-03-17 DIAGNOSIS — E669 Obesity, unspecified: Secondary | ICD-10-CM | POA: Diagnosis present

## 2020-03-17 DIAGNOSIS — R001 Bradycardia, unspecified: Secondary | ICD-10-CM

## 2020-03-17 DIAGNOSIS — D500 Iron deficiency anemia secondary to blood loss (chronic): Secondary | ICD-10-CM

## 2020-03-17 DIAGNOSIS — E876 Hypokalemia: Secondary | ICD-10-CM | POA: Diagnosis present

## 2020-03-17 DIAGNOSIS — Z9884 Bariatric surgery status: Secondary | ICD-10-CM

## 2020-03-17 DIAGNOSIS — I10 Essential (primary) hypertension: Secondary | ICD-10-CM | POA: Diagnosis present

## 2020-03-17 DIAGNOSIS — Z7289 Other problems related to lifestyle: Secondary | ICD-10-CM

## 2020-03-17 DIAGNOSIS — K284 Chronic or unspecified gastrojejunal ulcer with hemorrhage: Principal | ICD-10-CM | POA: Diagnosis present

## 2020-03-17 DIAGNOSIS — R17 Unspecified jaundice: Secondary | ICD-10-CM | POA: Diagnosis present

## 2020-03-17 DIAGNOSIS — Z96653 Presence of artificial knee joint, bilateral: Secondary | ICD-10-CM | POA: Diagnosis present

## 2020-03-17 DIAGNOSIS — Z862 Personal history of diseases of the blood and blood-forming organs and certain disorders involving the immune mechanism: Secondary | ICD-10-CM

## 2020-03-17 DIAGNOSIS — Z79899 Other long term (current) drug therapy: Secondary | ICD-10-CM

## 2020-03-17 DIAGNOSIS — K219 Gastro-esophageal reflux disease without esophagitis: Secondary | ICD-10-CM | POA: Diagnosis present

## 2020-03-17 DIAGNOSIS — Z6833 Body mass index (BMI) 33.0-33.9, adult: Secondary | ICD-10-CM

## 2020-03-17 DIAGNOSIS — T50995A Adverse effect of other drugs, medicaments and biological substances, initial encounter: Secondary | ICD-10-CM | POA: Diagnosis present

## 2020-03-17 DIAGNOSIS — Z20822 Contact with and (suspected) exposure to covid-19: Secondary | ICD-10-CM | POA: Diagnosis present

## 2020-03-17 DIAGNOSIS — Z87891 Personal history of nicotine dependence: Secondary | ICD-10-CM

## 2020-03-17 DIAGNOSIS — D62 Acute posthemorrhagic anemia: Secondary | ICD-10-CM | POA: Diagnosis present

## 2020-03-17 DIAGNOSIS — M069 Rheumatoid arthritis, unspecified: Secondary | ICD-10-CM | POA: Diagnosis present

## 2020-03-17 LAB — COMPREHENSIVE METABOLIC PANEL
ALT: 13 U/L (ref 0–44)
AST: 18 U/L (ref 15–41)
Albumin: 3.3 g/dL — ABNORMAL LOW (ref 3.5–5.0)
Alkaline Phosphatase: 43 U/L (ref 38–126)
Anion gap: 6 (ref 5–15)
BUN: 10 mg/dL (ref 6–20)
CO2: 20 mmol/L — ABNORMAL LOW (ref 22–32)
Calcium: 8.5 mg/dL — ABNORMAL LOW (ref 8.9–10.3)
Chloride: 113 mmol/L — ABNORMAL HIGH (ref 98–111)
Creatinine, Ser: 0.88 mg/dL (ref 0.44–1.00)
GFR calc Af Amer: >60 mL/min (ref >60)
GFR calc non Af Amer: >60 mL/min (ref >60)
Glucose, Bld: 90 mg/dL (ref 70–99)
Potassium: 3.6 mmol/L (ref 3.5–5.1)
Sodium: 139 mmol/L (ref 135–145)
Total Bilirubin: 0.7 mg/dL (ref 0.3–1.2)
Total Protein: 5.3 g/dL — ABNORMAL LOW (ref 6.5–8.1)

## 2020-03-17 LAB — CBC
HCT: 20.8 % — ABNORMAL LOW (ref 36.0–46.0)
Hemoglobin: 6.4 g/dL — CL (ref 12.0–15.0)
MCH: 25.9 pg — ABNORMAL LOW (ref 26.0–34.0)
MCHC: 30.8 g/dL (ref 30.0–36.0)
MCV: 84.2 fL (ref 80.0–100.0)
Platelets: 206 10*3/uL (ref 150–400)
RBC: 2.47 MIL/uL — ABNORMAL LOW (ref 3.87–5.11)
RDW: 19.2 % — ABNORMAL HIGH (ref 11.5–15.5)
WBC: 4.8 10*3/uL (ref 4.0–10.5)
nRBC: 0 % (ref 0.0–0.2)

## 2020-03-17 LAB — PREPARE RBC (CROSSMATCH)

## 2020-03-17 LAB — POC OCCULT BLOOD, ED: Fecal Occult Bld: POSITIVE — AB

## 2020-03-17 LAB — I-STAT BETA HCG BLOOD, ED (MC, WL, AP ONLY): I-stat hCG, quantitative: <5 m[IU]/mL (ref <5)

## 2020-03-17 LAB — SARS CORONAVIRUS 2 BY RT PCR (HOSPITAL ORDER, PERFORMED IN ~~LOC~~ HOSPITAL LAB): SARS Coronavirus 2: NEGATIVE

## 2020-03-17 LAB — HEMOGLOBIN AND HEMATOCRIT, BLOOD
HCT: 20 % — ABNORMAL LOW (ref 36.0–46.0)
Hemoglobin: 6.1 g/dL — CL (ref 12.0–15.0)

## 2020-03-17 MED ORDER — ACETAMINOPHEN 650 MG RE SUPP: 650.0000 mg | Freq: Four times a day (QID) | RECTAL | Status: DC | PRN

## 2020-03-17 MED ORDER — DIPHENHYDRAMINE HCL 50 MG/ML IJ SOLN
25.0000 mg | Freq: Four times a day (QID) | INTRAMUSCULAR | Status: DC | PRN
  Administered 2020-03-17: 25 mg via INTRAVENOUS
  Filled 2020-03-17: qty 1

## 2020-03-17 MED ORDER — SODIUM CHLORIDE 0.9 % IV SOLN
10.0000 mL/h | Freq: Once | INTRAVENOUS | Status: AC
  Administered 2020-03-19: 10 mL/h via INTRAVENOUS

## 2020-03-17 MED ORDER — ONDANSETRON HCL 4 MG PO TABS: 4.0000 mg | ORAL_TABLET | Freq: Four times a day (QID) | ORAL | Status: DC | PRN

## 2020-03-17 MED ORDER — SODIUM CHLORIDE 0.9 % IV SOLN
8.0000 mg/h | INTRAVENOUS | Status: DC
  Administered 2020-03-17 – 2020-03-18 (×2): 8 mg/h via INTRAVENOUS
  Filled 2020-03-17 (×3): qty 80

## 2020-03-17 MED ORDER — DEXTROSE IN LACTATED RINGERS 5 % IV SOLN: INTRAVENOUS | Status: AC

## 2020-03-17 MED ORDER — ACETAMINOPHEN 325 MG PO TABS
650.0000 mg | ORAL_TABLET | Freq: Four times a day (QID) | ORAL | Status: DC | PRN
  Administered 2020-03-19 – 2020-03-20 (×3): 650 mg via ORAL
  Filled 2020-03-17 (×3): qty 2

## 2020-03-17 MED ORDER — ONDANSETRON HCL 4 MG/2ML IJ SOLN: 4.0000 mg | Freq: Four times a day (QID) | INTRAMUSCULAR | Status: DC | PRN

## 2020-03-17 MED ORDER — SODIUM CHLORIDE 0.9 % IV BOLUS
1000.0000 mL | Freq: Once | INTRAVENOUS | Status: AC
  Administered 2020-03-17: 1000 mL via INTRAVENOUS

## 2020-03-17 MED ORDER — SODIUM CHLORIDE 0.9 % IV SOLN
80.0000 mg | Freq: Once | INTRAVENOUS | Status: AC
  Administered 2020-03-17: 80 mg via INTRAVENOUS
  Filled 2020-03-17: qty 80

## 2020-03-17 NOTE — ED Provider Notes (Signed)
[facility] EMERGENCY DEPARTMENT Provider Note   CSN: [id number] Arrival date & time: 03/17/20  1541     History Chief Complaint  Patient presents with  . Dizziness  . Melena    Mackenzie Rivera is a 59 y.o. female hx of hypertension, here presenting with rectal bleeding.  Patient was recently admitted for GI bleed.  Patient received 4 units PRBC.  Patient also had endoscopy that showed bleeding from the anastomosis site from the Roux-en-Y.  Patient states that since discharge 2 days ago, she had one episode of bloody diarrhea yesterday.  She then had 2 more episodes of bright red blood per rectum today.  She called her GI doctor was sent in for evaluation.  Patient is taking her Carafate as well as PPIs.  The history is provided by the patient.       Past Medical History:  Diagnosis Date  . Abnormal pap   . Amenorrhea    s/p ablation  . Anovulation    h/o chronic   . GERD (gastroesophageal reflux disease)   . H/O dysmenorrhea   . History of anemia   . HPV in female   . HSV-2 infection   . Hx of menorrhagia   . Hypertension   . Mastodynia    h/o  . Morbid obesity ([place])   . Obesity   . Osteoarthritis   . Pelvic pain in female    h/o  . Rheumatoid arthritis(714.0)   . Yeast infection    h/o- on pap smear    Patient Active Problem List   Diagnosis Date Noted  . GI bleed 03/13/2020  . 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 Abnormal pap   . Hypertension   . Obesity   . Osteoarthritis   . Rheumatoid arthritis(714.0)   . Hx of menorrhagia   . HPV in female   . H/O dysmenorrhea   . Pelvic pain in female   . Morbid obesity ([place])   . History of anemia   . Mastodynia   . Anovulation   . HSV-2 infection   . Hx of candidal vulvovaginitis   . GERD (gastroesophageal reflux disease)   . Yeast infection   . Amenorrhea     Past Surgical History:  Procedure Laterality Date  . BIOPSY  03/14/2020   Procedure: BIOPSY;  Surgeon:  Juanita Craver, MD;  Location: [facility];  Service: Gastroenterology;;  . COLONOSCOPY  08/29/2012   Procedure: COLONOSCOPY;  Surgeon: Juanita Craver, MD;  Location: [facility] ENDOSCOPY;  Service: Endoscopy;  Laterality: N/A;  . COLONOSCOPY N/A 12/24/2017   Procedure: COLONOSCOPY;  Surgeon: Carol Ada, MD;  Location: [facility] ENDOSCOPY;  Service: Endoscopy;  Laterality: N/A;  . ENDOMETRIAL ABLATION W/ NOVASURE  11/2007  . ESOPHAGOGASTRODUODENOSCOPY N/A 12/24/2017   Procedure: ESOPHAGOGASTRODUODENOSCOPY (EGD);  Surgeon: Carol Ada, MD;  Location: [facility];  Service: Endoscopy;  Laterality: N/A;  . ESOPHAGOGASTRODUODENOSCOPY (EGD) WITH PROPOFOL N/A 03/14/2020   Procedure: ESOPHAGOGASTRODUODENOSCOPY (EGD) WITH PROPOFOL;  Surgeon: Juanita Craver, MD;  Location: [facility] ENDOSCOPY;  Service: Gastroenterology;  Laterality: N/A;  . GASTRIC BYPASS  02/2003  . KNEE ARTHROSCOPY  07/2006   Bilateral  . TOTAL KNEE ARTHROPLASTY  08/2007   Left  . TOTAL KNEE ARTHROPLASTY  07/2007   Right     OB History    Gravida  0   Para  0   Term  0   Preterm  0   AB  0   Living  0  SAB  0   TAB  0   Ectopic  0   Multiple  0   Live Births              Family History  Problem Relation Age of Onset  . Kidney disease Father   . Diabetes Maternal Uncle   . Diabetes Paternal Uncle   . Colon cancer Neg Hx     Social History   Tobacco Use  . Smoking status: Former Smoker    Quit date: 09/14/2009    Years since quitting: 10.5  . Smokeless tobacco: Never Used  Vaping Use  . Vaping Use: Never used  Substance Use Topics  . Alcohol use: Yes    Comment: 2 glasses wine on weekend  . Drug use: No    Home Medications Prior to Admission medications   Medication Sig Start Date End Date Taking? Authorizing Provider  calcium citrate (CALCITRATE - DOSED IN MG ELEMENTAL CALCIUM) 950 MG tablet Take 1 tablet by mouth daily.      [provider]  DEXILANT 60 MG capsule Take 60 capsules by mouth daily.  03/09/20   [provider]  Ergocalciferol 2000 units TABS Take 2,000 Units by mouth daily.     [provider]  Ferrous Sulfate (IRON) 325 (65 FE) MG TABS Take 325 mg by mouth daily.     [provider]  fluticasone (FLONASE) 50 MCG/ACT nasal spray Place 1 spray into both nostrils daily as needed.  09/16/17   [provider]  predniSONE (DELTASONE) 5 MG tablet Take 5 mg by mouth daily as needed (osteoporosis).  03/03/20   [provider]  sucralfate (CARAFATE) 1 g tablet Take 1 g by mouth 4 (four) times daily -  with meals and at bedtime.    [provider]  Tocilizumab (ACTEMRA IV) Inject into the vein every 30 (thirty) days. [facility] rheumatology . Dr .Amil Amen    [provider]  vitamin B-12 (CYANOCOBALAMIN) 100 MCG tablet Take 50 mcg by mouth daily.      [provider]    Allergies    Patient has no known allergies.  Review of Systems   Review of Systems  Gastrointestinal: Positive for blood in stool.  Neurological: Positive for dizziness.  All other systems reviewed and are negative.   Physical Exam Updated Vital Signs BP (!) 115/48 (BP Location: Right Arm)   Pulse 71   Temp 97.6 F (36.4 C) (Oral)   Resp 18   Ht 5\' 2"  (1.575 m)   Wt 83 kg   SpO2 100%   BMI 33.47 kg/m   Physical Exam Vitals and nursing note reviewed.  HENT:     Head: Normocephalic.     Nose: Nose normal.     Mouth/Throat:     Mouth: Mucous membranes are moist.  Eyes:     Extraocular Movements: Extraocular movements intact.     Pupils: Pupils are equal, round, and reactive to light.  Cardiovascular:     Rate and Rhythm: Normal rate and regular rhythm.     Pulses: Normal pulses.     Heart sounds: Normal heart sounds.  Pulmonary:     Effort: Pulmonary effort is normal.     Breath sounds: Normal breath sounds.  Abdominal:     General: Abdomen is flat.     Palpations: Abdomen is soft.  Genitourinary:    Comments: Rectal- +  bright red blood  Musculoskeletal:     Cervical back: Normal range  of motion.  Skin:    General: Skin is warm.  Neurological:     General: No focal deficit present.     Mental Status: She is alert and oriented to person, place, and time.  Psychiatric:        Mood and Affect: Mood normal.        Behavior: Behavior normal.     ED Results / Procedures / Treatments   Labs (all labs ordered are listed, but only abnormal results are displayed) Labs Reviewed  COMPREHENSIVE METABOLIC PANEL - Abnormal; Notable for the following components:      Result Value   Chloride 113 (*)    CO2 20 (*)    Calcium 8.5 (*)    Total Protein 5.3 (*)    Albumin 3.3 (*)    All other components within normal limits  CBC - Abnormal; Notable for the following components:   RBC 2.47 (*)    Hemoglobin 6.4 (*)    HCT 20.8 (*)    MCH 25.9 (*)    RDW 19.2 (*)    All other components within normal limits  POC OCCULT BLOOD, ED - Abnormal; Notable for the following components:   Fecal Occult Bld POSITIVE (*)    All other components within normal limits  SARS CORONAVIRUS 2 BY RT PCR (HOSPITAL ORDER, [facility] LAB)  I-STAT BETA HCG BLOOD, ED ([facility], [facility], [facility] ONLY)  TYPE AND SCREEN  PREPARE RBC (CROSSMATCH)    EKG EKG Interpretation  Date/Time:  Sunday March 17 2020 16:00:18 EDT Ventricular Rate:  72 PR Interval:  160 QRS Duration: 86 QT Interval:  386 QTC Calculation: 422 R Axis:   -13 Text Interpretation: Normal sinus rhythm Low voltage QRS Nonspecific T wave abnormality Abnormal ECG No significant change since last tracing Confirmed by Wandra Arthurs [phone]) on 03/17/2020 4:17:10 PM   Radiology No results found.  Procedures Procedures (including critical care time)  CRITICAL CARE Performed by: Wandra Arthurs   Total critical care time: 30 minutes  Critical care time was exclusive of separately billable procedures and treating other patients.  Critical care was necessary to  treat or prevent imminent or life-threatening deterioration.  Critical care was time spent personally by me on the following activities: development of treatment plan with patient and/or surrogate as well as nursing, discussions with consultants, evaluation of patient's response to treatment, examination of patient, obtaining history from patient or surrogate, ordering and performing treatments and interventions, ordering and review of laboratory studies, ordering and review of radiographic studies, pulse oximetry and re-evaluation of patient's condition.   Medications Ordered in ED Medications  pantoprazole (PROTONIX) 80 mg in sodium chloride 0.9 % 100 mL IVPB (has no administration in time range)  sodium chloride 0.9 % bolus 1,000 mL (has no administration in time range)  pantoprazole (PROTONIX) 80 mg in sodium chloride 0.9 % 100 mL (0.8 mg/mL) infusion (has no administration in time range)  0.9 %  sodium chloride infusion (has no administration in time range)    ED Course  I have reviewed the triage vital signs and the nursing notes.  Pertinent labs & imaging results that were available during my care of the patient were reviewed by me and considered in my medical decision making (see chart for details).    MDM Rules/Calculators/A&P                          Mackenzie Rivera is  a 59 y.o. female here with rectal bleeding. This is a recurrent problem.  She was just recently admitted for GI bleed.  Plan to repeat CBC, CMP, guaiac.  Will give IV fluids and IV Protonix.  5:01 PM Hemoglobin down to 6.4 from 8.  Guaiac is positive.  Started on IV Protonix drip.  Also transfuse 1 unit PRBC.  I talked to Dr. Modena Nunnery from [facility]. She states that the patient called yesterday. Recommend NPO and she will see patient in the morning. Hospitalist to admit for anemia from GI bleed.   Final Clinical Impression(s) / ED Diagnoses Final diagnoses:  None    Rx / DC Orders ED Discharge Orders    None        Drenda Freeze, MD 03/17/20 1701

## 2020-03-17 NOTE — Progress Notes (Signed)
Paged on call hospitalist Dr. Kennon Holter to inform that patient finished one unit PRBC at 2137, pt called me back to room at 2150 stating she was experiencing itching, a few hives and mild tingling of her lips. Vital signs are stable. She does not complain or pain or difficulty breathing. Charge RN Tracie Harrier also in room to assess patient. No acute distress noted. Awaitng response from MD. [phone] 03/17/2020 Cyndi Bender, RN

## 2020-03-17 NOTE — H&P (Signed)
History and Physical    Mackenzie Rivera XTG:[id number] DOB: 1961/08/21 DOA: 03/17/2020  PCP: Willey Blade, MD  Patient coming from: home  I have personally briefly reviewed patient's old medical records in [place]  Chief Complaint: melena  HPI: Mackenzie Rivera is Mackenzie Rivera 59 y.o. female with medical history significant of RA (on actemra), gastric bypass, GERD, hypertension, and GI bleed with recent admission requiring 4 units pRBC for anemia and EGD which showed an ulcer at the gastrojejunal anastamosis.  She was discharged on Jhaden Pizzuto PPI and sucralfate.  She's represented today with continued GI bleeding.  She notes BRBPR that she noted after getting home from being discharged.  Today she noticed melena x2 so she came to the hospital.  She notes mild lightheadedness.  She denies fevers, chills, chest pain, shortness of breath, nausea, vomiting, abdominal pain, edema.  She denies NSAIDs.  Denies smoking, drinking.    ED Course: labs, GI consult, PPI gtt, 1 unit pRBC.  Admit to hospitalist for recurrent GI bleed.  Review of Systems: As per HPI otherwise all other systems reviewed and are negative.  Past Medical History:  Diagnosis Date  . Abnormal pap   . Amenorrhea    s/p ablation  . Anovulation    h/o chronic   . GERD (gastroesophageal reflux disease)   . H/O dysmenorrhea   . History of anemia   . HPV in female   . HSV-2 infection   . Hx of menorrhagia   . Hypertension   . Mastodynia    h/o  . Morbid obesity ([place])   . Obesity   . Osteoarthritis   . Pelvic pain in female    h/o  . Rheumatoid arthritis(714.0)   . Yeast infection    h/o- on pap smear    Past Surgical History:  Procedure Laterality Date  . BIOPSY  03/14/2020   Procedure: BIOPSY;  Surgeon: Juanita Craver, MD;  Location: [place];  Service: Gastroenterology;;  . COLONOSCOPY  08/29/2012   Procedure: COLONOSCOPY;  Surgeon: Juanita Craver, MD;  [facility]: [facility];  Service: Endoscopy;  Laterality: N/Cielle Aguila;  . COLONOSCOPY  N/Aarin Sparkman 12/24/2017   Procedure: COLONOSCOPY;  Surgeon: Carol Ada, MD;  [facility];  Service: Endoscopy;  Laterality: N/[place];  . ENDOMETRIAL ABLATION W/ NOVASURE  11/2007  . ESOPHAGOGASTRODUODENOSCOPY N/Nomar Broad 12/24/2017   Procedure: ESOPHAGOGASTRODUODENOSCOPY (EGD);  Surgeon: Carol Ada, MD;  [facility];  Service: Endoscopy;  Laterality: N/Seanmichael Salmons;  . ESOPHAGOGASTRODUODENOSCOPY (EGD) WITH PROPOFOL N/Dejan Angert 03/14/2020   Procedure: ESOPHAGOGASTRODUODENOSCOPY (EGD) WITH PROPOFOL;  Surgeon: Juanita Craver, MD;  Location[facility];  Service: Gastroenterology;  Laterality: N/Kimiye Strathman;  . GASTRIC BYPASS  02/2003  . KNEE ARTHROSCOPY  07/2006   Bilateral  . TOTAL KNEE ARTHROPLASTY  08/2007   Left  . TOTAL KNEE ARTHROPLASTY  07/2007   Right    Social History  reports that she quit smoking about 10 years ago. She has never used smokeless tobacco. She reports current alcohol use. She reports that she does not use drugs.  No Known Allergies  Family History  Problem Relation Age of Onset  . Kidney disease Father   . Diabetes Maternal Uncle   . Diabetes Paternal Uncle   . Colon cancer Neg Hx    Prior to Admission medications   Medication Sig Start Date End Date Taking? Authorizing Provider  calcium citrate (CALCITRATE - DOSED IN MG ELEMENTAL CALCIUM) 950 MG tablet Take 1 tablet by mouth daily.     Yes [provider]  DEXILANT 60 MG capsule Take 60 capsules by mouth daily. 03/09/20  Yes [provider]  Ergocalciferol 2000 units TABS Take 2,000 Units by mouth daily.    Yes [provider]  Ferrous Sulfate (IRON) 325 (65 FE) MG TABS Take 325 mg by mouth daily.    Yes [provider]  sucralfate (CARAFATE) 1 g tablet Take 1 g by mouth 4 (four) times daily -  with meals and at bedtime.   Yes [provider]  vitamin B-12 (CYANOCOBALAMIN) 100 MCG tablet Take 50 mcg by mouth daily.     Yes [provider]  Tocilizumab (ACTEMRA IV) Inject into the vein  every 30 (thirty) days. [facility] rheumatology . Dr .Mackenzie Rivera    [provider]    Physical Exam: Vitals:   03/17/20 1545 03/17/20 1546  BP: (!) 115/48   Pulse: 71   Resp: 18   Temp: 97.6 F (36.4 C)   TempSrc: Oral   SpO2: 100%   Weight:  83 kg  Height:  5\' 2"  (1.575 m)    Constitutional: NAD, calm, comfortable Vitals:   03/17/20 1545 03/17/20 1546  BP: (!) 115/48   Pulse: 71   Resp: 18   Temp: 97.6 F (36.4 C)   TempSrc: Oral   SpO2: 100%   Weight:  83 kg  Height:  5\' 2"  (1.575 m)   Eyes: PERRL, lids and conjunctivae normal ENMT: Mucous membranes are moist.  Neck: normal, supple, no masses, no thyromegaly Respiratory: clear to auscultation bilaterally, no wheezing, no crackles.  Cardiovascular: Regular rate and rhythm, no murmurs / rubs / gallops. No extremity edema.   Abdomen: no tenderness, no masses palpated. No hepatosplenomegaly. Bowel sounds positive.  Musculoskeletal: no clubbing / cyanosis. No joint deformity upper and lower extremities. Good ROM, no contractures. Normal muscle tone.  Skin: no rashes, lesions, ulcers. No induration Neurologic: CN 2-12 grossly intact. Sensation intact. Moving all extremities. Psychiatric: Normal judgment and insight. Alert and oriented x 3. Normal mood.   Labs on Admission: I have personally reviewed following labs and imaging studies  CBC: Recent Labs  Lab 03/13/20 1129 03/13/20 2200 03/14/20 0844 03/15/20 0505 03/17/20 1608  WBC 4.6 5.2 4.9 4.4 4.8  HGB 4.6* 6.0* 7.8* 7.9* 6.4*  HCT 15.7* 19.5* 24.7* 24.8* 20.8*  MCV 76.6* 78.9* 82.1 83.5 84.2  PLT 217 180 163 187 854    Basic Metabolic Panel: Recent Labs  Lab 03/13/20 0919 03/13/20 1129 03/14/20 0844 03/15/20 0505 03/17/20 1608  NA 136 136 140 139 139  K 3.3* 3.2* 3.4* 3.3* 3.6  CL 106 106 111 112* 113*  CO2 22 20* 20* 21* 20*  GLUCOSE 99 93 77 91 90  BUN 18 18 13 8 10   CREATININE 0.92 0.87 0.94 0.96 0.88  CALCIUM 8.7* 8.8* 8.7* 8.7* 8.5*      GFR: Estimated Creatinine Clearance: 70.5 mL/min (by C-G formula based on SCr of 0.88 mg/dL).  Liver Function Tests: Recent Labs  Lab 03/13/20 0919 03/13/20 1129 03/15/20 0505 03/17/20 1608  AST 18 17 21 18   ALT 14 13 14 13   ALKPHOS 48 43 41 43  BILITOT 0.6 0.5 0.8 0.7  PROT 5.7* 5.5* 5.1* 5.3*  ALBUMIN 3.4* 3.2* 3.1* 3.3*    Urine analysis:    Component Value Date/Time   COLORURINE AMBER BIOCHEMICALS MAY BE AFFECTED BY COLOR (Marquan Vokes) 09/06/2007 0905   APPEARANCEUR CLOUDY (Yianni Skilling) 09/06/2007 0905   LABSPEC 1.026 09/06/2007 0905   PHURINE 5.0 09/06/2007 0905  GLUCOSEU NEGATIVE 09/06/2007 0905   HGBUR SMALL (Deniro Laymon) 09/06/2007 0905   BILIRUBINUR NEGATIVE 09/06/2007 0905   KETONESUR NEGATIVE 09/06/2007 0905   PROTEINUR NEGATIVE 09/06/2007 0905   UROBILINOGEN 0.2 09/06/2007 0905   NITRITE NEGATIVE 09/06/2007 0905   LEUKOCYTESUR NEGATIVE 09/06/2007 0905    Radiological Exams on Admission: No results found.  EKG: Independently reviewed. Sinus rhythm, appears similar to priors  Assessment/Plan Active Problems:   GI bleed   Acute Blood Loss Anemia  Recurrent GI Bleed  Melena:  Recently discharged 7/2, had endoscopy on that admission with ulceration at gastrojejunal anastomosis BRBPR noted shortly after discharge with 2 episodes of melena noted 7/4 (today) Protonix bolus and gtt  Case discussed with Dr. Modena Nunnery from [place] who recommends NPO and they'll see her in the morning 1 unit pRBC ordered per EDP Hb 6.4 today, 7.9 on 7/2 day of discharge (s/p 4 units pRBC during last hospitalization) Follow iron, ferritin, b12, folate  History of RA: actemra outpatient  History Gastric Bypass: continue supplements once taking PO  Hx of HTN: not on any meds, BP appropriate, follow   DVT prophylaxis: SCD  Code Status:   full  Family Communication:  None, declined me calling    Patient is from:  home  Anticipated DC to:  home  Anticipated DC date:  7/5 or 7/6  Anticipated DC  barriers: Pending workup by GI, stabilization of hemoglobin   Consults called:  GI  Admission status:  observation   Severity of Illness: The appropriate patient status for this patient is OBSERVATION. Observation status is judged to be reasonable and necessary in order to provide the required intensity of service to ensure the patient's safety. The patient's presenting symptoms, physical exam findings, and initial radiographic and laboratory data in the context of their medical condition is felt to place them at decreased risk for further clinical deterioration. Furthermore, it is anticipated that the patient will be medically stable for discharge from the hospital within 2 midnights of admission. The following factors support the patient status of observation.   " The patient's presenting symptoms include BRBPR, melena, lightheadedness. " The initial radiographic and laboratory data are Hb 6.4.    Fayrene Helper MD [facility]  How to contact the [facility] Attending or Consulting provider [place] or covering provider during after hours [place], for this patient?   1. Check the care team in [facility] and look for Syliva Mee) attending/consulting [facility] provider listed and b) the [facility] team listed 2. Log into [email or web address] and use [place]'s universal password to access. If you do not have the password, please contact the hospital operator. 3. Locate the [facility] Gastroenterology provider you are looking for under [facility] and page to Karter Haire number that you can be directly reached. 4. If you still have difficulty reaching the provider, please page the [facility] (Director on Call) for the Hospitalists listed on amion for assistance.  03/17/2020, 6:28 PM

## 2020-03-17 NOTE — Progress Notes (Signed)
2215 Gave ordered diphenhydramine. Assessed effectiveness at 2245. Pt states no itching, no distress. VItal signs remain stable with no complaints of pain or needs from patient. 03/17/2020 [place], Rn

## 2020-03-17 NOTE — ED Triage Notes (Signed)
Pt arrives POV for eval of bloody stools and dizziness after recently being dcd for GI bleed. Pt rec'd 4 units PRBC when she was hospitalized last week, d/c'd w/ HGB of 7.9. Reports now feels the same as last week

## 2020-03-17 NOTE — Plan of Care (Signed)
  Problem: Education: Goal: Knowledge of General Education information will improve Description Including pain rating scale, medication(s)/side effects and non-pharmacologic comfort measures Outcome: Progressing   

## 2020-03-18 DIAGNOSIS — Z96653 Presence of artificial knee joint, bilateral: Secondary | ICD-10-CM | POA: Diagnosis present

## 2020-03-18 DIAGNOSIS — Z6833 Body mass index (BMI) 33.0-33.9, adult: Secondary | ICD-10-CM | POA: Diagnosis not present

## 2020-03-18 DIAGNOSIS — D500 Iron deficiency anemia secondary to blood loss (chronic): Secondary | ICD-10-CM | POA: Diagnosis not present

## 2020-03-18 DIAGNOSIS — K2100 Gastro-esophageal reflux disease with esophagitis, without bleeding: Secondary | ICD-10-CM | POA: Diagnosis not present

## 2020-03-18 DIAGNOSIS — K922 Gastrointestinal hemorrhage, unspecified: Secondary | ICD-10-CM

## 2020-03-18 DIAGNOSIS — R001 Bradycardia, unspecified: Secondary | ICD-10-CM | POA: Diagnosis not present

## 2020-03-18 DIAGNOSIS — D62 Acute posthemorrhagic anemia: Secondary | ICD-10-CM | POA: Diagnosis present

## 2020-03-18 DIAGNOSIS — E669 Obesity, unspecified: Secondary | ICD-10-CM | POA: Diagnosis present

## 2020-03-18 DIAGNOSIS — Z79899 Other long term (current) drug therapy: Secondary | ICD-10-CM | POA: Diagnosis not present

## 2020-03-18 DIAGNOSIS — K219 Gastro-esophageal reflux disease without esophagitis: Secondary | ICD-10-CM | POA: Diagnosis present

## 2020-03-18 DIAGNOSIS — Z20822 Contact with and (suspected) exposure to covid-19: Secondary | ICD-10-CM | POA: Diagnosis present

## 2020-03-18 DIAGNOSIS — T50995A Adverse effect of other drugs, medicaments and biological substances, initial encounter: Secondary | ICD-10-CM | POA: Diagnosis present

## 2020-03-18 DIAGNOSIS — R17 Unspecified jaundice: Secondary | ICD-10-CM | POA: Diagnosis present

## 2020-03-18 DIAGNOSIS — M069 Rheumatoid arthritis, unspecified: Secondary | ICD-10-CM | POA: Diagnosis present

## 2020-03-18 DIAGNOSIS — Z7289 Other problems related to lifestyle: Secondary | ICD-10-CM | POA: Diagnosis not present

## 2020-03-18 DIAGNOSIS — K284 Chronic or unspecified gastrojejunal ulcer with hemorrhage: Secondary | ICD-10-CM | POA: Diagnosis present

## 2020-03-18 DIAGNOSIS — Z87891 Personal history of nicotine dependence: Secondary | ICD-10-CM | POA: Diagnosis not present

## 2020-03-18 DIAGNOSIS — Z9884 Bariatric surgery status: Secondary | ICD-10-CM | POA: Diagnosis not present

## 2020-03-18 DIAGNOSIS — E876 Hypokalemia: Secondary | ICD-10-CM | POA: Diagnosis present

## 2020-03-18 DIAGNOSIS — K254 Chronic or unspecified gastric ulcer with hemorrhage: Secondary | ICD-10-CM | POA: Diagnosis not present

## 2020-03-18 DIAGNOSIS — I10 Essential (primary) hypertension: Secondary | ICD-10-CM | POA: Diagnosis present

## 2020-03-18 LAB — COMPREHENSIVE METABOLIC PANEL
ALT: 14 U/L (ref 0–44)
AST: 20 U/L (ref 15–41)
Albumin: 2.7 g/dL — ABNORMAL LOW (ref 3.5–5.0)
Alkaline Phosphatase: 36 U/L — ABNORMAL LOW (ref 38–126)
Anion gap: 7 (ref 5–15)
BUN: 9 mg/dL (ref 6–20)
CO2: 21 mmol/L — ABNORMAL LOW (ref 22–32)
Calcium: 8.5 mg/dL — ABNORMAL LOW (ref 8.9–10.3)
Chloride: 114 mmol/L — ABNORMAL HIGH (ref 98–111)
Creatinine, Ser: 0.91 mg/dL (ref 0.44–1.00)
GFR calc Af Amer: >60 mL/min (ref >60)
GFR calc non Af Amer: >60 mL/min (ref >60)
Glucose, Bld: 92 mg/dL (ref 70–99)
Potassium: 3.5 mmol/L (ref 3.5–5.1)
Sodium: 142 mmol/L (ref 135–145)
Total Bilirubin: 1.8 mg/dL — ABNORMAL HIGH (ref 0.3–1.2)
Total Protein: 4.6 g/dL — ABNORMAL LOW (ref 6.5–8.1)

## 2020-03-18 LAB — IRON AND TIBC
Iron: 9 ug/dL — ABNORMAL LOW (ref 28–170)
Saturation Ratios: 2 % — ABNORMAL LOW (ref 10.4–31.8)
TIBC: 400 ug/dL (ref 250–450)
UIBC: 391 ug/dL

## 2020-03-18 LAB — VITAMIN B12: Vitamin B-12: 260 pg/mL (ref 180–914)

## 2020-03-18 LAB — HEMOGLOBIN AND HEMATOCRIT, BLOOD
HCT: 26.5 % — ABNORMAL LOW (ref 36.0–46.0)
HCT: 27.4 % — ABNORMAL LOW (ref 36.0–46.0)
HCT: 27.9 % — ABNORMAL LOW (ref 36.0–46.0)
Hemoglobin: 8.6 g/dL — ABNORMAL LOW (ref 12.0–15.0)
Hemoglobin: 8.6 g/dL — ABNORMAL LOW (ref 12.0–15.0)
Hemoglobin: 8.9 g/dL — ABNORMAL LOW (ref 12.0–15.0)

## 2020-03-18 LAB — CBC
HCT: 25.3 % — ABNORMAL LOW (ref 36.0–46.0)
Hemoglobin: 8 g/dL — ABNORMAL LOW (ref 12.0–15.0)
MCH: 26.2 pg (ref 26.0–34.0)
MCHC: 31.6 g/dL (ref 30.0–36.0)
MCV: 83 fL (ref 80.0–100.0)
Platelets: 162 10*3/uL (ref 150–400)
RBC: 3.05 MIL/uL — ABNORMAL LOW (ref 3.87–5.11)
RDW: 18.8 % — ABNORMAL HIGH (ref 11.5–15.5)
WBC: 3.5 10*3/uL — ABNORMAL LOW (ref 4.0–10.5)
nRBC: 0 % (ref 0.0–0.2)

## 2020-03-18 LAB — FERRITIN: Ferritin: 4 ng/mL — ABNORMAL LOW (ref 11–307)

## 2020-03-18 LAB — PREPARE RBC (CROSSMATCH)

## 2020-03-18 LAB — FOLATE: Folate: 18.5 ng/mL (ref >5.9)

## 2020-03-18 MED ORDER — PANTOPRAZOLE SODIUM 40 MG IV SOLR
40.0000 mg | Freq: Two times a day (BID) | INTRAVENOUS | Status: DC
  Administered 2020-03-18 – 2020-03-22 (×8): 40 mg via INTRAVENOUS
  Filled 2020-03-18 (×8): qty 40

## 2020-03-18 MED ORDER — SODIUM CHLORIDE 0.9% IV SOLUTION: Freq: Once | INTRAVENOUS | Status: AC

## 2020-03-18 NOTE — Plan of Care (Signed)
  Problem: Education: Goal: Knowledge of General Education information will improve Description Including pain rating scale, medication(s)/side effects and non-pharmacologic comfort measures Outcome: Progressing   

## 2020-03-18 NOTE — Progress Notes (Addendum)
Paged On call hospitalist Dr. Kennon Holter to inform that lab called critical Hgb of 6.1 at 2357. Awaiting response. 03/18/2020 0005 Cyndi Bender, RN Received orders to transfuse 2 units PRBC's. 03/18/2020 0010 Cyndi Bender, RN

## 2020-03-18 NOTE — Consult Note (Addendum)
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 Benson Norway 2019  Recurrent GI bleeding with associated anemia presenting with blood in the stool,  worrisome for anastomotic ulcer rebleeding. She is hemodynamically stable. EGD recommended for evaluation and intervention.  Limited anesthesia today on holiday schedule and they are only available for emergencies. Will plan relook EGD with Dr. Collene Mares tomorrow.   PLAN: - Clear liquid diet today - NPO at midnight - IV PPI BID - Serial hgb/hct with transfusion as indicated - EGD for further evaluation - Consider IV iron prior to discharge  Please see the "Patient Instructions" section for addition details about the plan.  HPI: Mackenzie Rivera is a 59 y.o. female seen in consultation at the request of Dr. Darl Householder. Her gastroenterologist is Dr. Collene Mares. She had a Roux-en-Y gastroejejunostomy in [place] over 15 years ago. Reports adherence to supplement recommendations including daily B12 and iron. Was diagnosed with an anastomotic ulcer 04/2019. She also has rheumatoid arthritis treated with Actemra and prednisone.   Hospitalizated for symptomatic GI blood loss anemia presenting with melena 6/30-03/15/20. EGD performed by Dr. Collene Mares 03/14/20  revealed gastrojejunal anastomotic ulcers. One was an 8-9mm large clean based ulcer and a smaller adjacent ulcer had a pigmented spot but no visible vessel. She received 4 units of PRBCs.  Hemoglobin of 4.6 on admission improved to 7.9 prior to discharge. Discharged on Dexilant, Carafate, and oral iron.  Had been feeling well since discharge until she had a single episode of painless bright red blood on the stool and on the toilet paper two hours after she got home 03/15/20. Had additional dark red stools 03/17/20 and returned to the ED for further evaluation.  Hemoglobin was 6.4 yesterday. Iron 9, ferritin 4.  Declined to 6.1 last night and is 8.0 this morning after a one unit PRBC transfusion.  BUN is normal now, as it was last week.   Symptoms were similar to those that she experienced prior to her hospitalization last week.  No nausea, vomiting, abdominal pain, constipation, rectal pain or straining. No new fatigue, weakness, headache, irritability, exercise intolerance, exertional dyspnea, vertigo, or angina pectoris.    She stopped using all NSAIDs over a year ago when she was first diagnosed with ulcer disease. Uses Tylenol for joint pains from rheumatoid arthritis.   She denies a prior history of BRBPR. However, colonoscopy with Dr. Benson Norway for hemetochezia in 2019 revealed diverticulosis as the source of bleeding. No hemorrhoids noted or seen on images included in the procedure report. Internal hemorrhoids noted on screening colonoscopy 2013.    Past Medical History:  Diagnosis Date  . Abnormal pap   . Amenorrhea    s/p ablation  . Anovulation    h/o chronic   . GERD (gastroesophageal reflux disease)   . H/O dysmenorrhea   . History of anemia   . HPV in female   . HSV-2 infection   . Hx of menorrhagia   . Hypertension   . Mastodynia    h/o  .  Morbid obesity ([place])   . Obesity   . Osteoarthritis   . Pelvic pain in female    h/o  . Rheumatoid arthritis(714.0)   . Yeast infection    h/o- on pap smear    Past Surgical History:  Procedure Laterality Date  . BIOPSY  03/14/2020   Procedure: BIOPSY;  Surgeon: Juanita Craver, MD;  Location: [place];  Service:  Gastroenterology;;  . COLONOSCOPY  08/29/2012   Procedure: COLONOSCOPY;  Surgeon: Juanita Craver, MD;  [facility];  Service: Endoscopy;  Laterality: N/A;  . COLONOSCOPY N/A 12/24/2017   Procedure: COLONOSCOPY;  Surgeon: Carol Ada, MD;  [facility];  Service: Endoscopy;  Laterality: N/A;  . ENDOMETRIAL ABLATION W/ NOVASURE  11/2007  . ESOPHAGOGASTRODUODENOSCOPY N/A 12/24/2017   Procedure: ESOPHAGOGASTRODUODENOSCOPY (EGD);  Surgeon: Carol Ada, MD;  [facility];  Service: Endoscopy;  Laterality: N/A;  . ESOPHAGOGASTRODUODENOSCOPY (EGD) WITH PROPOFOL N/A 03/14/2020   Procedure: ESOPHAGOGASTRODUODENOSCOPY (EGD) WITH PROPOFOL;  Surgeon: Juanita Craver, MD;  Location[facility];  Service: Gastroenterology;  Laterality: N/A;  . GASTRIC BYPASS  02/2003  . KNEE ARTHROSCOPY  07/2006   Bilateral  . TOTAL KNEE ARTHROPLASTY  08/2007   Left  . TOTAL KNEE ARTHROPLASTY  07/2007   Right    Current Facility-Administered Medications  Medication Dose Route Frequency Provider Last Rate Last Admin  . 0.9 %  sodium chloride infusion  10 mL/hr Intravenous Once Elodia Florence., MD      . acetaminophen (TYLENOL) tablet 650 mg  650 mg Oral Q6H PRN Elodia Florence., MD       Or  . acetaminophen (TYLENOL) suppository 650 mg  650 mg Rectal Q6H PRN Elodia Florence., MD      . dextrose 5 % in lactated ringers infusion   Intravenous Continuous Elodia Florence., MD 75 mL/hr at 03/17/20 2208 New Bag at 03/17/20 2208  . diphenhydrAMINE (BENADRYL) injection 25 mg  25 mg Intravenous Q6H PRN Lovey Newcomer T, NP   25 mg at 03/17/20 2215  . ondansetron (ZOFRAN) tablet 4 mg  4 mg Oral Q6H PRN Elodia Florence., MD       Or  . ondansetron [facility]) injection 4 mg  4 mg Intravenous Q6H PRN Elodia Florence., MD      . pantoprazole (PROTONIX) 80 mg in sodium chloride 0.9 % 100 mL (0.8 mg/mL) infusion  8 mg/hr Intravenous Continuous Elodia Florence., MD 10  mL/hr at 03/18/20 0625 8 mg/hr at 03/18/20 0625    Allergies as of 03/17/2020  . (No Known Allergies)    Family History  Problem Relation Age of Onset  . Kidney disease Father   . Diabetes Maternal Uncle   . Diabetes Paternal Uncle   . Colon cancer Neg Hx     Social History   Socioeconomic History  . Marital status: Single    Spouse name: Not on file  . Number of children: 0  . Years of education: Not on file  . Highest education level: Not on file  Occupational History  . Occupation: collections rep    Employer: [employer]  Tobacco Use  . Smoking status: Former Smoker    Quit date: 09/14/2009    Years since quitting: 10.5  . Smokeless tobacco: Never Used  Vaping Use  . Vaping Use: Never used  Substance and Sexual Activity  . Alcohol use: Yes    Comment: 2 glasses wine on weekend  .  Drug use: No  . Sexual activity: Not Currently    Birth control/protection: Other-see comments    Comment: ablation  Other Topics Concern  . Not on file  Social History Narrative  . Not on file   Social Determinants of Health   Financial Resource Strain:   . Difficulty of Paying Living Expenses:   Food Insecurity:   . Worried About Charity fundraiser in the Last Year:   . Arboriculturist in the Last Year:   Transportation Needs:   . Film/video editor (Medical):   Marland Kitchen Lack of Transportation (Non-Medical):   Physical Activity:   . Days of Exercise per Week:   . Minutes of Exercise per Session:   Stress:   . Feeling of Stress :   Social Connections:   . Frequency of Communication with Friends and Family:   . Frequency of Social Gatherings with Friends and Family:   . Attends Religious Services:   . Active Member of Clubs or Organizations:   . Attends Archivist Meetings:   Marland Kitchen Marital Status:   Intimate Partner Violence:   . Fear of Current or Ex-Partner:   . Emotionally Abused:   Marland Kitchen Physically Abused:   . Sexually Abused:    Physical Exam: General:   Alert,   well-nourished, pleasant and cooperative in NAD Head:  Normocephalic and atraumatic. Eyes:  Sclera clear, no icterus.   Conjunctiva pink. Ears:  Normal auditory acuity. Nose:  No deformity, discharge,  or lesions. Mouth:  No deformity or lesions.  No blood in the oropharynx.  Neck:  Supple; no masses or thyromegaly. Lungs:  Clear throughout to auscultation.   No wheezes. Heart:  Regular rate and rhythm; no murmurs. Abdomen:  Soft, nontender, nondistended, normal bowel sounds, no rebound or guarding. No hepatosplenomegaly. Well-healed surgical scars.    Rectal:  Deferred  Msk:  Symmetrical. No boney deformities LAD: No inguinal or umbilical LAD Extremities:  No clubbing or edema. Neurologic:  Alert and  oriented x4;  grossly nonfocal Skin:  Intact without significant lesions or rashes. Psych:  Alert and cooperative. Normal mood and affect.   Lab Results: Recent Labs    03/17/20 1608 03/17/20 2318 03/18/20 0637  WBC 4.8  --  3.5*  HGB 6.4* 6.1* 8.0*  HCT 20.8* 20.0* 25.3*  PLT 206  --  162   BMET Recent Labs    03/17/20 1608 03/18/20 0637  NA 139 142  K 3.6 3.5  CL 113* 114*  CO2 20* 21*  GLUCOSE 90 92  BUN 10 9  CREATININE 0.88 0.91  CALCIUM 8.5* 8.5*   LFT Recent Labs    03/18/20 0637  PROT 4.6*  ALBUMIN 2.7*  AST 20  ALT 14  ALKPHOS 36*  BILITOT 1.8*     Sereniti Wan L. Tarri Glenn, MD, MPH 03/18/2020, 10:09 AM

## 2020-03-18 NOTE — Progress Notes (Signed)
Per tele CCMD, pt had 2.05 sinus pause. Fayrene Helper, MD aware. Will continue to monitor.

## 2020-03-18 NOTE — Plan of Care (Signed)

## 2020-03-18 NOTE — Progress Notes (Addendum)
PROGRESS NOTE    Mackenzie Rivera  WVP:[id number] DOB: 1961/08/25 DOA: 03/17/2020 PCP: Willey Blade, MD   Chief Complaint  Patient presents with  . Dizziness  . Melena    Brief Narrative:  Mackenzie Rivera is [name] 59 y.o. female with medical history significant of RA (on actemra), gastric bypass, GERD, hypertension, and GI bleed with recent admission requiring 4 units pRBC for anemia and EGD which showed an ulcer at the gastrojejunal anastamosis.  She was discharged on [name] PPI and sucralfate.  She's represented today with continued GI bleeding.  She notes BRBPR that she noted after getting home from being discharged.  Today she noticed melena x2 so she came to the hospital.  She notes mild lightheadedness.  She denies fevers, chills, chest pain, shortness of breath, nausea, vomiting, abdominal pain, edema.  She denies NSAIDs.  Denies smoking, drinking.    ED Course: labs, GI consult, PPI gtt, 1 unit pRBC.  Admit to hospitalist for recurrent GI bleed.  Assessment & Plan:   Active Problems:   GI bleed   Anemia due to GI blood loss  Acute Blood Loss Anemia  Recurrent GI Bleed  Melena  Iron Deficiency Anemia:  Recently discharged 7/2, had endoscopy on that admission with ulceration at gastrojejunal anastomosis BRBPR noted shortly after discharge with 2 episodes of melena noted 7/4 No additional BM's yet, no new blood noted Protonix bolus and gtt  GI recommending CLD, NPO at midnight, IV PPI BID, serial Hb/Hct/transfuse prn, EGD, IV iron prior to d/c 1 unit pRBC ordered per EDP Hb 8.6 today (s/p 3 units pRBC during this hospitalization) (s/p 4 units pRBC during last hospitalization) Follow iron, ferritin, b12, folate -> suggestive of iron def.  Low normal B12, follow MMA.   Bradycardia  Sinus Pause: 2.05 seconds - she was awake.  Asymptomatic.  Not on any AV nodal blocking agents, will avoid these.  Follow EKG and continue to monitor.  Continue telemetry Follow EKG Consider discussion with  cards if > 3 seconds or symptomatic  Elevated Bilirubin: mild, continue to monitor   History of RA: actemra outpatient  History Gastric Bypass: continue supplements once taking PO  Hx of HTN: not on any meds, BP appropriate, follow   DVT prophylaxis: SCD Code Status: full Family Communication: none at bedside Disposition:   Status is: Observation  The patient will require care spanning > 2 midnights and should be moved to inpatient because: Inpatient level of care appropriate due to severity of illness  Dispo: The patient is from: Home              Anticipated d/c is to: Home              Anticipated d/c date is: 1 day              Patient currently is not medically stable to d/c.   Consultants:   GI   Procedures:   none  Antimicrobials:  Anti-infectives (From admission, onward)   None      Subjective: Feels ok today, happy to have something to eat/drink No BM today  Objective: Vitals:   03/18/20 0500 03/18/20 0525 03/18/20 0632 03/18/20 1423  BP:  105/73 101/73 114/65  Pulse:  (!) 53 (!) 59 61  Resp:  16 17 16   Temp:  98.1 F (36.7 C) 98.1 F (36.7 C) 98.6 F (37 C)  TempSrc:  Oral    SpO2:  99% 100% 100%  Weight: 88.9 kg  Height:        Intake/Output Summary (Last 24 hours) at 03/18/2020 1650 Last data filed at 03/18/2020 1300 Gross per 24 hour  Intake 3935.49 ml  Output -  Net 3935.49 ml   Filed Weights   03/17/20 1546 03/18/20 0500  Weight: 83 kg 88.9 kg    Examination:  General exam: Appears calm and comfortable  Respiratory system: Clear to auscultation. Respiratory effort normal. Cardiovascular system: S1 & S2 heard, RRR. Gastrointestinal system: Abdomen is nondistended, soft and nontender. Central nervous system: Alert and oriented. No focal neurological deficits. Extremities: no LEE Skin: No rashes, lesions or ulcers Psychiatry: Judgement and insight appear normal. Mood & affect appropriate.     Data Reviewed: I have  personally reviewed following labs and imaging studies  CBC: Recent Labs  Lab 03/13/20 2200 03/13/20 2200 03/14/20 0844 03/14/20 0844 03/15/20 0505 03/15/20 0505 03/17/20 1608 03/17/20 2318 03/18/20 0637 03/18/20 0958 03/18/20 1324  WBC 5.2  --  4.9  --  4.4  --  4.8  --  3.5*  --   --   HGB 6.0*   < > 7.8*   < > 7.9*   < > 6.4* 6.1* 8.0* 8.6* 8.6*  HCT 19.5*   < > 24.7*   < > 24.8*   < > 20.8* 20.0* 25.3* 26.5* 27.4*  MCV 78.9*  --  82.1  --  83.5  --  84.2  --  83.0  --   --   PLT 180  --  163  --  187  --  206  --  162  --   --    < > = values in this interval not displayed.    Basic Metabolic Panel: Recent Labs  Lab 03/13/20 1129 03/14/20 0844 03/15/20 0505 03/17/20 1608 03/18/20 0637  NA 136 140 139 139 142  K 3.2* 3.4* 3.3* 3.6 3.5  CL 106 111 112* 113* 114*  CO2 20* 20* 21* 20* 21*  GLUCOSE 93 77 91 90 92  BUN 18 13 8 10 9   CREATININE 0.87 0.94 0.96 0.88 0.91  CALCIUM 8.8* 8.7* 8.7* 8.5* 8.5*    GFR: Estimated Creatinine Clearance: 70.6 mL/min (by C-G formula based on SCr of 0.91 mg/dL).  Liver Function Tests: Recent Labs  Lab 03/13/20 0919 03/13/20 1129 03/15/20 0505 03/17/20 1608 03/18/20 0637  AST 18 17 21 18 20   ALT 14 13 14 13 14   ALKPHOS 48 43 41 43 36*  BILITOT 0.6 0.5 0.8 0.7 1.8*  PROT 5.7* 5.5* 5.1* 5.3* 4.6*  ALBUMIN 3.4* 3.2* 3.1* 3.3* 2.7*    CBG: No results for input(s): GLUCAP in the last 168 hours.   Recent Results (from the past 240 hour(s))  SARS Coronavirus 2 by RT PCR (hospital order, performed in [facility] hospital lab) Nasopharyngeal Nasopharyngeal Swab     Status: None   Collection Time: 03/13/20 12:53 PM   Specimen: Nasopharyngeal Swab  Result Value Ref Range Status   SARS Coronavirus 2 NEGATIVE NEGATIVE Final    Comment: (NOTE) SARS-CoV-2 target nucleic acids are NOT DETECTED.  The SARS-CoV-2 RNA is generally detectable in upper and lower respiratory specimens during the acute phase of infection. The lowest  concentration of SARS-CoV-2 viral copies this assay can detect is 250 copies / mL. Kasmira Cacioppo negative result does not preclude SARS-CoV-2 infection and should not be used as the sole basis for treatment or other patient management decisions.  Analiza Cowger negative result may occur with improper specimen collection /  handling, submission of specimen other than nasopharyngeal swab, presence of viral mutation(s) within the areas targeted by this assay, and inadequate number of viral copies (<250 copies / mL). Imogean Ciampa negative result must be combined with clinical observations, patient history, and epidemiological information.  Fact Sheet for Patients:   StrictlyIdeas.no  Fact Sheet for Healthcare Providers: BankingDealers.co.za  This test is not yet approved or  cleared by the [place] FDA and has been authorized for detection and/or diagnosis of SARS-CoV-2 by FDA under an Emergency Use Authorization (EUA).  This EUA will remain in effect (meaning this test can be used) for the duration of the COVID-19 declaration under Section 564(b)(1) of the Act, 21 U.S.C. section 360bbb-3(b)(1), unless the authorization is terminated or revoked sooner.  Performed at [facility], [place] [address], [place], [place] [address]   SARS Coronavirus 2 by RT PCR (hospital order, performed in [facility] hospital lab) Nasopharyngeal Nasopharyngeal Swab     Status: None   Collection Time: 03/17/20  7:18 PM   Specimen: Nasopharyngeal Swab  Result Value Ref Range Status   SARS Coronavirus 2 NEGATIVE NEGATIVE Final    Comment: (NOTE) SARS-CoV-2 target nucleic acids are NOT DETECTED.  The SARS-CoV-2 RNA is generally detectable in upper and lower respiratory specimens during the acute phase of infection. The lowest concentration of SARS-CoV-2 viral copies this assay can detect is 250 copies / mL. Alicyn Klann negative result does not preclude SARS-CoV-2 infection and should not be used as  the sole basis for treatment or other patient management decisions.  Chiquita Heckert negative result may occur with improper specimen collection / handling, submission of specimen other than nasopharyngeal swab, presence of viral mutation(s) within the areas targeted by this assay, and inadequate number of viral copies (<250 copies / mL). Aneesh Faller negative result must be combined with clinical observations, patient history, and epidemiological information.  Fact Sheet for Patients:   StrictlyIdeas.no  Fact Sheet for Healthcare Providers: BankingDealers.co.za  This test is not yet approved or  cleared by the [place] FDA and has been authorized for detection and/or diagnosis of SARS-CoV-2 by FDA under an Emergency Use Authorization (EUA).  This EUA will remain in effect (meaning this test can be used) for the duration of the COVID-19 declaration under Section 564(b)(1) of the Act, 21 U.S.C. section 360bbb-3(b)(1), unless the authorization is terminated or revoked sooner.  Performed at [facility], [place] [address] [place], [place] [address]          Radiology Studies: No results found.      Scheduled Meds: Continuous Infusions: . sodium chloride    . dextrose 5% lactated ringers 75 mL/hr at 03/17/20 2208  . pantoprozole (PROTONIX) infusion 8 mg/hr (03/18/20 0625)     LOS: 0 days    Time spent: over 30 min    Fayrene Helper, MD [facility]   To contact the attending provider between 7A-7P or the covering provider during after hours 7P-7A, please log into the web site [email or web address] and access using universal [place] password for that web site. If you do not have the password, please call the hospital operator.  03/18/2020, 4:50 PM

## 2020-03-19 ENCOUNTER — Encounter (HOSPITAL_COMMUNITY): Payer: Self-pay | Admitting: Family Medicine

## 2020-03-19 ENCOUNTER — Encounter (HOSPITAL_COMMUNITY)
Admission: EM | Disposition: A | Discharge status: Home / Self Care | Payer: Self-pay | Source: Home / Self Care | Attending: Family Medicine

## 2020-03-19 ENCOUNTER — Inpatient Hospital Stay (HOSPITAL_COMMUNITY): Payer: Medicare Other | Admitting: Certified Registered Nurse Anesthetist

## 2020-03-19 HISTORY — PX: HEMOSTASIS CLIP PLACEMENT: SHX6857

## 2020-03-19 HISTORY — PX: SCLEROTHERAPY: SHX6841

## 2020-03-19 HISTORY — PX: ESOPHAGOGASTRODUODENOSCOPY (EGD) WITH PROPOFOL: SHX5813

## 2020-03-19 LAB — TYPE AND SCREEN
ABO/RH(D): A POS
Antibody Screen: NEGATIVE
Unit division: 0
Unit division: 0
Unit division: 0

## 2020-03-19 LAB — HEMOGLOBIN AND HEMATOCRIT, BLOOD
HCT: 28.3 % — ABNORMAL LOW (ref 36.0–46.0)
HCT: 24.1 % — ABNORMAL LOW (ref 36.0–46.0)
Hemoglobin: 9 g/dL — ABNORMAL LOW (ref 12.0–15.0)
Hemoglobin: 7.6 g/dL — ABNORMAL LOW (ref 12.0–15.0)

## 2020-03-19 LAB — CBC WITH DIFFERENTIAL/PLATELET
Abs Immature Granulocytes: 0.02 10*3/uL (ref 0.00–0.07)
Basophils Absolute: 0 10*3/uL (ref 0.0–0.1)
Basophils Relative: 0 %
Eosinophils Absolute: 0.2 10*3/uL (ref 0.0–0.5)
Eosinophils Relative: 4 %
HCT: 28.1 % — ABNORMAL LOW (ref 36.0–46.0)
Hemoglobin: 8.7 g/dL — ABNORMAL LOW (ref 12.0–15.0)
Immature Granulocytes: 1 %
Lymphocytes Relative: 27 %
Lymphs Abs: 1.2 10*3/uL (ref 0.7–4.0)
MCH: 25.4 pg — ABNORMAL LOW (ref 26.0–34.0)
MCHC: 31 g/dL (ref 30.0–36.0)
MCV: 82.2 fL (ref 80.0–100.0)
Monocytes Absolute: 0.5 10*3/uL (ref 0.1–1.0)
Monocytes Relative: 11 %
Neutro Abs: 2.4 10*3/uL (ref 1.7–7.7)
Neutrophils Relative %: 57 %
Platelets: 189 10*3/uL (ref 150–400)
RBC: 3.42 MIL/uL — ABNORMAL LOW (ref 3.87–5.11)
RDW: 19 % — ABNORMAL HIGH (ref 11.5–15.5)
WBC: 4.3 10*3/uL (ref 4.0–10.5)
nRBC: 0 % (ref 0.0–0.2)

## 2020-03-19 LAB — PHOSPHORUS: Phosphorus: 4.1 mg/dL (ref 2.5–4.6)

## 2020-03-19 LAB — COMPREHENSIVE METABOLIC PANEL
ALT: 15 U/L (ref 0–44)
AST: 18 U/L (ref 15–41)
Albumin: 3 g/dL — ABNORMAL LOW (ref 3.5–5.0)
Alkaline Phosphatase: 38 U/L (ref 38–126)
Anion gap: 8 (ref 5–15)
BUN: 5 mg/dL — ABNORMAL LOW (ref 6–20)
CO2: 23 mmol/L (ref 22–32)
Calcium: 8.7 mg/dL — ABNORMAL LOW (ref 8.9–10.3)
Chloride: 112 mmol/L — ABNORMAL HIGH (ref 98–111)
Creatinine, Ser: 0.82 mg/dL (ref 0.44–1.00)
GFR calc Af Amer: >60 mL/min (ref >60)
GFR calc non Af Amer: >60 mL/min (ref >60)
Glucose, Bld: 89 mg/dL (ref 70–99)
Potassium: 3.2 mmol/L — ABNORMAL LOW (ref 3.5–5.1)
Sodium: 143 mmol/L (ref 135–145)
Total Bilirubin: 0.9 mg/dL (ref 0.3–1.2)
Total Protein: 4.9 g/dL — ABNORMAL LOW (ref 6.5–8.1)

## 2020-03-19 LAB — BPAM RBC
Blood Product Expiration Date: 202107072359
Blood Product Expiration Date: 202107102359
Blood Product Expiration Date: 202108012359
ISSUE DATE / TIME: 202107041830
ISSUE DATE / TIME: 202107050043
ISSUE DATE / TIME: 202107050309
Unit Type and Rh: 600
Unit Type and Rh: 600
Unit Type and Rh: 6200

## 2020-03-19 LAB — MAGNESIUM: Magnesium: 1.6 mg/dL — ABNORMAL LOW (ref 1.7–2.4)

## 2020-03-19 SURGERY — ESOPHAGOGASTRODUODENOSCOPY (EGD) WITH PROPOFOL
Anesthesia: Monitor Anesthesia Care

## 2020-03-19 MED ORDER — MAGNESIUM SULFATE 2 GM/50ML IV SOLN
2.0000 g | Freq: Once | INTRAVENOUS | Status: AC
  Administered 2020-03-19: 2 g via INTRAVENOUS
  Filled 2020-03-19: qty 50

## 2020-03-19 MED ORDER — SODIUM CHLORIDE 0.9 % IV SOLN
510.0000 mg | Freq: Once | INTRAVENOUS | Status: AC
  Administered 2020-03-19: 510 mg via INTRAVENOUS
  Filled 2020-03-19: qty 17

## 2020-03-19 MED ORDER — SODIUM CHLORIDE (PF) 0.9 % IJ SOLN
PREFILLED_SYRINGE | INTRAMUSCULAR | Status: DC | PRN
  Administered 2020-03-19: 5 mL

## 2020-03-19 MED ORDER — EPINEPHRINE 1 MG/10ML IJ SOSY
PREFILLED_SYRINGE | INTRAMUSCULAR | Status: AC
  Filled 2020-03-19: qty 10

## 2020-03-19 MED ORDER — ONDANSETRON HCL 4 MG/2ML IJ SOLN
INTRAMUSCULAR | Status: DC | PRN
Start: 2020-03-19 — End: 2020-03-19
  Administered 2020-03-19: 4 mg via INTRAVENOUS

## 2020-03-19 MED ORDER — DEXTROSE IN LACTATED RINGERS 5 % IV SOLN
INTRAVENOUS | Status: DC | PRN
Start: 2020-03-19 — End: 2020-03-19

## 2020-03-19 MED ORDER — POTASSIUM CHLORIDE 10 MEQ/100ML IV SOLN
INTRAVENOUS | Status: AC
  Filled 2020-03-19: qty 300

## 2020-03-19 MED ORDER — SODIUM CHLORIDE 0.9 % IV SOLN: INTRAVENOUS | Status: DC

## 2020-03-19 MED ORDER — PROPOFOL 500 MG/50ML IV EMUL
INTRAVENOUS | Status: DC | PRN
  Administered 2020-03-19: 150 ug/kg/min via INTRAVENOUS

## 2020-03-19 MED ORDER — POTASSIUM CHLORIDE 10 MEQ/100ML IV SOLN
10.0000 meq | INTRAVENOUS | Status: AC
  Administered 2020-03-19 (×3): 10 meq via INTRAVENOUS

## 2020-03-19 SURGICAL SUPPLY — 15 items

## 2020-03-19 NOTE — Anesthesia Procedure Notes (Signed)
Procedure Name: MAC Date/Time: 03/19/2020 12:20 PM Performed by: Candis Shine, CRNA Pre-anesthesia Checklist: Patient identified, Emergency Drugs available, Suction available, Patient being monitored and Timeout performed Oxygen Delivery Method: Nasal cannula Dental Injury: Teeth and Oropharynx as per pre-operative assessment

## 2020-03-19 NOTE — Progress Notes (Signed)
GASTROENTEROLOGY ROUNDING NOTE Subjective: I came to check on the patient this evening after she had an EGD with placement of hemoclips and epinephrine injection to control massive GI hemorrhage from a visible vessel noted an anastomotic ulcer.  Patient has done really well after the procedure is tolerated clear liquid diet well.  She denies having any nausea, abdominal pain, melena OR hematochezia.  She receiving an iron infusion at the present time.  Objective: Vital signs in last 24 hours: Temp:  [98 F (36.7 C)-98.9 F (37.2 C)] 98.7 F (37.1 C) (07/06 1252) Pulse Rate:  [58-81] 62 (07/06 1312) Resp:  [11-20] 11 (07/06 1312) BP: (107-151)/(62-75) 151/65 (07/06 1312) SpO2:  [99 %-100 %] 100 % (07/06 1312) Weight:  [88.7 kg] 88.7 kg (07/06 0500) Last BM Date: 03/18/20 General: NAD Lungs: Clear to auscultation Heart: S1-S2 regular  Abdomen: Nontender with normal bowel sounds.  Intake/Output from previous day: 07/05 0701 - 07/06 0700 In: 700 [P.O.:700] Out: -  Intake/Output this shift: No intake/output data recorded.  Lab Results: Recent Labs    03/17/20 1608 03/17/20 2318 03/18/20 2263 03/18/20 0958 03/18/20 2108 03/19/20 0723 03/19/20 1402  WBC 4.8  --  3.5*  --   --  4.3  --   HGB 6.4*   < > 8.0*   < > 8.9* 8.7* 9.0*  PLT 206  --  162  --   --  189  --   MCV 84.2  --  83.0  --   --  82.2  --    < > = values in this interval not displayed.   BMET Recent Labs    03/17/20 1608 03/18/20 0637 03/19/20 0723  NA 139 142 143  K 3.6 3.5 3.2*  CL 113* 114* 112*  CO2 20* 21* 23  GLUCOSE 90 92 89  BUN 10 9 5*  CREATININE 0.88 0.91 0.82  CALCIUM 8.5* 8.5* 8.7*   LFT Recent Labs    03/17/20 1608 03/18/20 0637 03/19/20 0723  PROT 5.3* 4.6* 4.9*  ALBUMIN 3.3* 2.7* 3.0*  AST 18 20 18   ALT 13 14 15   ALKPHOS 43 36* 38  BILITOT 0.7 1.8* 0.9   PT/INR No results for input(s): INR in the last 72 hours.  Imaging/Other results: No results  found.  Assessment/Plan Upper GI hemorrhage from visible vessel in an ulcer at the anastomosis-injected with epinephrine and hemoclipped; additional area of bleeding noted from a second ulcer at the anastomosis as well that was clipped x 2. I have discussed her case with Dr. Pascal Lux in interventional radiology and with Dr. Rosendo Gros for [facility] surgery. I have also informed the patient's nurse that if she were to develop rectal bleeding hematemesis hypotension or tachycardia that I would like to be informed ASAP and that the patient would need to be transferred to a stepdown or ICU bed. Patient is aware of the backup calls I have made to Dr. Shon Baton Dr. Rosendo Gros in case she were to rebleed. I have discussed her situation personally with Dr. Willey Blade her PCP who is aware of the events since admission. She will need to be kept on IV PPI for the next 72 hours.   Juanita Craver, MD  03/19/2020, 7:08 PM

## 2020-03-19 NOTE — Anesthesia Postprocedure Evaluation (Signed)
Anesthesia Post Note  Patient: Mackenzie Rivera  Procedure(s) Performed: ESOPHAGOGASTRODUODENOSCOPY (EGD) WITH PROPOFOL (N/A ) SCLEROTHERAPY HEMOSTASIS CLIP PLACEMENT     Patient location during evaluation: Endoscopy Anesthesia Type: MAC Level of consciousness: awake and alert Pain management: pain level controlled Vital Signs Assessment: post-procedure vital signs reviewed and stable Respiratory status: spontaneous breathing, nonlabored ventilation, respiratory function stable and patient connected to nasal cannula oxygen Cardiovascular status: blood pressure returned to baseline and stable Postop Assessment: no apparent nausea or vomiting Anesthetic complications: no   No complications documented.  Last Vitals:  Vitals:   03/19/20 1252 03/19/20 1312  BP: 140/75 (!) 151/65  Pulse: 81 62  Resp: 17 11  Temp: 37.1 C   SpO2: 100% 100%    Last Pain:  Vitals:   03/19/20 1252  TempSrc: Oral  PainSc: 4                  Azari Janssens L Gioia Ranes

## 2020-03-19 NOTE — Op Note (Addendum)
[name] Patient Name: Mackenzie Rivera Procedure Date : 03/19/2020 MRN: [id number] Attending MD: Juanita Craver , MD Date of Birth: [birth year] CSN: [id number] Age: 59 Admit Type: Inpatient Procedure:                EGD with control of bleeding. 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 Providers:                Juanita Craver, MD, Benetta Spar RN, Carmie End,                            RN, Cherylynn Ridges, Technician, Tressia Miners,                            CRNA, Dellie Catholic, CRNA Referring MD:             Royetta Crochet. Karlton Lemon, MD Medicines:                Monitored Anesthesia Care Complications:            No immediate complications. Estimated Blood Loss:     Estimated blood loss: 25 mL. Procedure:                Pre-Anesthesia Assessment: - Prior to the                            procedure, a history and physical was performed,                            and patient medications and allergies were                            reviewed. The patient's tolerance of previous                            anesthesia was also reviewed. The risks and                            benefits of the procedure and the sedation options                            and risks were discussed with the patient. All                            questions were answered, and informed consent was                            obtained. Prior Anticoagulants: The patient has  taken no previous anticoagulant or antiplatelet                            agents. ASA Grade Assessment: II - A patient with                            mild systemic disease. After reviewing the risks                            and benefits, the patient was deemed in                            satisfactory condition to undergo the procedure.                             After obtaining informed consent, the endoscope was                            passed under direct vision. Throughout the                            procedure, the patient's blood pressure, pulse, and                            oxygen saturations were monitored continuously. The                            GIF-H190 ([id number]) Olympus gastroscope was                            introduced through the mouth, and advanced to the                            second part of duodenum. The EGD was performed with                            moderate difficulty due to excessive bleeding.                            Successful completion of the procedure was aided by                            controlling the bleeding. The patient tolerated the                            procedure well. Scope In: Scope Out: Findings:      The examined esophagus and GEJ appeared widely patent and normal.      The patient has post-op changes consistent with a Rou-en-Y gastric       bypass. A 8 mm anastamotic ulcers were seen; one with a clot that was       washed and appeared to be oozing-Forrest Ib ; two clips were placed on       this and bleedingw as controlled. another 5-6 mm  ulcer was noted with an       actively bleeding visible vessel at the margin of the       anastamosis-Forrest 1a; the base was this ulcer was injected with 5 cc's       of 1:10, 000 Epinephrine and two clips were applied; hemostasis was       achieved; two hemostatic clips were successfully placed (MR unsafe);       there was no bleeding at the end of the procedure.      The afferent and efferent limb was not examined as this was done less       than a week ago. Impression:               - Normal appearing, widely patent esophagus and                            GEJ; SCJ was measured at 40 cm.                           - Oozing 8 mm gastric ulcers with adherent                            clot-Forrest 1b ; injected with  epinephrine and 2                            hemoclips (MR unsafe) were placed-hemostasis                            achieved.                           - Another 5-6 mm ulcer with an actively bleeding                            visible vessel at the margin of the                            anastamosis-Forrest 1a -2 MR unsafe hemoclips                            placed-hemostasis achieved.                           - No specimens collected. Moderate Sedation:      MAC used. Recommendation:           - NPO today; may allow ice chips this evening.                           - Continue present medications-IV PPI's for 72                            hours.                           - Serial CBC's.                           -  No Aspirin, Ibuprofen, Naproxen, or other                            non-steroidal anti-inflammatory drugs.                           - Will inform IR and the surgical service for                            backup if needed. Procedure Code(s):        --- Professional ---                           [phone], Esophagogastroduodenoscopy, flexible,                            transoral; with control of bleeding, any method                           43236, 59, Esophagogastroduodenoscopy, flexible,                            transoral; with directed submucosal injection(s),                            any substance Diagnosis Code(s):        --- Professional ---                           K25.4, Chronic or unspecified gastric ulcer with                            hemorrhage                           D50.9, Iron deficiency anemia, unspecified                           K92.1, Melena (includes Hematochezia) CPT copyright 2019 [employer] Association. All rights reserved. The codes documented in this report are preliminary and upon coder review may  be revised to meet current compliance requirements. Juanita Craver, MD Juanita Craver, MD 03/19/2020 1:14:36 PM This report has been signed  electronically. Number of Addenda: 0

## 2020-03-19 NOTE — Progress Notes (Signed)
PROGRESS NOTE    Mackenzie Rivera  EHM:[id number] DOB: 15-Mar-1961 DOA: 03/17/2020 PCP: Willey Blade, MD   Chief Complaint  Patient presents with  . Dizziness  . Melena    Brief Narrative:  Mackenzie Rivera is Mackenzie Rivera 59 y.o. female with medical history significant of RA (on actemra), gastric bypass, GERD, hypertension, and GI bleed with recent admission requiring 4 units pRBC for anemia and EGD which showed an ulcer at the gastrojejunal anastamosis.  She was discharged on Harace Mccluney PPI and sucralfate.  She's represented today with continued GI bleeding.  She notes BRBPR that she noted after getting home from being discharged.  Today she noticed melena x2 so she came to the hospital.  She notes mild lightheadedness.  She denies fevers, chills, chest pain, shortness of breath, nausea, vomiting, abdominal pain, edema.  She denies NSAIDs.  Denies smoking, drinking.    ED Course: labs, GI consult, PPI gtt, 1 unit pRBC.  Admit to hospitalist for recurrent GI bleed.  Assessment & Plan:   Active Problems:   GI bleed   Anemia due to GI blood loss  Acute Blood Loss Anemia  Recurrent GI Bleed  Melena  Iron Deficiency Anemia:  Recently discharged 7/2, had endoscopy on that admission with ulceration at gastrojejunal anastomosis BRBPR noted shortly after discharge with 2 episodes of melena noted 7/4 No additional BM's yet, no new blood noted Protonix bolus and gtt  GI recommending CLD, NPO at midnight, IV PPI BID, serial Hb/Hct/transfuse prn, EGD, IV iron prior to d/c EGD with oozing 8 mm gastric ulcer with adherent clot - injected with epi - 2 hemoclips (MR unsafe) placed - 5-6 mm ulcer with actively bleeding visible vessel at the margin of anastomosis - 2 hemoclips placed (MR unsafe) - hemostasis achieved IV PPI x 72 hrs, trend H/H No ASA, NSAIDs, etc NPO today, ice chips ok this evening Hb 9 today (s/p 3 units pRBC during this hospitalization) (s/p 4 units pRBC during last hospitalization) Follow iron, ferritin,  b12, folate -> suggestive of iron def - will give IV iron today.  Low normal B12, follow MMA.   Bradycardia  Sinus Pause: 2.05 seconds - she was awake.  Asymptomatic.  Not on any AV nodal blocking agents, will avoid these.  Follow EKG and continue to monitor.  Continue telemetry Follow EKG, sinus brady Consider discussion with cards if > 3 seconds or symptomatic  Elevated Bilirubin: mild, continue to monitor - resolved  History of RA: actemra outpatient  History Gastric Bypass: continue supplements once taking PO  Hx of HTN: not on any meds, BP appropriate, follow   Hypokalemia  Hypomagnesemic: replace and follow    DVT prophylaxis: SCD Code Status: full Family Communication: none at bedside Disposition:   Status is: Observation  The patient will require care spanning > 2 midnights and should be moved to inpatient because: Inpatient level of care appropriate due to severity of illness  Dispo: The patient is from: Home              Anticipated d/c is to: Home              Anticipated d/c date is: 1 day              Patient currently is not medically stable to d/c.   Consultants:   GI   Procedures:  EGD Impression - Normal appearing, widely patent esophagus and GEJ; SCJ was measured at 40 cm. - Oozing 8 mm gastric ulcers with adherent  clot; injected with epinephrine and 2 hemoclips (MR unsafe) were placed-hemostasis achieved. - Another 5-6 mm ulcer with an actively bleeding visible vessel at the margin of the anastamosis-2 MR unsafe hemoclips placed-hemostasis achieved. - No specimens collected. Recommendation - NPO today; may allow ice chips this evening. - Continue present medications-IV PPI's for 72 hours. - Serial CBC's. - No Aspirin, Ibuprofen, Naproxen, or other non-steroidal anti-inflammatory drugs.  Antimicrobials:  Anti-infectives (From admission, onward)   None      Subjective: Feels ok today, happy to have something to eat/drink No BM  today  Objective: Vitals:   03/19/20 0500 03/19/20 1136 03/19/20 1252 03/19/20 1312  BP:  119/62 140/75 (!) 151/65  Pulse:   81 62  Resp:  20 17 11   Temp:  98.9 F (37.2 C) 98.7 F (37.1 C)   TempSrc:  Oral Oral   SpO2:  100% 100% 100%  Weight: 88.7 kg     Height:        Intake/Output Summary (Last 24 hours) at 03/19/2020 1631 Last data filed at 03/19/2020 1244 Gross per 24 hour  Intake 500 ml  Output --  Net 500 ml   Filed Weights   03/17/20 1546 03/18/20 0500 03/19/20 0500  Weight: 83 kg 88.9 kg 88.7 kg    Examination:  General exam: Appears calm and comfortable  Respiratory system: Clear to auscultation. Respiratory effort normal. Cardiovascular system: S1 & S2 heard, RRR. Gastrointestinal system: Abdomen is nondistended, soft and nontender. Central nervous system: Alert and oriented. No focal neurological deficits. Extremities: no LEE Skin: No rashes, lesions or ulcers Psychiatry: Judgement and insight appear normal. Mood & affect appropriate.     Data Reviewed: I have personally reviewed following labs and imaging studies  CBC: Recent Labs  Lab 03/14/20 0844 03/14/20 0844 03/15/20 0505 03/15/20 0505 03/17/20 1608 03/17/20 2318 03/18/20 8786 03/18/20 7672 03/18/20 0958 03/18/20 1324 03/18/20 2108 03/19/20 0723 03/19/20 1402  WBC 4.9  --  4.4  --  4.8  --  3.5*  --   --   --   --  4.3  --   NEUTROABS  --   --   --   --   --   --   --   --   --   --   --  2.4  --   HGB 7.8*   < > 7.9*   < > 6.4*   < > 8.0*   < > 8.6* 8.6* 8.9* 8.7* 9.0*  HCT 24.7*   < > 24.8*   < > 20.8*   < > 25.3*   < > 26.5* 27.4* 27.9* 28.1* 28.3*  MCV 82.1  --  83.5  --  84.2  --  83.0  --   --   --   --  82.2  --   PLT 163  --  187  --  206  --  162  --   --   --   --  189  --    < > = values in this interval not displayed.    Basic Metabolic Panel: Recent Labs  Lab 03/14/20 0844 03/15/20 0505 03/17/20 1608 03/18/20 0637 03/19/20 0723  NA 140 139 139 142 143  K 3.4*  3.3* 3.6 3.5 3.2*  CL 111 112* 113* 114* 112*  CO2 20* 21* 20* 21* 23  GLUCOSE 77 91 90 92 89  BUN 13 8 10 9  5*  CREATININE 0.94 0.96 0.88 0.91 0.82  CALCIUM 8.7*  8.7* 8.5* 8.5* 8.7*  MG  --   --   --   --  1.6*  PHOS  --   --   --   --  4.1    GFR: Estimated Creatinine Clearance: 78.3 mL/min (by C-G formula based on SCr of 0.82 mg/dL).  Liver Function Tests: Recent Labs  Lab 03/13/20 1129 03/15/20 0505 03/17/20 1608 03/18/20 0637 03/19/20 0723  AST 17 21 18 20 18   ALT 13 14 13 14 15   ALKPHOS 43 41 43 36* 38  BILITOT 0.5 0.8 0.7 1.8* 0.9  PROT 5.5* 5.1* 5.3* 4.6* 4.9*  ALBUMIN 3.2* 3.1* 3.3* 2.7* 3.0*    CBG: No results for input(s): GLUCAP in the last 168 hours.   Recent Results (from the past 240 hour(s))  SARS Coronavirus 2 by RT PCR (hospital order, performed in [facility] hospital lab) Nasopharyngeal Nasopharyngeal Swab     Status: None   Collection Time: 03/13/20 12:53 PM   Specimen: Nasopharyngeal Swab  Result Value Ref Range Status   SARS Coronavirus 2 NEGATIVE NEGATIVE Final    Comment: (NOTE) SARS-CoV-2 target nucleic acids are NOT DETECTED.  The SARS-CoV-2 RNA is generally detectable in upper and lower respiratory specimens during the acute phase of infection. The lowest concentration of SARS-CoV-2 viral copies this assay can detect is 250 copies / mL. Anushri Casalino negative result does not preclude SARS-CoV-2 infection and should not be used as the sole basis for treatment or other patient management decisions.  Elisia Stepp negative result may occur with improper specimen collection / handling, submission of specimen other than nasopharyngeal swab, presence of viral mutation(s) within the areas targeted by this assay, and inadequate number of viral copies (<250 copies / mL). Elizet Kaplan negative result must be combined with clinical observations, patient history, and epidemiological information.  Fact Sheet for Patients:   StrictlyIdeas.no  Fact Sheet  for Healthcare Providers: BankingDealers.co.za  This test is not yet approved or  cleared by the [place] FDA and has been authorized for detection and/or diagnosis of SARS-CoV-2 by FDA under an Emergency Use Authorization (EUA).  This EUA will remain in effect (meaning this test can be used) for the duration of the COVID-19 declaration under Section 564(b)(1) of the Act, 21 U.S.C. section 360bbb-3(b)(1), unless the authorization is terminated or revoked sooner.  Performed at [facility], [place] [address], [place], [place] [address]   SARS Coronavirus 2 by RT PCR (hospital order, performed in [facility] hospital lab) Nasopharyngeal Nasopharyngeal Swab     Status: None   Collection Time: 03/17/20  7:18 PM   Specimen: Nasopharyngeal Swab  Result Value Ref Range Status   SARS Coronavirus 2 NEGATIVE NEGATIVE Final    Comment: (NOTE) SARS-CoV-2 target nucleic acids are NOT DETECTED.  The SARS-CoV-2 RNA is generally detectable in upper and lower respiratory specimens during the acute phase of infection. The lowest concentration of SARS-CoV-2 viral copies this assay can detect is 250 copies / mL. Diana Davenport negative result does not preclude SARS-CoV-2 infection and should not be used as the sole basis for treatment or other patient management decisions.  Kristene Liberati negative result may occur with improper specimen collection / handling, submission of specimen other than nasopharyngeal swab, presence of viral mutation(s) within the areas targeted by this assay, and inadequate number of viral copies (<250 copies / mL). Dorothymae Maciver negative result must be combined with clinical observations, patient history, and epidemiological information.  Fact Sheet for Patients:   StrictlyIdeas.no  Fact Sheet for  Healthcare Providers: BankingDealers.co.za  This test is not yet approved or  cleared by the [place] and has been authorized for  detection and/or diagnosis of SARS-CoV-2 by FDA under an Emergency Use Authorization (EUA).  This EUA will remain in effect (meaning this test can be used) for the duration of the COVID-19 declaration under Section 564(b)(1) of the Act, 21 U.S.C. section 360bbb-3(b)(1), unless the authorization is terminated or revoked sooner.  Performed at [facility], [place] [address], [place], [place] [address]          Radiology Studies: No results found.      Scheduled Meds: . pantoprazole (PROTONIX) IV  40 mg Intravenous Q12H   Continuous Infusions:    LOS: 1 day    Time spent: over 30 min    Fayrene Helper, MD [facility]   To contact the attending provider between 7A-7P or the covering provider during after hours 7P-7A, please log into the web site [email or web address] and access using universal [place] password for that web site. If you do not have the password, please call the hospital operator.  03/19/2020, 4:31 PM

## 2020-03-19 NOTE — Anesthesia Preprocedure Evaluation (Addendum)
Anesthesia Evaluation  Patient identified by MRN, date of birth, ID band Patient awake    Reviewed: Allergy & Precautions, NPO status , Patient's Chart, lab work & pertinent test results  Airway Mallampati: I  TM Distance: >3 FB Neck ROM: Full    Dental no notable dental hx. (+) Teeth Intact, Dental Advisory Given   Pulmonary neg pulmonary ROS, former smoker,    Pulmonary exam normal breath sounds clear to auscultation       Cardiovascular hypertension, negative cardio ROS Normal cardiovascular exam Rhythm:Regular Rate:Normal     Neuro/Psych negative neurological ROS  negative psych ROS   GI/Hepatic Neg liver ROS, GERD  ,H/o gastric bypass   Endo/Other  negative endocrine ROSObese BMI 36  Renal/GU negative Renal ROS  negative genitourinary   Musculoskeletal  (+) Arthritis , Rheumatoid disorders,    Abdominal   Peds  Hematology  (+) Blood dyscrasia (Hgb 8.7), anemia ,   Anesthesia Other Findings Known ulcer at [place] anastamosis. S/p 3U RBCs  Reproductive/Obstetrics                            Anesthesia Physical Anesthesia Plan  ASA: III  Anesthesia Plan: MAC   Post-op Pain Management:    Induction: Intravenous  PONV Risk Score and Plan: 2 and Propofol infusion and Treatment may vary due to age or medical condition  Airway Management Planned: Natural Airway  Additional Equipment:   Intra-op Plan:   Post-operative Plan:   Informed Consent: I have reviewed the patients History and Physical, chart, labs and discussed the procedure including the risks, benefits and alternatives for the proposed anesthesia with the patient or authorized representative who has indicated his/her understanding and acceptance.     Dental advisory given  Plan Discussed with: CRNA  Anesthesia Plan Comments:         Anesthesia Quick Evaluation

## 2020-03-19 NOTE — Consult Note (Signed)
Reason for Consult: Gastric bypass ulcer, upper GI bleed Referring Physician: Dr. Jacquelin Hawking is an 59 y.o. female.  HPI: Patient is a 59 year old female, with a history of gastric bypass 17 years ago, who comes in with upper GI bleed.  Patient states that she was here recently secondary to upper GI bleed underwent endoscopy found to have an ulcer.  Patient presented with melena.  Patient was anemic on admission and underwent PRBC transfusion.    Patient rebled after being discharged and came back into the hospital.  Patient was found to have a visible vessel on endoscopy which was clipped x4 and injected with epinephrine.  Of note patient states that she had this previously approximate 1 year ago.  She states that this was secondary to increased ibuprofen.  She states that she is stop taking ibuprofen has been taken Tylenol for her RA pains.  General surgery was consulted for possible further bleeding issues.    Past Medical History:  Diagnosis Date  . Abnormal pap   . Amenorrhea    s/p ablation  . Anovulation    h/o chronic   . GERD (gastroesophageal reflux disease)   . H/O dysmenorrhea   . History of anemia   . HPV in female   . HSV-2 infection   . Hx of menorrhagia   . Hypertension   . Mastodynia    h/o  . Morbid obesity ([place])   . Obesity   . Osteoarthritis   . Pelvic pain in female    h/o  . Rheumatoid arthritis(714.0)   . Yeast infection    h/o- on pap smear    Past Surgical History:  Procedure Laterality Date  . BIOPSY  03/14/2020   Procedure: BIOPSY;  Surgeon: Juanita Craver, MD;  Location: [facility];  Service: Gastroenterology;;  . COLONOSCOPY  08/29/2012   Procedure: COLONOSCOPY;  Surgeon: Juanita Craver, MD;  Location: [facility];  Service: Endoscopy;  Laterality: N/A;  . COLONOSCOPY N/A 12/24/2017   Procedure: COLONOSCOPY;  Surgeon: Carol Ada, MD;  Location: [facility];  Service: Endoscopy;  Laterality: N/A;  . ENDOMETRIAL ABLATION W/ NOVASURE   11/2007  . ESOPHAGOGASTRODUODENOSCOPY N/A 12/24/2017   Procedure: ESOPHAGOGASTRODUODENOSCOPY (EGD);  Surgeon: Carol Ada, MD;  Location: [facility];  Service: Endoscopy;  Laterality: N/A;  . ESOPHAGOGASTRODUODENOSCOPY (EGD) WITH PROPOFOL N/A 03/14/2020   Procedure: ESOPHAGOGASTRODUODENOSCOPY (EGD) WITH PROPOFOL;  Surgeon: Juanita Craver, MD;  Location: [facility] ENDOSCOPY;  Service: Gastroenterology;  Laterality: N/A;  . GASTRIC BYPASS  02/2003  . KNEE ARTHROSCOPY  07/2006   Bilateral  . TOTAL KNEE ARTHROPLASTY  08/2007   Left  . TOTAL KNEE ARTHROPLASTY  07/2007   Right    Family History  Problem Relation Age of Onset  . Kidney disease Father   . Diabetes Maternal Uncle   . Diabetes Paternal Uncle   . Colon cancer Neg Hx     Social History:  reports that she quit smoking about 10 years ago. She has never used smokeless tobacco. She reports current alcohol use. She reports that she does not use drugs.  Allergies: No Known Allergies  Medications: I have reviewed the patient's current medications.  Results for orders placed or performed during the hospital encounter of 03/17/20 (from the past 48 hour(s))  Iron and TIBC     Status: Abnormal   Collection Time: 03/17/20 11:18 PM  Result Value Ref Range   Iron 9 (L) 28 - 170 ug/dL   TIBC 400 250 - 450 ug/dL  Saturation Ratios 2 (L) 10.4 - 31.8 %   UIBC 391 ug/dL    Comment: Performed at [facility], [place] [address], [place], [place] [address]  Vitamin B12     Status: None   Collection Time: 03/17/20 11:18 PM  Result Value Ref Range   Vitamin B-12 260 180 - 914 pg/mL    Comment: (NOTE) This assay is not validated for testing neonatal or myeloproliferative syndrome specimens for Vitamin B12 levels. Performed at [facility], [place] [address], [place], [place] [address]   Ferritin     Status: Abnormal   Collection Time: 03/17/20 11:18 PM  Result Value Ref Range   Ferritin 4 (L) 11 - 307 ng/mL    Comment: Performed at [facility], [place] [address], [place], [place] [address]  Hemoglobin and hematocrit, blood     Status: Abnormal   Collection Time: 03/17/20 11:18 PM  Result Value Ref Range   Hemoglobin 6.1 (LL) 12.0 - 15.0 g/dL    Comment: REPEATED TO VERIFY THIS CRITICAL RESULT HAS VERIFIED AND BEEN CALLED TO JULIE ALLEN RN BY MARSHA GARRETT ON 07 04 2021 AT 2356, AND HAS BEEN READ BACK.     HCT 20.0 (L) 36 - 46 %    Comment: Performed at [facility], [place] [address] [place], [place] [address]  Folate     Status: None   Collection Time: 03/17/20 11:18 PM  Result Value Ref Range   Folate 18.5 >5.9 ng/mL    Comment: Performed at [facility] [address], [place], [place] [address]  Prepare RBC (crossmatch)     Status: None   Collection Time: 03/18/20 12:14 AM  Result Value Ref Range   Order Confirmation      ORDER PROCESSED BY BLOOD BANK Performed at [facility], [place] [address], [place], [place] [address]   CBC     Status: Abnormal   Collection Time: 03/18/20  6:37 AM  Result Value Ref Range   WBC 3.5 (L) 4.0 - 10.5 K/uL   RBC 3.05 (L) 3.87 - 5.11 MIL/uL   Hemoglobin 8.0 (L) 12.0 - 15.0 g/dL    Comment: REPEATED TO VERIFY POST TRANSFUSION SPECIMEN    HCT 25.3 (L) 36 - 46 %   MCV 83.0 80.0 - 100.0 fL   MCH 26.2 26.0 - 34.0 pg   MCHC 31.6 30.0 - 36.0 g/dL   RDW 18.8 (H) 11.5 - 15.5 %   Platelets 162 150 - 400 K/uL   nRBC 0.0 0.0 - 0.2 %    Comment: Performed at [facility], [place] [address], [place], [place] [address]  Comprehensive metabolic panel     Status: Abnormal   Collection Time: 03/18/20  6:37 AM  Result Value Ref Range   Sodium 142 135 - 145 mmol/L   Potassium 3.5 3.5 - 5.1 mmol/L   Chloride 114 (H) 98 - 111 mmol/L   CO2 21 (L) 22 - 32 mmol/L   Glucose, Bld 92 70 - 99 mg/dL    Comment: Glucose reference range applies only to samples taken after fasting for at least 8 hours.   BUN 9 6 - 20 mg/dL   Creatinine, Ser 0.91 0.44 - 1.00 mg/dL   Calcium  8.5 (L) 8.9 - 10.3 mg/dL   Total Protein 4.6 (L) 6.5 - 8.1 g/dL   Albumin 2.7 (L) 3.5 - 5.0 g/dL   AST 20 15 - 41  U/L   ALT 14 0 - 44 U/L   Alkaline Phosphatase 36 (L) 38 - 126 U/L   Total Bilirubin 1.8 (H) 0.3 - 1.2 mg/dL   GFR calc non Af Amer >60 >60 mL/min   GFR calc Af Amer >60 >60 mL/min   Anion gap 7 5 - 15    Comment: Performed at [facility] [address], [place], [place] [address]  Hemoglobin and hematocrit, blood     Status: Abnormal   Collection Time: 03/18/20  9:58 AM  Result Value Ref Range   Hemoglobin 8.6 (L) 12.0 - 15.0 g/dL   HCT 26.5 (L) 36 - 46 %    Comment: Performed at [facility] [address] [place], [place] [address]  Hemoglobin and hematocrit, blood     Status: Abnormal   Collection Time: 03/18/20  1:24 PM  Result Value Ref Range   Hemoglobin 8.6 (L) 12.0 - 15.0 g/dL   HCT 27.4 (L) 36 - 46 %    Comment: Performed at [facility] [address], [place], [place] [address]  Hemoglobin and hematocrit, blood     Status: Abnormal   Collection Time: 03/18/20  9:08 PM  Result Value Ref Range   Hemoglobin 8.9 (L) 12.0 - 15.0 g/dL   HCT 27.9 (L) 36 - 46 %    Comment: Performed at [facility] [address], [place], [place] [address]  CBC with Differential/Platelet     Status: Abnormal   Collection Time: 03/19/20  7:23 AM  Result Value Ref Range   WBC 4.3 4.0 - 10.5 K/uL   RBC 3.42 (L) 3.87 - 5.11 MIL/uL   Hemoglobin 8.7 (L) 12.0 - 15.0 g/dL   HCT 28.1 (L) 36 - 46 %   MCV 82.2 80.0 - 100.0 fL   MCH 25.4 (L) 26.0 - 34.0 pg   MCHC 31.0 30.0 - 36.0 g/dL   RDW 19.0 (H) 11.5 - 15.5 %   Platelets 189 150 - 400 K/uL   nRBC 0.0 0.0 - 0.2 %   Neutrophils Relative % 57 %   Neutro Abs 2.4 1.7 - 7.7 K/uL   Lymphocytes Relative 27 %   Lymphs Abs 1.2 0.7 - 4.0 K/uL   Monocytes Relative 11 %   Monocytes Absolute 0.5 0 - 1 K/uL   Eosinophils Relative 4 %   Eosinophils Absolute 0.2 0 - 0 K/uL   Basophils Relative 0 %   Basophils  Absolute 0.0 0 - 0 K/uL   Immature Granulocytes 1 %   Abs Immature Granulocytes 0.02 0.00 - 0.07 K/uL    Comment: Performed at [facility], [address] [place], [place] [address]  Comprehensive metabolic panel     Status: Abnormal   Collection Time: 03/19/20  7:23 AM  Result Value Ref Range   Sodium 143 135 - 145 mmol/L   Potassium 3.2 (L) 3.5 - 5.1 mmol/L   Chloride 112 (H) 98 - 111 mmol/L   CO2 23 22 - 32 mmol/L   Glucose, Bld 89 70 - 99 mg/dL    Comment: Glucose reference range applies only to samples taken after fasting for at least 8 hours.   BUN 5 (L) 6 - 20 mg/dL   Creatinine, Ser 0.82 0.44 - 1.00 mg/dL   Calcium 8.7 (L) 8.9 - 10.3 mg/dL   Total Protein 4.9 (L) 6.5 - 8.1 g/dL   Albumin 3.0 (L) 3.5 - 5.0 g/dL  AST 18 15 - 41 U/L   ALT 15 0 - 44 U/L   Alkaline Phosphatase 38 38 - 126 U/L   Total Bilirubin 0.9 0.3 - 1.2 mg/dL   GFR calc non Af Amer >60 >60 mL/min   GFR calc Af Amer >60 >60 mL/min   Anion gap 8 5 - 15    Comment: Performed at [facility] [address], [place], [place] [address]  Magnesium     Status: Abnormal   Collection Time: 03/19/20  7:23 AM  Result Value Ref Range   Magnesium 1.6 (L) 1.7 - 2.4 mg/dL    Comment: Performed at [facility] [address] [place], [place] [address]  Phosphorus     Status: None   Collection Time: 03/19/20  7:23 AM  Result Value Ref Range   Phosphorus 4.1 2.5 - 4.6 mg/dL    Comment: Performed at [facility] [address] [place], [place] [address]  Hemoglobin and hematocrit, blood     Status: Abnormal   Collection Time: 03/19/20  2:02 PM  Result Value Ref Range   Hemoglobin 9.0 (L) 12.0 - 15.0 g/dL   HCT 28.3 (L) 36 - 46 %    Comment: Performed at [facility] [address], [place], [place] [address]    No results found.  Review of Systems  Constitutional: Negative for chills and fever.  HENT: Negative for ear discharge, hearing loss and sore throat.   Eyes: Negative  for discharge.  Respiratory: Negative for cough and shortness of breath.   Cardiovascular: Negative for chest pain and leg swelling.  Gastrointestinal: Positive for blood in stool. Negative for abdominal pain, constipation, diarrhea, nausea and vomiting.  Musculoskeletal: Negative for myalgias and neck pain.  Skin: Negative for rash.  Allergic/Immunologic: Negative for environmental allergies.  Neurological: Negative for dizziness and seizures.  Hematological: Does not bruise/bleed easily.  Psychiatric/Behavioral: Negative for suicidal ideas.  All other systems reviewed and are negative.  Blood pressure (!) 151/65, pulse 62, temperature 98.7 F (37.1 C), temperature source Oral, resp. rate 11, height 5\' 2"  (1.575 m), weight 88.7 kg, SpO2 100 %. Physical Exam Constitutional:      Appearance: She is well-developed.     Comments: Conversant No acute distress  Eyes:     General: Lids are normal. No scleral icterus.    Comments: Pupils are equal round and reactive No lid lag Moist conjunctiva  Neck:     Thyroid: No thyromegaly.     Trachea: No tracheal tenderness.     Comments: No cervical lymphadenopathy Cardiovascular:     Rate and Rhythm: Normal rate and regular rhythm.     Heart sounds: No murmur heard.   Pulmonary:     Effort: Pulmonary effort is normal.     Breath sounds: Normal breath sounds. No wheezing or rales.  Abdominal:     Tenderness: There is no abdominal tenderness.     Hernia: No hernia is present.  Skin:    General: Skin is warm.     Findings: No rash.     Nails: There is no clubbing.     Comments: Normal skin turgor  Neurological:     Mental Status: She is alert and oriented to person, place, and time.     Comments: Normal gait and station  Psychiatric:        Judgment: Judgment normal.     Comments: Appropriate affect     Assessment/Plan: 59 year old  female status post gastric bypass, with likely marginal ulcer which is now been intervened by  GI.  1.  We will continue to monitor patient.  Ideally patient would undergo IR intervention if she does rebleed.  Ultimately if this is unsuccessful patient may need resection of anastomosis.  2.  We will follow along with you.  Ralene Ok 03/19/2020, 7:46 PM

## 2020-03-19 NOTE — Transfer of Care (Signed)
Immediate Anesthesia Transfer of Care Note  Patient: Kassady Laboy  Procedure(s) Performed: ESOPHAGOGASTRODUODENOSCOPY (EGD) WITH PROPOFOL (N/A ) SCLEROTHERAPY HEMOSTASIS CLIP PLACEMENT  Patient Location: Endoscopy Unit  Anesthesia Type:MAC  Level of Consciousness: awake, alert , oriented, patient cooperative and responds to stimulation  Airway & Oxygen Therapy: Patient Spontanous Breathing  Post-op Assessment: Report given to RN and Post -op Vital signs reviewed and stable  Post vital signs: Reviewed and stable  Last Vitals:  Vitals Value Taken Time  BP 140/75 03/19/20 1251  Temp    Pulse 80 03/19/20 1252  Resp 16 03/19/20 1252  SpO2 100 % 03/19/20 1252  Vitals shown include unvalidated device data.  Last Pain:  Vitals:   03/19/20 1136  TempSrc: Oral  PainSc: 0-No pain      Patients Stated Pain Goal: 3 (49/70/26 3785)  Complications: No complications documented.

## 2020-03-20 DIAGNOSIS — K254 Chronic or unspecified gastric ulcer with hemorrhage: Secondary | ICD-10-CM

## 2020-03-20 DIAGNOSIS — R001 Bradycardia, unspecified: Secondary | ICD-10-CM

## 2020-03-20 DIAGNOSIS — K2100 Gastro-esophageal reflux disease with esophagitis, without bleeding: Secondary | ICD-10-CM

## 2020-03-20 DIAGNOSIS — Z862 Personal history of diseases of the blood and blood-forming organs and certain disorders involving the immune mechanism: Secondary | ICD-10-CM

## 2020-03-20 DIAGNOSIS — M069 Rheumatoid arthritis, unspecified: Secondary | ICD-10-CM

## 2020-03-20 DIAGNOSIS — I10 Essential (primary) hypertension: Secondary | ICD-10-CM

## 2020-03-20 LAB — COMPREHENSIVE METABOLIC PANEL
ALT: 15 U/L (ref 0–44)
AST: 17 U/L (ref 15–41)
Albumin: 3 g/dL — ABNORMAL LOW (ref 3.5–5.0)
Alkaline Phosphatase: 38 U/L (ref 38–126)
Anion gap: 7 (ref 5–15)
BUN: 6 mg/dL (ref 6–20)
CO2: 24 mmol/L (ref 22–32)
Calcium: 8.7 mg/dL — ABNORMAL LOW (ref 8.9–10.3)
Chloride: 110 mmol/L (ref 98–111)
Creatinine, Ser: 0.87 mg/dL (ref 0.44–1.00)
GFR calc Af Amer: >60 mL/min (ref >60)
GFR calc non Af Amer: >60 mL/min (ref >60)
Glucose, Bld: 86 mg/dL (ref 70–99)
Potassium: 3.2 mmol/L — ABNORMAL LOW (ref 3.5–5.1)
Sodium: 141 mmol/L (ref 135–145)
Total Bilirubin: 0.5 mg/dL (ref 0.3–1.2)
Total Protein: 5 g/dL — ABNORMAL LOW (ref 6.5–8.1)

## 2020-03-20 LAB — HEMOGLOBIN AND HEMATOCRIT, BLOOD
HCT: 27.1 % — ABNORMAL LOW (ref 36.0–46.0)
HCT: 27.7 % — ABNORMAL LOW (ref 36.0–46.0)
Hemoglobin: 8.5 g/dL — ABNORMAL LOW (ref 12.0–15.0)
Hemoglobin: 8.8 g/dL — ABNORMAL LOW (ref 12.0–15.0)

## 2020-03-20 LAB — MAGNESIUM: Magnesium: 2 mg/dL (ref 1.7–2.4)

## 2020-03-20 LAB — CBC
HCT: 27.3 % — ABNORMAL LOW (ref 36.0–46.0)
Hemoglobin: 8.5 g/dL — ABNORMAL LOW (ref 12.0–15.0)
MCH: 25.9 pg — ABNORMAL LOW (ref 26.0–34.0)
MCHC: 31.1 g/dL (ref 30.0–36.0)
MCV: 83.2 fL (ref 80.0–100.0)
Platelets: 194 10*3/uL (ref 150–400)
RBC: 3.28 MIL/uL — ABNORMAL LOW (ref 3.87–5.11)
RDW: 18.8 % — ABNORMAL HIGH (ref 11.5–15.5)
WBC: 4.9 10*3/uL (ref 4.0–10.5)
nRBC: 0 % (ref 0.0–0.2)

## 2020-03-20 LAB — PHOSPHORUS: Phosphorus: 3.9 mg/dL (ref 2.5–4.6)

## 2020-03-20 MED ORDER — POTASSIUM CHLORIDE 10 MEQ/100ML IV SOLN
10.0000 meq | INTRAVENOUS | Status: DC
  Administered 2020-03-20: 10 meq via INTRAVENOUS
  Filled 2020-03-20: qty 100

## 2020-03-20 MED ORDER — POTASSIUM CHLORIDE 10 MEQ/100ML IV SOLN
10.0000 meq | INTRAVENOUS | Status: AC
  Administered 2020-03-20 (×3): 10 meq via INTRAVENOUS
  Filled 2020-03-20 (×3): qty 100

## 2020-03-20 MED ORDER — MAGNESIUM SULFATE 2 GM/50ML IV SOLN
2.0000 g | Freq: Once | INTRAVENOUS | Status: AC
  Administered 2020-03-20: 2 g via INTRAVENOUS
  Filled 2020-03-20: qty 50

## 2020-03-20 NOTE — Plan of Care (Signed)
  Problem: Clinical Measurements: Goal: Ability to maintain clinical measurements within normal limits will improve Outcome: Progressing   Problem: Activity: Goal: Risk for activity intolerance will decrease Outcome: Progressing   Problem: Coping: Goal: Level of anxiety will decrease Outcome: Progressing   

## 2020-03-20 NOTE — Progress Notes (Signed)
GASTROENTEROLOGY ROUNDING NOTE Subjective: Patient seems to be doing fairly well after her EGD with clipping of a visible vessel. She is tolerating her diet well and denies having any nausea, vomiting or abdominal pain. She has not had a BM over the last couple of days. She tolerated the iron infusion well yesterday.   Objective: Vital signs in last 24 hours: Temp:  [97.8 F (36.6 C)-98.3 F (36.8 C)] 98.2 F (36.8 C) (07/07 1505) Pulse Rate:  [55-62] 58 (07/07 1505) Resp:  [16] 16 (07/07 1505) BP: (108-124)/(63-74) 108/70 (07/07 1505) SpO2:  [99 %-100 %] 100 % (07/07 1505) Weight:  [88.5 kg] 88.5 kg (07/07 0500) Last BM Date: 03/18/20 General: NAD Lungs: CTA  Heart: S1 & S2 regular.  Abdomen: Soft NTNABS Ext: No edema, cyanosis or clubbing   Intake/Output from previous day: 07/06 0701 - 07/07 0700 In: 1142.7 [P.O.:440; I.V.:510; IV Piggyback:192.7] Out: 600 [Urine:600] Intake/Output this shift: Total I/O In: 804.5 [P.O.:800; IV Piggyback:4.5] Out: -   Lab Results: Recent Labs    03/18/20 0637 03/18/20 0958 03/19/20 0723 03/19/20 1402 03/19/20 2308 03/20/20 0651 03/20/20 1421  WBC 3.5*  --  4.3  --   --  4.9  --   HGB 8.0*   < > 8.7*   < > 7.6* 8.5* 8.5*  PLT 162  --  189  --   --  194  --   MCV 83.0  --  82.2  --   --  83.2  --    < > = values in this interval not displayed.   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 Patient DENIES the use of ANY NSAIDS. I feel  the Actemra she is on may be causing her GI hemorrhage. I will discuss this with her rheumatologist tomorrow. If she is stable over the next 24 hours she can be discharged with plans for close follow up with me.    2) Rheumatotid arthritis on Actemra-last dose was one day before her initial GI bleed started. 3) HTN. Juanita Craver, MD  03/20/2020, 4:19 PM

## 2020-03-20 NOTE — Progress Notes (Addendum)
PROGRESS NOTE    Mackenzie Rivera  EQA:[id number] DOB: 08/10/1961 DOA: 03/17/2020 PCP: Willey Blade, MD   Chief Complaint  Patient presents with  . Dizziness  . Melena    Brief Narrative:  Caiya Bettes is a 60 y.o. female with medical history significant of RA (on actemra), gastric bypass, GERD, hypertension, and GI bleed with recent admission requiring 4 units pRBC for anemia and EGD which showed an ulcer at the gastrojejunal anastamosis.  She was discharged on a PPI and sucralfate.  She's represented today with continued GI bleeding.  She notes BRBPR that she noted after getting home from being discharged.  Today she noticed melena x2 so she came to the hospital.  She notes mild lightheadedness.  She denies fevers, chills, chest pain, shortness of breath, nausea, vomiting, abdominal pain, edema.  She denies NSAIDs.  Denies smoking, drinking. In ED: GI consult, PPI gtt, 1 unit pRBC.  Admit to hospitalist for recurrent GI bleed.  Assessment & Plan:   Principal Problem:   GI bleed Active Problems:   Hypertension   Rheumatoid arthritis ([place])   Morbid obesity ([facility])   History of anemia   GERD (gastroesophageal reflux disease)   Anemia due to GI blood loss   Bradycardia  Acute Blood Loss Anemia  Recurrent GI Bleed  Melena  Iron Deficiency Anemia:  - Recently discharged 7/2, had endoscopy on that admission with ulceration at gastrojejunal anastomosis - BRBPR noted shortly after discharge with 2 episodes of melena noted 7/4 - No additional BM's yet, no new blood noted - Protonix bolus and gtt  -GI following, EGD shows 8 mm gastric ulcer with clot requiring 2 clips and epinephrine given 5 to 6 mm ulcer with actively bleeding vessel (clips are MRI unsafe**) with adequate hemostasis -Defer to GI for advancement of diet -Hemoglobin stable but minimally downtrending CBC Latest Ref Rng & Units 03/20/2020 03/20/2020 03/19/2020  WBC 4.0 - 10.5 K/uL - 4.9 -  Hemoglobin 12.0 - 15.0 g/dL 8.5(L) 8.5(L)  7.6(L)  Hematocrit 36 - 46 % 27.1(L) 27.3(L) 24.1(L)  Platelets 150 - 400 K/uL - 194 -  Iron studies suggestive of iron def -status post IV iron.    Bradycardia, asymptomatic - Previously noted Sinus Pause: 2.05 seconds - she was awake.  Asymptomatic.   - Not on any AV nodal blocking agents, will avoid these.   -Repeat EKG continues to show sinus bradycardia in the 50s  - Continue telemetry - Consider cardiology sideline should patient become symptomatic or have more prolonged and profound pauses  Elevated Bilirubin, resolved:     Component Value Date/Time   BILITOT 0.5 03/20/2020 0651   History of RA: actemra outpatient  History Gastric Bypass: continue supplements once taking PO  Hx of HTN: not on any meds, BP appropriate, follow   Hypokalemia  Hypomagnesemia:  -Status post 18 M EQ potassium yesterday, 2 g magnesium  -Remains low, replace 40 additional meq K and 2 g magnesium today follow with a.m. labs   DVT prophylaxis: SCD Code Status: full Family Communication: none at bedside Disposition:   Status is: Inpt  The patient will require care spanning > 2 midnights and should be moved to inpatient because: Inpatient level of care appropriate due to severity of illness  Dispo: The patient is from: Home              Anticipated d/c is to: Home              Anticipated d/c date is:  24-48h pending clinical course              Patient currently is not medically stable to d/c. Still requiring close monitoring, concern for ongoing bleeding.   Consultants:   GI, general surgery, interventional radiology   Procedures:  EGD Impression - Normal appearing, widely patent esophagus and GEJ; SCJ was measured at 40 cm. - Oozing 8 mm gastric ulcers with adherent clot; injected with epinephrine and 2 hemoclips (MR unsafe) were placed-hemostasis achieved. - Another 5-6 mm ulcer with an actively bleeding visible vessel at the margin of the anastamosis-2 MR unsafe hemoclips  placed-hemostasis achieved. - No specimens collected. Recommendation - NPO today; may allow ice chips this evening. - Continue present medications-IV PPI's for 72 hours. - Serial CBC's. - No Aspirin, Ibuprofen, Naproxen, or other non-steroidal anti-inflammatory drugs.  Antimicrobials:  Anti-infectives (From admission, onward)   None      Subjective: No acute issues or events overnight, denies nausea, vomiting, diarrhea, constipation, headache, fevers, chills.  Patient still has some fatigue ongoing but states she feels markedly improved from admission.  Objective: Vitals:   03/20/20 0206 03/20/20 0409 03/20/20 0500 03/20/20 1505  BP: 108/63 124/74  108/70  Pulse: 60 (!) 55  (!) 58  Resp: 16 16  16   Temp: 98.3 F (36.8 C) 97.8 F (36.6 C)  98.2 F (36.8 C)  TempSrc: Oral Oral  Oral  SpO2: 99% 100%  100%  Weight:   88.5 kg   Height:        Intake/Output Summary (Last 24 hours) at 03/20/2020 1506 Last data filed at 03/20/2020 1253 Gross per 24 hour  Intake 1442.73 ml  Output 600 ml  Net 842.73 ml   Filed Weights   03/18/20 0500 03/19/20 0500 03/20/20 0500  Weight: 88.9 kg 88.7 kg 88.5 kg    Examination:  General:  Pleasantly resting in bed, No acute distress. HEENT:  Normocephalic atraumatic.  Sclerae nonicteric, noninjected.  Extraocular movements intact bilaterally. Neck:  Without mass or deformity.  Trachea is midline. Lungs:  Clear to auscultate bilaterally without rhonchi, wheeze, or rales. Heart:  Regular rate and rhythm.  Without murmurs, rubs, or gallops. Abdomen:  Soft, nontender, nondistended.  Without guarding or rebound. Extremities: Without cyanosis, clubbing, edema, or obvious deformity. Vascular:  Dorsalis pedis and posterior tibial pulses palpable bilaterally. Skin:  Warm and dry, no erythema, no ulcerations.  Data Reviewed: I have personally reviewed following labs and imaging studies  CBC: Recent Labs  Lab 03/15/20 0505 03/15/20 0505 03/17/20  1608 03/17/20 2318 03/18/20 9381 03/18/20 0175 03/19/20 0723 03/19/20 1402 03/19/20 2308 03/20/20 0651 03/20/20 1421  WBC 4.4  --  4.8  --  3.5*  --  4.3  --   --  4.9  --   NEUTROABS  --   --   --   --   --   --  2.4  --   --   --   --   HGB 7.9*   < > 6.4*   < > 8.0*   < > 8.7* 9.0* 7.6* 8.5* 8.5*  HCT 24.8*   < > 20.8*   < > 25.3*   < > 28.1* 28.3* 24.1* 27.3* 27.1*  MCV 83.5  --  84.2  --  83.0  --  82.2  --   --  83.2  --   PLT 187  --  206  --  162  --  189  --   --  194  --    < > =  values in this interval not displayed.    Basic Metabolic Panel: Recent Labs  Lab 03/15/20 0505 03/17/20 1608 03/18/20 0637 03/19/20 0723 03/20/20 0651  NA 139 139 142 143 141  K 3.3* 3.6 3.5 3.2* 3.2*  CL 112* 113* 114* 112* 110  CO2 21* 20* 21* 23 24  GLUCOSE 91 90 92 89 86  BUN 8 10 9  5* 6  CREATININE 0.96 0.88 0.91 0.82 0.87  CALCIUM 8.7* 8.5* 8.5* 8.7* 8.7*  MG  --   --   --  1.6* 2.0  PHOS  --   --   --  4.1 3.9    GFR: Estimated Creatinine Clearance: 73.8 mL/min (by C-G formula based on SCr of 0.87 mg/dL).  Liver Function Tests: Recent Labs  Lab 03/15/20 0505 03/17/20 1608 03/18/20 0637 03/19/20 0723 03/20/20 0651  AST 21 18 20 18 17   ALT 14 13 14 15 15   ALKPHOS 41 43 36* 38 38  BILITOT 0.8 0.7 1.8* 0.9 0.5  PROT 5.1* 5.3* 4.6* 4.9* 5.0*  ALBUMIN 3.1* 3.3* 2.7* 3.0* 3.0*    CBG: No results for input(s): GLUCAP in the last 168 hours.   Recent Results (from the past 240 hour(s))  SARS Coronavirus 2 by RT PCR (hospital order, performed in [facility] hospital lab) Nasopharyngeal Nasopharyngeal Swab     Status: None   Collection Time: 03/13/20 12:53 PM   Specimen: Nasopharyngeal Swab  Result Value Ref Range Status   SARS Coronavirus 2 NEGATIVE NEGATIVE Final    Comment: (NOTE) SARS-CoV-2 target nucleic acids are NOT DETECTED.  The SARS-CoV-2 RNA is generally detectable in upper and lower respiratory specimens during the acute phase of infection. The lowest  concentration of SARS-CoV-2 viral copies this assay can detect is 250 copies / mL. A negative result does not preclude SARS-CoV-2 infection and should not be used as the sole basis for treatment or other patient management decisions.  A negative result may occur with improper specimen collection / handling, submission of specimen other than nasopharyngeal swab, presence of viral mutation(s) within the areas targeted by this assay, and inadequate number of viral copies (<250 copies / mL). A negative result must be combined with clinical observations, patient history, and epidemiological information.  Fact Sheet for Patients:   StrictlyIdeas.no  Fact Sheet for Healthcare Providers: BankingDealers.co.za  This test is not yet approved or  cleared by the [place] FDA and has been authorized for detection and/or diagnosis of SARS-CoV-2 by FDA under an Emergency Use Authorization (EUA).  This EUA will remain in effect (meaning this test can be used) for the duration of the COVID-19 declaration under Section 564(b)(1) of the Act, 21 U.S.C. section 360bbb-3(b)(1), unless the authorization is terminated or revoked sooner.  Performed at [facility], [place] [address], [place], [place] [address]   SARS Coronavirus 2 by RT PCR (hospital order, performed in [facility] hospital lab) Nasopharyngeal Nasopharyngeal Swab     Status: None   Collection Time: 03/17/20  7:18 PM   Specimen: Nasopharyngeal Swab  Result Value Ref Range Status   SARS Coronavirus 2 NEGATIVE NEGATIVE Final    Comment: (NOTE) SARS-CoV-2 target nucleic acids are NOT DETECTED.  The SARS-CoV-2 RNA is generally detectable in upper and lower respiratory specimens during the acute phase of infection. The lowest concentration of SARS-CoV-2 viral copies this assay can detect is 250 copies / mL. A negative result does not preclude SARS-CoV-2 infection and should not be used as  the  sole basis for treatment or other patient management decisions.  A negative result may occur with improper specimen collection / handling, submission of specimen other than nasopharyngeal swab, presence of viral mutation(s) within the areas targeted by this assay, and inadequate number of viral copies (<250 copies / mL). A negative result must be combined with clinical observations, patient history, and epidemiological information.  Fact Sheet for Patients:   StrictlyIdeas.no  Fact Sheet for Healthcare Providers: BankingDealers.co.za  This test is not yet approved or  cleared by the [place] FDA and has been authorized for detection and/or diagnosis of SARS-CoV-2 by FDA under an Emergency Use Authorization (EUA).  This EUA will remain in effect (meaning this test can be used) for the duration of the COVID-19 declaration under Section 564(b)(1) of the Act, 21 U.S.C. section 360bbb-3(b)(1), unless the authorization is terminated or revoked sooner.  Performed at [facility], [place] [address] [place], [place] [address]          Radiology Studies: No results found.      Scheduled Meds: . pantoprazole (PROTONIX) IV  40 mg Intravenous Q12H   Continuous Infusions:    LOS: 2 days   Time spent: over [place], DO [facility]  Pager: please use epic messenger 03/20/2020, 3:06 PM

## 2020-03-20 NOTE — Progress Notes (Signed)
[facility] Progress Note  1 Day Post-Op  Subjective: CC:  NAEO. Denies abdominal pain, nausea, or emesis. Has not had a BM since her endoscopy procedure. Tolerating CLD. Received IV iron yesterday 7/6.  Objective: Vital signs in last 24 hours: Temp:  [97.8 F (36.6 C)-98.9 F (37.2 C)] 97.8 F (36.6 C) (07/07 0409) Pulse Rate:  [55-81] 55 (07/07 0409) Resp:  [11-20] 16 (07/07 0409) BP: (108-151)/(62-75) 124/74 (07/07 0409) SpO2:  [99 %-100 %] 100 % (07/07 0409) Weight:  [88.5 kg] 88.5 kg (07/07 0500) Last BM Date: 03/18/20  Intake/Output from previous day: 07/06 0701 - 07/07 0700 In: 1142.7 [P.O.:440; I.V.:510; IV Piggyback:192.7] Out: 600 [Urine:600] Intake/Output this shift: Total I/O In: 540 [P.O.:540] Out: -   PE: Gen:  Alert, NAD, pleasant Card:  Regular rate and rhythm, pedal pulses 2+ BL Pulm:  Normal effort, clear to auscultation bilaterally Abd: Soft, non-tender, non-distended, bowel sounds present  Skin: warm and dry, no rashes  Psych: A&Ox3   Lab Results:  Recent Labs    03/19/20 0723 03/19/20 1402 03/19/20 2308 03/20/20 0651  WBC 4.3  --   --  4.9  HGB 8.7*   < > 7.6* 8.5*  HCT 28.1*   < > 24.1* 27.3*  PLT 189  --   --  194   < > = values in this interval not displayed.   BMET Recent Labs    03/19/20 0723 03/20/20 0651  NA 143 141  K 3.2* 3.2*  CL 112* 110  CO2 23 24  GLUCOSE 89 86  BUN 5* 6  CREATININE 0.82 0.87  CALCIUM 8.7* 8.7*   PT/INR No results for input(s): LABPROT, INR in the last 72 hours. CMP     Component Value Date/Time   NA 141 03/20/2020 0651   K 3.2 (L) 03/20/2020 0651   CL 110 03/20/2020 0651   CO2 24 03/20/2020 0651   GLUCOSE 86 03/20/2020 0651   BUN 6 03/20/2020 0651   CREATININE 0.87 03/20/2020 0651   CALCIUM 8.7 (L) 03/20/2020 0651   PROT 5.0 (L) 03/20/2020 0651   ALBUMIN 3.0 (L) 03/20/2020 0651   AST 17 03/20/2020 0651   ALT 15 03/20/2020 0651   ALKPHOS 38 03/20/2020 0651   BILITOT 0.5  03/20/2020 0651   GFRNONAA >60 03/20/2020 0651   GFRAA >60 03/20/2020 0651   Lipase     Component Value Date/Time   LIPASE 28.0 02/13/2011 1211   Studies/Results: No results found.  Anti-infectives: Anti-infectives (From admission, onward)   None     Assessment/Plan RA GERD HTN  PMH Roux-en-Y gastric bypass 2004 UGI bleed due to gastric and marginal ulcers S/p upper endoscopy 7/6 Dr. Collene Mares - Oozing 8 mm gastric ulcers with adherent clo injected with epi and 2 hemoclips; 5-6 mm ulcer with an actively bleeding visible vessel at the margin of the anastamosis- hemoclips placed.  - afebrile, VSS - hgb/hct 8.5/27 gtom 7.6/24 yesterday  - continue IV PPI - No acute surgical needs. for recurrent bleeding wound consult IR for angioembolization. [facility] will follow with you.  FEN: CLD VTE: SCDs ID: none Foley: none    LOS: 2 days    Obie Dredge, [facility] Please see Amion for pager number during day hours 7:00am-4:30pm

## 2020-03-21 ENCOUNTER — Encounter (HOSPITAL_COMMUNITY): Payer: Self-pay | Admitting: Gastroenterology

## 2020-03-21 LAB — CBC
HCT: 26.1 % — ABNORMAL LOW (ref 36.0–46.0)
Hemoglobin: 8.2 g/dL — ABNORMAL LOW (ref 12.0–15.0)
MCH: 26.4 pg (ref 26.0–34.0)
MCHC: 31.4 g/dL (ref 30.0–36.0)
MCV: 83.9 fL (ref 80.0–100.0)
Platelets: 205 10*3/uL (ref 150–400)
RBC: 3.11 MIL/uL — ABNORMAL LOW (ref 3.87–5.11)
RDW: 19.1 % — ABNORMAL HIGH (ref 11.5–15.5)
WBC: 3.7 10*3/uL — ABNORMAL LOW (ref 4.0–10.5)
nRBC: 0 % (ref 0.0–0.2)

## 2020-03-21 LAB — BASIC METABOLIC PANEL
Anion gap: 4 — ABNORMAL LOW (ref 5–15)
BUN: 5 mg/dL — ABNORMAL LOW (ref 6–20)
CO2: 23 mmol/L (ref 22–32)
Calcium: 8.6 mg/dL — ABNORMAL LOW (ref 8.9–10.3)
Chloride: 114 mmol/L — ABNORMAL HIGH (ref 98–111)
Creatinine, Ser: 0.88 mg/dL (ref 0.44–1.00)
GFR calc Af Amer: >60 mL/min (ref >60)
GFR calc non Af Amer: >60 mL/min (ref >60)
Glucose, Bld: 82 mg/dL (ref 70–99)
Potassium: 3.6 mmol/L (ref 3.5–5.1)
Sodium: 141 mmol/L (ref 135–145)

## 2020-03-21 NOTE — Progress Notes (Signed)
GASTROENTEROLOGY ROUNDING NOTE Subjective: Patient seems to be doing well today. She is tolerating her diet well. There have been no further signs of GI bleeding. She was in the shower when I went to see her but as per my discussion with her nurse, she is stable from a GI standpoint. I called her rheumatologist's office today to inform Dr. Ethelle Lyon about her GI bleed that has been possibly cased by Actemra. As he was out of the office I spoke to Dr. Berna Bue, his partner and she has told me that they will make a note in her charet there to stop the Actemra treatments right away.  Objective: Vital signs in last 24 hours: Temp:  [97.6 F (36.4 C)-98.6 F (37 C)] 97.6 F (36.4 C) (07/08 0444) Pulse Rate:  [56-65] 56 (07/08 0444) Resp:  [16-18] 16 (07/08 0444) BP: (108-116)/(70-76) 116/76 (07/08 0444) SpO2:  [100 %] 100 % (07/08 0444) Weight:  [88.1 kg] 88.1 kg (07/08 0444) Last BM Date: 03/18/20 GPE not done  Intake/Output from previous day: 07/07 0701 - 07/08 0700 In: 1528.6 [P.O.:1380; IV Piggyback:148.6] Out: -  Intake/Output this shift: Total I/O In: 580 [P.O.:580] Out: -   Lab Results: Recent Labs    03/19/20 0723 03/19/20 1402 03/20/20 0651 03/20/20 0651 03/20/20 1421 03/20/20 2132 03/21/20 0738  WBC 4.3  --  4.9  --   --   --  3.7*  HGB 8.7*   < > 8.5*   < > 8.5* 8.8* 8.2*  PLT 189  --  194  --   --   --  205  MCV 82.2  --  83.2  --   --   --  83.9   < > = values in this interval not displayed.   BMET Recent Labs    03/19/20 0723 03/20/20 0651 03/21/20 0738  NA 143 141 141  K 3.2* 3.2* 3.6  CL 112* 110 114*  CO2 23 24 23   GLUCOSE 89 86 82  BUN 5* 6 5*  CREATININE 0.82 0.87 0.88  CALCIUM 8.7* 8.7* 8.6*   LFT Recent Labs    03/19/20 0723 03/20/20 0651  PROT 4.9* 5.0*  ALBUMIN 3.0* 3.0*  AST 18 17  ALT 15 15  ALKPHOS 38 38  BILITOT 0.9 0.5   PT/INR No results for input(s): INR in the last 72 hours. Imaging/Other results: No results  found.  Assessment/Plan 1) Posthemorrhagic anemia s/p UGI bleed from an anastamotic ulcer [she is s/p Rou-en-y gastric bypass] on Actemra for rheumatoid arthritis. This treatment will be discontinued from here on. She will see her rheumatologist after she is discharged to discuss further treatment options. 2) GERD on PPI's. We can switch to oral PPI's tomorrow. 3) HTN. 4) Rheumatoid arthritis. 5) Hypokalemia/hypomagnesemia. Juanita Craver, MD  03/21/2020, 12:57 PM

## 2020-03-21 NOTE — Progress Notes (Signed)
PROGRESS NOTE    Mackenzie Rivera  CZY:[id number] DOB: 1961-05-20 DOA: 03/17/2020 PCP: Willey Blade, MD    Brief Narrative:  59 year old female with history of rheumatoid arthritis on Actemra, gastric bypass surgery, GERD, hypertension and recent GI bleeding requiring 4 units of PRBC and EGD that showed ulcer at the gastrojejunal anastomosis was discharged on PPI and sucralfate.  She came back to the hospital with continued GI bleeding, bright red blood per rectum and melanotic stool. In the emergency room, hemoglobin 8.7-dropped to 7.6, 1 unit PRBC given and admitted with PPI drip.   Assessment & Plan:   Principal Problem:   GI bleed Active Problems:   Hypertension   Rheumatoid arthritis ([place])   Morbid obesity (H[facility])   History of anemia   GERD (gastroesophageal reflux disease)   Anemia due to GI blood loss   Bradycardia  Acute blood loss anemia due to acute upper GI bleeding, recurrent due to anastomotic ulcer: EGD with 8 mm gastric ulcer with clot status post clipping of the bleeding vessel PPI IV for for 72 hours On clears, clinically improving, followed by GI and surgery. Hemoglobin has since remained stable. She also received 1 unit of iron transfusion. Probably aggravated by Actemra, on hold. We will continue to monitor.  Stabilizing today.  Asymptomatic bradycardia: Asymptomatic.  History of rheumatoid arthritis: On Actemra as outpatient.  GI notified her rheumatologist about alternate medications.  Hypokalemia/hypomagnesemia: Replaced.  Adequate on repeat check.   DVT prophylaxis: SCDs Start: 03/17/20 2131   Code Status: Full code Family Communication: None Disposition Plan: Status is: Inpatient  Remains inpatient appropriate because:IV treatments appropriate due to intensity of illness or inability to take PO   Dispo:  Patient From: Home  Planned Disposition: Home  Expected discharge date: 03/21/20  Medically stable for discharge: No           Consultants:   GI  Surgery  Procedures:   Upper GI endoscopy,   03/14/2020 , anastomotic ulcer  03/19/2020, 2 actively bleeding anastomotic ulcers  Antimicrobials:   None   Subjective: Patient seen and examined.  She is asymptomatic today.  Denies any nausea vomiting.  No bowel movement yet, however she has not eaten any solid food since being in the hospital.  Denies any abdominal pain.  Walking around.  Objective: Vitals:   03/20/20 0500 03/20/20 1505 03/20/20 2035 03/21/20 0444  BP:  108/70 109/70 116/76  Pulse:  (!) 58 65 (!) 56  Resp:  16 18 16   Temp:  98.2 F (36.8 C) 98.6 F (37 C) 97.6 F (36.4 C)  TempSrc:  Oral Oral Oral  SpO2:  100% 100% 100%  Weight: 88.5 kg   88.1 kg  Height:        Intake/Output Summary (Last 24 hours) at 03/21/2020 0745 Last data filed at 03/21/2020 0444 Gross per 24 hour  Intake 1528.55 ml  Output --  Net 1528.55 ml   Filed Weights   03/19/20 0500 03/20/20 0500 03/21/20 0444  Weight: 88.7 kg 88.5 kg 88.1 kg    Examination:  General exam: Appears calm and comfortable  Respiratory system: Clear to auscultation. Respiratory effort normal. Cardiovascular system: S1 & S2 heard, RRR. No JVD, murmurs, rubs, gallops or clicks. No pedal edema. Gastrointestinal system: Abdomen is nondistended, soft and nontender. No organomegaly or masses felt. Normal bowel sounds heard. Central nervous system: Alert and oriented. No focal neurological deficits. Extremities: Symmetric 5 x 5 power. Skin: No rashes, lesions or ulcers Psychiatry: Judgement and insight  appear normal. Mood & affect appropriate.     Data Reviewed: I have personally reviewed following labs and imaging studies  CBC: Recent Labs  Lab 03/15/20 0505 03/15/20 0505 03/17/20 1608 03/17/20 2318 03/18/20 7616 03/18/20 0737 03/19/20 0723 03/19/20 0723 03/19/20 1402 03/19/20 2308 03/20/20 0651 03/20/20 1421 03/20/20 2132  WBC 4.4  --  4.8  --  3.5*  --  4.3  --   --    --  4.9  --   --   NEUTROABS  --   --   --   --   --   --  2.4  --   --   --   --   --   --   HGB 7.9*   < > 6.4*   < > 8.0*   < > 8.7*   < > 9.0* 7.6* 8.5* 8.5* 8.8*  HCT 24.8*   < > 20.8*   < > 25.3*   < > 28.1*   < > 28.3* 24.1* 27.3* 27.1* 27.7*  MCV 83.5  --  84.2  --  83.0  --  82.2  --   --   --  83.2  --   --   PLT 187  --  206  --  162  --  189  --   --   --  194  --   --    < > = values in this interval not displayed.   Basic Metabolic Panel: Recent Labs  Lab 03/15/20 0505 03/17/20 1608 03/18/20 0637 03/19/20 0723 03/20/20 0651  NA 139 139 142 143 141  K 3.3* 3.6 3.5 3.2* 3.2*  CL 112* 113* 114* 112* 110  CO2 21* 20* 21* 23 24  GLUCOSE 91 90 92 89 86  BUN 8 10 9  5* 6  CREATININE 0.96 0.88 0.91 0.82 0.87  CALCIUM 8.7* 8.5* 8.5* 8.7* 8.7*  MG  --   --   --  1.6* 2.0  PHOS  --   --   --  4.1 3.9   GFR: Estimated Creatinine Clearance: 73.5 mL/min (by C-G formula based on SCr of 0.87 mg/dL). Liver Function Tests: Recent Labs  Lab 03/15/20 0505 03/17/20 1608 03/18/20 0637 03/19/20 0723 03/20/20 0651  AST 21 18 20 18 17   ALT 14 13 14 15 15   ALKPHOS 41 43 36* 38 38  BILITOT 0.8 0.7 1.8* 0.9 0.5  PROT 5.1* 5.3* 4.6* 4.9* 5.0*  ALBUMIN 3.1* 3.3* 2.7* 3.0* 3.0*   No results for input(s): LIPASE, AMYLASE in the last 168 hours. No results for input(s): AMMONIA in the last 168 hours. Coagulation Profile: No results for input(s): INR, PROTIME in the last 168 hours. Cardiac Enzymes: No results for input(s): CKTOTAL, CKMB, CKMBINDEX, TROPONINI in the last 168 hours. BNP (last 3 results) No results for input(s): PROBNP in the last 8760 hours. HbA1C: No results for input(s): HGBA1C in the last 72 hours. CBG: No results for input(s): GLUCAP in the last 168 hours. Lipid Profile: No results for input(s): CHOL, HDL, LDLCALC, TRIG, CHOLHDL, LDLDIRECT in the last 72 hours. Thyroid Function Tests: No results for input(s): TSH, T4TOTAL, FREET4, T3FREE, THYROIDAB in the last  72 hours. Anemia Panel: No results for input(s): VITAMINB12, FOLATE, FERRITIN, TIBC, IRON, RETICCTPCT in the last 72 hours. Sepsis Labs: No results for input(s): PROCALCITON, LATICACIDVEN in the last 168 hours.  Recent Results (from the past 240 hour(s))  SARS Coronavirus 2 by RT PCR (hospital order, performed in [facility] hospital lab) Nasopharyngeal Nasopharyngeal Swab     Status: None   Collection Time: 03/13/20 12:53 PM   Specimen: Nasopharyngeal Swab  Result Value Ref Range Status   SARS Coronavirus 2 NEGATIVE NEGATIVE Final    Comment: (NOTE) SARS-CoV-2 target nucleic acids are NOT DETECTED.  The SARS-CoV-2 RNA is generally detectable in upper and lower respiratory specimens during the acute phase of infection. The lowest concentration of SARS-CoV-2 viral copies this assay can detect is 250 copies / mL. A negative result does not preclude SARS-CoV-2 infection and should not be used as the sole basis for treatment or other patient management decisions.  A negative result may occur with improper specimen collection / handling, submission of specimen other than nasopharyngeal swab, presence of viral mutation(s) within the areas targeted by this assay, and inadequate number of viral copies (<250 copies / mL). A negative result must be combined with clinical observations, patient history, and epidemiological information.  Fact Sheet for Patients:   StrictlyIdeas.no  Fact Sheet for Healthcare Providers: BankingDealers.co.za  This test is not yet approved or  cleared by the [place] FDA and has been authorized for detection and/or diagnosis of SARS-CoV-2 by FDA under an Emergency Use Authorization (EUA).  This EUA will remain in effect (meaning this test can be used) for the duration of the COVID-19 declaration under Section 564(b)(1) of the Act, 21 U.S.C. section 360bbb-3(b)(1), unless the authorization is terminated  or revoked sooner.  Performed at [facility], [place] [address] [place], [place] [address]   SARS Coronavirus 2 by RT PCR (hospital order, performed in [facility] hospital lab) Nasopharyngeal Nasopharyngeal Swab     Status: None   Collection Time: 03/17/20  7:18 PM   Specimen: Nasopharyngeal Swab  Result Value Ref Range Status   SARS Coronavirus 2 NEGATIVE NEGATIVE Final    Comment: (NOTE) SARS-CoV-2 target nucleic acids are NOT DETECTED.  The SARS-CoV-2 RNA is generally detectable in upper and lower respiratory specimens during the acute phase of infection. The lowest concentration of SARS-CoV-2 viral copies this assay can detect is 250 copies / mL. A negative result does not preclude SARS-CoV-2 infection and should not be used as the sole basis for treatment or other patient management decisions.  A negative result may occur with improper specimen collection / handling, submission of specimen other than nasopharyngeal swab, presence of viral mutation(s) within the areas targeted by this assay, and inadequate number of viral copies (<250 copies / mL). A negative result must be combined with clinical observations, patient history, and epidemiological information.  Fact Sheet for Patients:   StrictlyIdeas.no  Fact Sheet for Healthcare Providers: BankingDealers.co.za  This test is not yet approved or  cleared by the [place] FDA and has been authorized for detection and/or diagnosis of SARS-CoV-2 by FDA under an Emergency Use Authorization (EUA).  This EUA will remain in effect (meaning this test can be used) for the duration of the COVID-19 declaration under Section 564(b)(1) of the Act, 21 U.S.C. section 360bbb-3(b)(1), unless the authorization is terminated or revoked sooner.  Performed at [facility], [place] [address] [place], [place] [address]          Radiology Studies: No results  found.      Scheduled Meds: . pantoprazole (PROTONIX) IV  40 mg Intravenous Q12H   Continuous Infusions:   LOS: 3 days    Time spent: 25 minutes    Barb Merino, MD [facility] Pager [phone]

## 2020-03-22 LAB — BASIC METABOLIC PANEL
Anion gap: 5 (ref 5–15)
BUN: 5 mg/dL — ABNORMAL LOW (ref 6–20)
CO2: 23 mmol/L (ref 22–32)
Calcium: 8.6 mg/dL — ABNORMAL LOW (ref 8.9–10.3)
Chloride: 112 mmol/L — ABNORMAL HIGH (ref 98–111)
Creatinine, Ser: 0.96 mg/dL (ref 0.44–1.00)
GFR calc Af Amer: >60 mL/min (ref >60)
GFR calc non Af Amer: >60 mL/min (ref >60)
Glucose, Bld: 87 mg/dL (ref 70–99)
Potassium: 3.7 mmol/L (ref 3.5–5.1)
Sodium: 140 mmol/L (ref 135–145)

## 2020-03-22 LAB — CBC
HCT: 25.1 % — ABNORMAL LOW (ref 36.0–46.0)
Hemoglobin: 7.7 g/dL — ABNORMAL LOW (ref 12.0–15.0)
MCH: 25.9 pg — ABNORMAL LOW (ref 26.0–34.0)
MCHC: 30.7 g/dL (ref 30.0–36.0)
MCV: 84.5 fL (ref 80.0–100.0)
Platelets: 188 10*3/uL (ref 150–400)
RBC: 2.97 MIL/uL — ABNORMAL LOW (ref 3.87–5.11)
RDW: 19.4 % — ABNORMAL HIGH (ref 11.5–15.5)
WBC: 4 10*3/uL (ref 4.0–10.5)
nRBC: 0 % (ref 0.0–0.2)

## 2020-03-22 LAB — METHYLMALONIC ACID, SERUM: Methylmalonic Acid, Quantitative: 159 nmol/L (ref 0–378)

## 2020-03-22 MED ORDER — DEXILANT 60 MG PO CPDR: 60.0000 mg | DELAYED_RELEASE_CAPSULE | Freq: Every day | ORAL | 0 refills | Status: DC

## 2020-03-22 NOTE — Discharge Summary (Signed)
Physician Discharge Summary  Mackenzie Rivera KGM:[id number] DOB: 10-Sep-1961 DOA: 03/17/2020  PCP: Willey Blade, MD  Admit date: 03/17/2020 Discharge date: 03/22/2020  Admitted From: Home Disposition: Home  Recommendations for Outpatient Follow-up:  1. Follow up with PCP in 1-2 weeks 2. Please obtain CBC in one week 3. Follow-up with your rheumatologist and gastroenterologist as a scheduled.  Discharge Condition: Stable CODE STATUS: Full code Diet recommendation: Low-salt diet  Discharge summary: 59 year old female with history of rheumatoid arthritis on Actemra, gastric bypass surgery, GERD and hypertension, recent GI bleeding requiring 4 units of PRBC and EGD that showed ulcer at gastrojejunal anastomosis was discharged home only to come back with continued GI bleeding, bright red blood per rectum and melanotic stools.  In the emergency room, hemoglobin was 6.1, she was started on blood transfusion and admitted to the hospital.  Acute blood loss anemia due to acute upper GI bleeding, recurrent due to anastomotic ulcer: EGD with 8 mm gastric ulcer with clot status post clipping of the bleeding vessel Treated with IV PPI in the hospital. Hemoglobin appropriately responded.  Received 2 units of PRBC and 1 unit of iron transfusion. Currently symptomatically improved. Anastomotic ulcer bleeding thought to be aggravated by use of Actemra, her rheumatologist was notified.  They will work on alternate medications.  She will also avoid any NSAIDs. Going home with Dexilant 60 mg daily, sucralfate daily. She has a symptomatic sinus bradycardia, not on any rate control medication. Her electrolytes were low including magnesium and potassium that were replaced and adequately improved now.  Patient is adequately improved today.  She is going home.  She will follow up with a gastroenterologist.  She will also need recheck hemoglobin in 1 week to ensure stabilization.     Discharge Diagnoses:   Principal Problem:   GI bleed Active Problems:   Hypertension   Rheumatoid arthritis ([place])   Morbid obesity ([facility])   History of anemia   GERD (gastroesophageal reflux disease)   Anemia due to GI blood loss   Bradycardia    Discharge Instructions  Discharge Instructions    Call MD for:   Complete by: As directed    Blood in the stool or black dark color stool.   Call MD for:  difficulty breathing, headache or visual disturbances   Complete by: As directed    Call MD for:  persistant dizziness or light-headedness   Complete by: As directed    Diet - low sodium heart healthy   Complete by: As directed    Discharge instructions   Complete by: As directed    Take your acid medicine Dexilant 60 mg daily, no changes in doses. Call and schedule appointment with Dr. Collene Mares your gastroenterologist. Do not take your rheumatoid medicine until further follow-up.   Increase activity slowly   Complete by: As directed      Allergies as of 03/22/2020   No Known Allergies     Medication List    STOP taking these medications   ACTEMRA IV     TAKE these medications   calcium citrate 950 (200 Ca) MG tablet Commonly known as: CALCITRATE - dosed in mg elemental calcium Take 1 tablet by mouth daily.   Dexilant 60 MG capsule Generic drug: dexlansoprazole Take 1 capsule (60 mg total) by mouth daily. What changed: how much to take   Ergocalciferol 50 MCG (2000 UT) Tabs Take 2,000 Units by mouth daily.   Iron 325 (65 Fe) MG Tabs Take 325 mg by mouth daily.  sucralfate 1 g tablet Commonly known as: CARAFATE Take 1 g by mouth 4 (four) times daily -  with meals and at bedtime.   vitamin B-12 100 MCG tablet Commonly known as: CYANOCOBALAMIN Take 50 mcg by mouth daily.       No Known Allergies  Consultations:  Gastroenterology  Surgery   Procedures/Studies:  No results found. (Echo, Carotid, EGD, Colonoscopy, ERCP)    Subjective: Patient seen and examined.  She is  eating regular diet.  Had normal bowel movement with no melanotic stool or blood on it overnight.  Denies any nausea vomiting.   Discharge Exam: Vitals:   03/21/20 2020 03/22/20 0501  BP: 108/68 128/73  Pulse: 72 (!) 57  Resp: 18 16  Temp: 98.1 F (36.7 C) 97.6 F (36.4 C)  SpO2: 100% 99%   Vitals:   03/21/20 0444 03/21/20 1518 03/21/20 2020 03/22/20 0501  BP: 116/76 110/66 108/68 128/73  Pulse: (!) 56 60 72 (!) 57  Resp: 16 18 18 16   Temp: 97.6 F (36.4 C) (!) 97.5 F (36.4 C) 98.1 F (36.7 C) 97.6 F (36.4 C)  TempSrc: Oral Oral Oral   SpO2: 100% 100% 100% 99%  Weight: 88.1 kg     Height:        General: Pt is alert, awake, not in acute distress Cardiovascular: RRR, S1/S2 +, no rubs, no gallops Respiratory: CTA bilaterally, no wheezing, no rhonchi Abdominal: Soft, NT, ND, bowel sounds + Extremities: no edema, no cyanosis    The results of significant diagnostics from this hospitalization (including imaging, microbiology, ancillary and laboratory) are listed below for reference.     Microbiology: Recent Results (from the past 240 hour(s))  SARS Coronavirus 2 by RT PCR (hospital order, performed in [facility] hospital lab) Nasopharyngeal Nasopharyngeal Swab     Status: None   Collection Time: 03/13/20 12:53 PM   Specimen: Nasopharyngeal Swab  Result Value Ref Range Status   SARS Coronavirus 2 NEGATIVE NEGATIVE Final    Comment: (NOTE) SARS-CoV-2 target nucleic acids are NOT DETECTED.  The SARS-CoV-2 RNA is generally detectable in upper and lower respiratory specimens during the acute phase of infection. The lowest concentration of SARS-CoV-2 viral copies this assay can detect is 250 copies / mL. A negative result does not preclude SARS-CoV-2 infection and should not be used as the sole basis for treatment or other patient management decisions.  A negative result may occur with improper specimen collection / handling, submission of specimen other than  nasopharyngeal swab, presence of viral mutation(s) within the areas targeted by this assay, and inadequate number of viral copies (<250 copies / mL). A negative result must be combined with clinical observations, patient history, and epidemiological information.  Fact Sheet for Patients:   StrictlyIdeas.no  Fact Sheet for Healthcare Providers: BankingDealers.co.za  This test is not yet approved or  cleared by the [place] FDA and has been authorized for detection and/or diagnosis of SARS-CoV-2 by FDA under an Emergency Use Authorization (EUA).  This EUA will remain in effect (meaning this test can be used) for the duration of the COVID-19 declaration under Section 564(b)(1) of the Act, 21 U.S.C. section 360bbb-3(b)(1), unless the authorization is terminated or revoked sooner.  Performed at [facility], [place] [address], [place], [place] [address]   SARS Coronavirus 2 by RT PCR (hospital order, performed in [facility] hospital lab) Nasopharyngeal Nasopharyngeal Swab     Status: None   Collection Time: 03/17/20  7:18 PM  Specimen: Nasopharyngeal Swab  Result Value Ref Range Status   SARS Coronavirus 2 NEGATIVE NEGATIVE Final    Comment: (NOTE) SARS-CoV-2 target nucleic acids are NOT DETECTED.  The SARS-CoV-2 RNA is generally detectable in upper and lower respiratory specimens during the acute phase of infection. The lowest concentration of SARS-CoV-2 viral copies this assay can detect is 250 copies / mL. A negative result does not preclude SARS-CoV-2 infection and should not be used as the sole basis for treatment or other patient management decisions.  A negative result may occur with improper specimen collection / handling, submission of specimen other than nasopharyngeal swab, presence of viral mutation(s) within the areas targeted by this assay, and inadequate number of viral copies (<250 copies / mL). A negative  result must be combined with clinical observations, patient history, and epidemiological information.  Fact Sheet for Patients:   StrictlyIdeas.no  Fact Sheet for Healthcare Providers: BankingDealers.co.za  This test is not yet approved or  cleared by the [place] FDA and has been authorized for detection and/or diagnosis of SARS-CoV-2 by FDA under an Emergency Use Authorization (EUA).  This EUA will remain in effect (meaning this test can be used) for the duration of the COVID-19 declaration under Section 564(b)(1) of the Act, 21 U.S.C. section 360bbb-3(b)(1), unless the authorization is terminated or revoked sooner.  Performed at [facility], [place] [address], [place], [place] [address]      Labs: BNP (last 3 results) No results for input(s): BNP in the last 8760 hours. Basic Metabolic Panel: Recent Labs  Lab 03/18/20 0637 03/19/20 0723 03/20/20 0651 03/21/20 0738 03/22/20 0355  NA 142 143 141 141 140  K 3.5 3.2* 3.2* 3.6 3.7  CL 114* 112* 110 114* 112*  CO2 21* 23 24 23 23   GLUCOSE 92 89 86 82 87  BUN 9 5* 6 5* 5*  CREATININE 0.91 0.82 0.87 0.88 0.96  CALCIUM 8.5* 8.7* 8.7* 8.6* 8.6*  MG  --  1.6* 2.0  --   --   PHOS  --  4.1 3.9  --   --    Liver Function Tests: Recent Labs  Lab 03/17/20 1608 03/18/20 0637 03/19/20 0723 03/20/20 0651  AST 18 20 18 17   ALT 13 14 15 15   ALKPHOS 43 36* 38 38  BILITOT 0.7 1.8* 0.9 0.5  PROT 5.3* 4.6* 4.9* 5.0*  ALBUMIN 3.3* 2.7* 3.0* 3.0*   No results for input(s): LIPASE, AMYLASE in the last 168 hours. No results for input(s): AMMONIA in the last 168 hours. CBC: Recent Labs  Lab 03/18/20 0637 03/18/20 0958 03/19/20 0723 03/19/20 1402 03/20/20 0651 03/20/20 1421 03/20/20 2132 03/21/20 0738 03/22/20 0355  WBC 3.5*  --  4.3  --  4.9  --   --  3.7* 4.0  NEUTROABS  --   --  2.4  --   --   --   --   --   --   HGB 8.0*   < > 8.7*   < > 8.5* 8.5* 8.8* 8.2* 7.7*   HCT 25.3*   < > 28.1*   < > 27.3* 27.1* 27.7* 26.1* 25.1*  MCV 83.0  --  82.2  --  83.2  --   --  83.9 84.5  PLT 162  --  189  --  194  --   --  205 188   < > = values in this interval not displayed.   Cardiac Enzymes: No results for input(s): CKTOTAL,  CKMB, CKMBINDEX, TROPONINI in the last 168 hours. BNP: Invalid input(s): POCBNP CBG: No results for input(s): GLUCAP in the last 168 hours. D-Dimer No results for input(s): DDIMER in the last 72 hours. Hgb A1c No results for input(s): HGBA1C in the last 72 hours. Lipid Profile No results for input(s): CHOL, HDL, LDLCALC, TRIG, CHOLHDL, LDLDIRECT in the last 72 hours. Thyroid function studies No results for input(s): TSH, T4TOTAL, T3FREE, THYROIDAB in the last 72 hours.  Invalid input(s): FREET3 Anemia work up No results for input(s): VITAMINB12, FOLATE, FERRITIN, TIBC, IRON, RETICCTPCT in the last 72 hours. Urinalysis    Component Value Date/Time   COLORURINE AMBER BIOCHEMICALS MAY BE AFFECTED BY COLOR (A) 09/06/2007 0905   APPEARANCEUR CLOUDY (A) 09/06/2007 0905   LABSPEC 1.026 09/06/2007 0905   PHURINE 5.0 09/06/2007 0905   GLUCOSEU NEGATIVE 09/06/2007 0905   HGBUR SMALL (A) 09/06/2007 0905   BILIRUBINUR NEGATIVE 09/06/2007 0905   KETONESUR NEGATIVE 09/06/2007 0905   PROTEINUR NEGATIVE 09/06/2007 0905   UROBILINOGEN 0.2 09/06/2007 0905   NITRITE NEGATIVE 09/06/2007 0905   LEUKOCYTESUR NEGATIVE 09/06/2007 0905   Sepsis Labs Invalid input(s): PROCALCITONIN,  WBC,  LACTICIDVEN Microbiology Recent Results (from the past 240 hour(s))  SARS Coronavirus 2 by RT PCR (hospital order, performed in [place] hospital lab) Nasopharyngeal Nasopharyngeal Swab     Status: None   Collection Time: 03/13/20 12:53 PM   Specimen: Nasopharyngeal Swab  Result Value Ref Range Status   SARS Coronavirus 2 NEGATIVE NEGATIVE Final    Comment: (NOTE) SARS-CoV-2 target nucleic acids are NOT DETECTED.  The SARS-CoV-2 RNA is generally  detectable in upper and lower respiratory specimens during the acute phase of infection. The lowest concentration of SARS-CoV-2 viral copies this assay can detect is 250 copies / mL. A negative result does not preclude SARS-CoV-2 infection and should not be used as the sole basis for treatment or other patient management decisions.  A negative result may occur with improper specimen collection / handling, submission of specimen other than nasopharyngeal swab, presence of viral mutation(s) within the areas targeted by this assay, and inadequate number of viral copies (<250 copies / mL). A negative result must be combined with clinical observations, patient history, and epidemiological information.  Fact Sheet for Patients:   StrictlyIdeas.no  Fact Sheet for Healthcare Providers: BankingDealers.co.za  This test is not yet approved or  cleared by the [place] FDA and has been authorized for detection and/or diagnosis of SARS-CoV-2 by FDA under an Emergency Use Authorization (EUA).  This EUA will remain in effect (meaning this test can be used) for the duration of the COVID-19 declaration under Section 564(b)(1) of the Act, 21 U.S.C. section 360bbb-3(b)(1), unless the authorization is terminated or revoked sooner.  Performed at [facility], [place] [address] [place], [place] [address]   SARS Coronavirus 2 by RT PCR (hospital order, performed in [facility] hospital lab) Nasopharyngeal Nasopharyngeal Swab     Status: None   Collection Time: 03/17/20  7:18 PM   Specimen: Nasopharyngeal Swab  Result Value Ref Range Status   SARS Coronavirus 2 NEGATIVE NEGATIVE Final    Comment: (NOTE) SARS-CoV-2 target nucleic acids are NOT DETECTED.  The SARS-CoV-2 RNA is generally detectable in upper and lower respiratory specimens during the acute phase of infection. The lowest concentration of SARS-CoV-2 viral copies this assay can detect is  250 copies / mL. A negative result does not preclude SARS-CoV-2 infection and should not be used as the sole  basis for treatment or other patient management decisions.  A negative result may occur with improper specimen collection / handling, submission of specimen other than nasopharyngeal swab, presence of viral mutation(s) within the areas targeted by this assay, and inadequate number of viral copies (<250 copies / mL). A negative result must be combined with clinical observations, patient history, and epidemiological information.  Fact Sheet for Patients:   StrictlyIdeas.no  Fact Sheet for Healthcare Providers: BankingDealers.co.za  This test is not yet approved or  cleared by the [place] FDA and has been authorized for detection and/or diagnosis of SARS-CoV-2 by FDA under an Emergency Use Authorization (EUA).  This EUA will remain in effect (meaning this test can be used) for the duration of the COVID-19 declaration under Section 564(b)(1) of the Act, 21 U.S.C. section 360bbb-3(b)(1), unless the authorization is terminated or revoked sooner.  Performed at [facility], [place] [address] [place], [place] [address]      Time coordinating discharge:  35 minutes  SIGNED:   Barb Merino, MD  [facility] 03/22/2020, 10:07 AM

## 2020-03-22 NOTE — Plan of Care (Signed)
  Problem: Education: Goal: Knowledge of General Education information will improve Description: Including pain rating scale, medication(s)/side effects and non-pharmacologic comfort measures 03/22/2020 1014 by Everlean Patterson, RN Outcome: Adequate for Discharge 03/22/2020 1014 by Everlean Patterson, RN Outcome: Adequate for Discharge   Problem: Health Behavior/Discharge Planning: Goal: Ability to manage health-related needs will improve 03/22/2020 1014 by Everlean Patterson, RN Outcome: Adequate for Discharge 03/22/2020 1014 by Everlean Patterson, RN Outcome: Adequate for Discharge   Problem: Clinical Measurements: Goal: Ability to maintain clinical measurements within normal limits will improve 03/22/2020 1014 by Everlean Patterson, RN Outcome: Adequate for Discharge 03/22/2020 1014 by Everlean Patterson, RN Outcome: Adequate for Discharge Goal: Will remain free from infection 03/22/2020 1014 by Everlean Patterson, RN Outcome: Adequate for Discharge 03/22/2020 1014 by Everlean Patterson, RN Outcome: Adequate for Discharge Goal: Diagnostic test results will improve 03/22/2020 1014 by Everlean Patterson, RN Outcome: Adequate for Discharge 03/22/2020 1014 by Everlean Patterson, RN Outcome: Adequate for Discharge Goal: Respiratory complications will improve 03/22/2020 1014 by Everlean Patterson, RN Outcome: Adequate for Discharge 03/22/2020 1014 by Everlean Patterson, RN Outcome: Adequate for Discharge Goal: Cardiovascular complication will be avoided 03/22/2020 1014 by Everlean Patterson, RN Outcome: Adequate for Discharge 03/22/2020 1014 by Everlean Patterson, RN Outcome: Adequate for Discharge   Problem: Activity: Goal: Risk for activity intolerance will decrease 03/22/2020 1014 by Everlean Patterson, RN Outcome: Adequate for Discharge 03/22/2020 1014 by Everlean Patterson, RN Outcome: Adequate for Discharge   Problem: Nutrition: Goal: Adequate nutrition will be maintained 03/22/2020 1014 by Everlean Patterson, RN Outcome: Adequate for Discharge 03/22/2020 1014  by Everlean Patterson, RN Outcome: Adequate for Discharge   Problem: Coping: Goal: Level of anxiety will decrease 03/22/2020 1014 by Everlean Patterson, RN Outcome: Adequate for Discharge 03/22/2020 1014 by Everlean Patterson, RN Outcome: Adequate for Discharge   Problem: Elimination: Goal: Will not experience complications related to bowel motility 03/22/2020 1014 by Everlean Patterson, RN Outcome: Adequate for Discharge 03/22/2020 1014 by Everlean Patterson, RN Outcome: Adequate for Discharge Goal: Will not experience complications related to urinary retention 03/22/2020 1014 by Everlean Patterson, RN Outcome: Adequate for Discharge 03/22/2020 1014 by Everlean Patterson, RN Outcome: Adequate for Discharge   Problem: Pain Managment: Goal: General experience of comfort will improve 03/22/2020 1014 by Everlean Patterson, RN Outcome: Adequate for Discharge 03/22/2020 1014 by Everlean Patterson, RN Outcome: Adequate for Discharge   Problem: Safety: Goal: Ability to remain free from injury will improve 03/22/2020 1014 by Everlean Patterson, RN Outcome: Adequate for Discharge 03/22/2020 1014 by Everlean Patterson, RN Outcome: Adequate for Discharge   Problem: Skin Integrity: Goal: Risk for impaired skin integrity will decrease 03/22/2020 1014 by Everlean Patterson, RN Outcome: Adequate for Discharge 03/22/2020 1014 by Everlean Patterson, RN Outcome: Adequate for Discharge

## 2020-03-22 NOTE — Discharge Instructions (Signed)

## 2020-07-04 ENCOUNTER — Encounter (HOSPITAL_COMMUNITY): Payer: Self-pay

## 2020-07-04 ENCOUNTER — Other Ambulatory Visit: Payer: Self-pay

## 2020-07-04 DIAGNOSIS — Z96653 Presence of artificial knee joint, bilateral: Secondary | ICD-10-CM | POA: Diagnosis not present

## 2020-07-04 DIAGNOSIS — Z23 Encounter for immunization: Secondary | ICD-10-CM | POA: Diagnosis not present

## 2020-07-04 DIAGNOSIS — Z79899 Other long term (current) drug therapy: Secondary | ICD-10-CM | POA: Diagnosis not present

## 2020-07-04 DIAGNOSIS — Z20822 Contact with and (suspected) exposure to covid-19: Secondary | ICD-10-CM | POA: Diagnosis not present

## 2020-07-04 DIAGNOSIS — K922 Gastrointestinal hemorrhage, unspecified: Principal | ICD-10-CM | POA: Insufficient documentation

## 2020-07-04 DIAGNOSIS — Z87891 Personal history of nicotine dependence: Secondary | ICD-10-CM | POA: Insufficient documentation

## 2020-07-04 DIAGNOSIS — K921 Melena: Secondary | ICD-10-CM | POA: Diagnosis present

## 2020-07-04 DIAGNOSIS — D649 Anemia, unspecified: Secondary | ICD-10-CM | POA: Insufficient documentation

## 2020-07-04 NOTE — ED Triage Notes (Signed)
Pt presents with c/o rectal bleeding since Sunday evening. Pt reports she was referred here today by her PCP because of the bleeding. Pt also says her PCP said her HR was high but HR is 84 in triage at this time.

## 2020-07-05 ENCOUNTER — Observation Stay (HOSPITAL_COMMUNITY)
Admission: EM | Admit: 2020-07-05 | Discharge: 2020-07-06 | Disposition: A | Discharge status: Home / Self Care | Payer: Medicare Other | Attending: Internal Medicine | Admitting: Internal Medicine

## 2020-07-05 ENCOUNTER — Observation Stay (HOSPITAL_COMMUNITY): Payer: Medicare Other | Admitting: Anesthesiology

## 2020-07-05 ENCOUNTER — Encounter (HOSPITAL_COMMUNITY): Payer: Self-pay | Admitting: Internal Medicine

## 2020-07-05 ENCOUNTER — Encounter (HOSPITAL_COMMUNITY)
Admission: EM | Disposition: A | Discharge status: Home / Self Care | Payer: Self-pay | Source: Home / Self Care | Attending: Emergency Medicine

## 2020-07-05 DIAGNOSIS — Z20822 Contact with and (suspected) exposure to covid-19: Secondary | ICD-10-CM | POA: Diagnosis not present

## 2020-07-05 DIAGNOSIS — I10 Essential (primary) hypertension: Secondary | ICD-10-CM

## 2020-07-05 DIAGNOSIS — D649 Anemia, unspecified: Secondary | ICD-10-CM | POA: Diagnosis not present

## 2020-07-05 DIAGNOSIS — K922 Gastrointestinal hemorrhage, unspecified: Secondary | ICD-10-CM | POA: Diagnosis not present

## 2020-07-05 DIAGNOSIS — Z96653 Presence of artificial knee joint, bilateral: Secondary | ICD-10-CM | POA: Diagnosis not present

## 2020-07-05 DIAGNOSIS — M069 Rheumatoid arthritis, unspecified: Secondary | ICD-10-CM | POA: Diagnosis present

## 2020-07-05 HISTORY — PX: ESOPHAGOGASTRODUODENOSCOPY (EGD) WITH PROPOFOL: SHX5813

## 2020-07-05 HISTORY — PX: SUBMUCOSAL INJECTION: SHX5543

## 2020-07-05 HISTORY — PX: HOT HEMOSTASIS: SHX5433

## 2020-07-05 HISTORY — PX: HEMOSTASIS CLIP PLACEMENT: SHX6857

## 2020-07-05 LAB — COMPREHENSIVE METABOLIC PANEL
ALT: 12 U/L (ref 0–44)
ALT: 12 U/L (ref 0–44)
AST: 17 U/L (ref 15–41)
AST: 15 U/L (ref 15–41)
Albumin: 3.4 g/dL — ABNORMAL LOW (ref 3.5–5.0)
Albumin: 3 g/dL — ABNORMAL LOW (ref 3.5–5.0)
Alkaline Phosphatase: 42 U/L (ref 38–126)
Alkaline Phosphatase: 37 U/L — ABNORMAL LOW (ref 38–126)
Anion gap: 7 (ref 5–15)
Anion gap: 11 (ref 5–15)
BUN: 11 mg/dL (ref 6–20)
BUN: 11 mg/dL (ref 6–20)
CO2: 23 mmol/L (ref 22–32)
CO2: 22 mmol/L (ref 22–32)
Calcium: 8.7 mg/dL — ABNORMAL LOW (ref 8.9–10.3)
Calcium: 8.6 mg/dL — ABNORMAL LOW (ref 8.9–10.3)
Chloride: 107 mmol/L (ref 98–111)
Chloride: 106 mmol/L (ref 98–111)
Creatinine, Ser: 0.81 mg/dL (ref 0.44–1.00)
Creatinine, Ser: 0.85 mg/dL (ref 0.44–1.00)
GFR, Estimated: >60 mL/min (ref >60)
GFR, Estimated: >60 mL/min (ref >60)
Glucose, Bld: 92 mg/dL (ref 70–99)
Glucose, Bld: 85 mg/dL (ref 70–99)
Potassium: 3.2 mmol/L — ABNORMAL LOW (ref 3.5–5.1)
Potassium: 3.3 mmol/L — ABNORMAL LOW (ref 3.5–5.1)
Sodium: 137 mmol/L (ref 135–145)
Sodium: 139 mmol/L (ref 135–145)
Total Bilirubin: 0.3 mg/dL (ref 0.3–1.2)
Total Bilirubin: 0.7 mg/dL (ref 0.3–1.2)
Total Protein: 6.1 g/dL — ABNORMAL LOW (ref 6.5–8.1)
Total Protein: 5.5 g/dL — ABNORMAL LOW (ref 6.5–8.1)

## 2020-07-05 LAB — POCT I-STAT, CHEM 8
BUN: 9 mg/dL (ref 6–20)
Calcium, Ion: 1.21 mmol/L (ref 1.15–1.40)
Chloride: 107 mmol/L (ref 98–111)
Creatinine, Ser: 0.8 mg/dL (ref 0.44–1.00)
Glucose, Bld: 62 mg/dL — ABNORMAL LOW (ref 70–99)
HCT: 19 % — ABNORMAL LOW (ref 36.0–46.0)
Hemoglobin: 6.5 g/dL — CL (ref 12.0–15.0)
Potassium: 4 mmol/L (ref 3.5–5.1)
Sodium: 139 mmol/L (ref 135–145)
TCO2: 20 mmol/L — ABNORMAL LOW (ref 22–32)

## 2020-07-05 LAB — CBC
HCT: 18.6 % — ABNORMAL LOW (ref 36.0–46.0)
HCT: 16.2 % — ABNORMAL LOW (ref 36.0–46.0)
HCT: 24.9 % — ABNORMAL LOW (ref 36.0–46.0)
Hemoglobin: 5.5 g/dL — CL (ref 12.0–15.0)
Hemoglobin: 4.9 g/dL — CL (ref 12.0–15.0)
Hemoglobin: 8.4 g/dL — ABNORMAL LOW (ref 12.0–15.0)
MCH: 24.3 pg — ABNORMAL LOW (ref 26.0–34.0)
MCH: 24.6 pg — ABNORMAL LOW (ref 26.0–34.0)
MCH: 27.5 pg (ref 26.0–34.0)
MCHC: 29.6 g/dL — ABNORMAL LOW (ref 30.0–36.0)
MCHC: 30.2 g/dL (ref 30.0–36.0)
MCHC: 33.7 g/dL (ref 30.0–36.0)
MCV: 82.3 fL (ref 80.0–100.0)
MCV: 81.4 fL (ref 80.0–100.0)
MCV: 81.6 fL (ref 80.0–100.0)
Platelets: 381 10*3/uL (ref 150–400)
Platelets: 301 10*3/uL (ref 150–400)
Platelets: 406 10*3/uL — ABNORMAL HIGH (ref 150–400)
RBC: 2.26 MIL/uL — ABNORMAL LOW (ref 3.87–5.11)
RBC: 1.99 MIL/uL — ABNORMAL LOW (ref 3.87–5.11)
RBC: 3.05 MIL/uL — ABNORMAL LOW (ref 3.87–5.11)
RDW: 17 % — ABNORMAL HIGH (ref 11.5–15.5)
RDW: 17 % — ABNORMAL HIGH (ref 11.5–15.5)
RDW: 15.9 % — ABNORMAL HIGH (ref 11.5–15.5)
WBC: 8.1 10*3/uL (ref 4.0–10.5)
WBC: 8.1 10*3/uL (ref 4.0–10.5)
WBC: 10.9 10*3/uL — ABNORMAL HIGH (ref 4.0–10.5)
nRBC: 0 % (ref 0.0–0.2)
nRBC: 0 % (ref 0.0–0.2)
nRBC: 0.2 % (ref 0.0–0.2)

## 2020-07-05 LAB — RESPIRATORY PANEL BY RT PCR (FLU A&B, COVID)
Influenza A by PCR: NEGATIVE
Influenza B by PCR: NEGATIVE
SARS Coronavirus 2 by RT PCR: NEGATIVE

## 2020-07-05 LAB — PROTIME-INR
INR: 1.1 (ref 0.8–1.2)
Prothrombin Time: 13.8 seconds (ref 11.4–15.2)

## 2020-07-05 LAB — PREPARE RBC (CROSSMATCH)

## 2020-07-05 LAB — GLUCOSE, CAPILLARY
Glucose-Capillary: 97 mg/dL (ref 70–99)
Glucose-Capillary: 85 mg/dL (ref 70–99)

## 2020-07-05 SURGERY — ESOPHAGOGASTRODUODENOSCOPY (EGD) WITH PROPOFOL
Anesthesia: Monitor Anesthesia Care

## 2020-07-05 MED ORDER — POTASSIUM CHLORIDE 10 MEQ/100ML IV SOLN
10.0000 meq | INTRAVENOUS | Status: AC
  Administered 2020-07-05 (×4): 10 meq via INTRAVENOUS
  Filled 2020-07-05: qty 100

## 2020-07-05 MED ORDER — PROPOFOL 500 MG/50ML IV EMUL
INTRAVENOUS | Status: DC | PRN
  Administered 2020-07-05: 100 ug/kg/min via INTRAVENOUS

## 2020-07-05 MED ORDER — SUCRALFATE 1 G PO TABS
1.0000 g | ORAL_TABLET | Freq: Three times a day (TID) | ORAL | Status: DC
  Administered 2020-07-05 – 2020-07-06 (×3): 1 g via ORAL
  Filled 2020-07-05 (×3): qty 1

## 2020-07-05 MED ORDER — SODIUM CHLORIDE 0.9 % IV SOLN
8.0000 mg/h | INTRAVENOUS | Status: DC
  Administered 2020-07-05 (×2): 8 mg/h via INTRAVENOUS
  Filled 2020-07-05 (×3): qty 80

## 2020-07-05 MED ORDER — ACETAMINOPHEN 650 MG RE SUPP: 650.0000 mg | Freq: Four times a day (QID) | RECTAL | Status: DC | PRN

## 2020-07-05 MED ORDER — SODIUM CHLORIDE 0.9% IV SOLUTION: Freq: Once | INTRAVENOUS | Status: AC

## 2020-07-05 MED ORDER — LACTATED RINGERS IV SOLN
INTRAVENOUS | Status: AC | PRN
  Administered 2020-07-05: 10 mL/h via INTRAVENOUS

## 2020-07-05 MED ORDER — SODIUM CHLORIDE (PF) 0.9 % IJ SOLN
PREFILLED_SYRINGE | INTRAMUSCULAR | Status: DC | PRN
  Administered 2020-07-05: 2 mL

## 2020-07-05 MED ORDER — INFLUENZA VAC SPLIT QUAD 0.5 ML IM SUSY
0.5000 mL | PREFILLED_SYRINGE | INTRAMUSCULAR | Status: AC
  Administered 2020-07-06: 0.5 mL via INTRAMUSCULAR
  Filled 2020-07-05: qty 0.5

## 2020-07-05 MED ORDER — ACETAMINOPHEN 325 MG PO TABS
650.0000 mg | ORAL_TABLET | Freq: Four times a day (QID) | ORAL | Status: DC | PRN
  Administered 2020-07-05 (×2): 650 mg via ORAL
  Filled 2020-07-05 (×2): qty 2

## 2020-07-05 MED ORDER — LIDOCAINE 2% (20 MG/ML) 5 ML SYRINGE
INTRAMUSCULAR | Status: DC | PRN
  Administered 2020-07-05: 80 mg via INTRAVENOUS

## 2020-07-05 MED ORDER — DEXTROSE 50 % IV SOLN
12.5000 g | Freq: Once | INTRAVENOUS | Status: AC
  Administered 2020-07-05: 12.5 g via INTRAVENOUS

## 2020-07-05 MED ORDER — PROPOFOL 500 MG/50ML IV EMUL
INTRAVENOUS | Status: AC
  Filled 2020-07-05: qty 50

## 2020-07-05 MED ORDER — EPINEPHRINE 1 MG/10ML IJ SOSY
PREFILLED_SYRINGE | INTRAMUSCULAR | Status: AC
  Filled 2020-07-05: qty 10

## 2020-07-05 MED ORDER — PROPOFOL 10 MG/ML IV BOLUS
INTRAVENOUS | Status: AC
  Filled 2020-07-05: qty 20

## 2020-07-05 MED ORDER — PNEUMOCOCCAL VAC POLYVALENT 25 MCG/0.5ML IJ INJ
0.5000 mL | INJECTION | INTRAMUSCULAR | Status: AC
  Administered 2020-07-06: 0.5 mL via INTRAMUSCULAR
  Filled 2020-07-05: qty 0.5

## 2020-07-05 MED ORDER — DEXTROSE 50 % IV SOLN
INTRAVENOUS | Status: AC
  Filled 2020-07-05: qty 50

## 2020-07-05 MED ORDER — SODIUM CHLORIDE 0.9 % IV SOLN
80.0000 mg | Freq: Once | INTRAVENOUS | Status: AC
  Administered 2020-07-05: 80 mg via INTRAVENOUS
  Filled 2020-07-05: qty 80

## 2020-07-05 MED ORDER — PROPOFOL 10 MG/ML IV BOLUS
INTRAVENOUS | Status: DC | PRN
  Administered 2020-07-05 (×3): 20 mg via INTRAVENOUS
  Administered 2020-07-05: 10 mg via INTRAVENOUS
  Administered 2020-07-05: 30 mg via INTRAVENOUS

## 2020-07-05 MED ORDER — SODIUM CHLORIDE 0.9 % IV SOLN: INTRAVENOUS | Status: DC

## 2020-07-05 SURGICAL SUPPLY — 15 items

## 2020-07-05 NOTE — Progress Notes (Signed)
Hypoglycemic Event  CBG: 62   Treatment: D50 25 mL (12.5 gm)  Symptoms: Sweaty  Follow-up CBG: Time:1425  CBG Result:97  Possible Reasons for Event: Inadequate meal intake  Comments/MD notified:yes    Belinda Block

## 2020-07-05 NOTE — ED Notes (Signed)
Date and time results received: 07/05/20 2:48 AM  (use smartphrase ".now" to insert current time)  Test: Hemoglobin Critical Value: 5.5  Name of Provider Notified: Fredderick Phenix. PA  Orders Received? Or Actions Taken?:

## 2020-07-05 NOTE — Anesthesia Preprocedure Evaluation (Addendum)
Anesthesia Evaluation  Patient identified by MRN, date of birth, ID band Patient awake    Reviewed: Allergy & Precautions, NPO status , Patient's Chart, lab work & pertinent test results, reviewed documented beta blocker date and time   Airway Mallampati: I  TM Distance: >3 FB Neck ROM: Full    Dental no notable dental hx. (+) Teeth Intact, Dental Advisory Given   Pulmonary former smoker,  Quit smoking 2011   Pulmonary exam normal breath sounds clear to auscultation       Cardiovascular hypertension, Pt. on home beta blockers Normal cardiovascular exam Rhythm:Regular Rate:Normal     Neuro/Psych    GI/Hepatic GERD  Medicated and Controlled,GIB history of gastric bypass with admission in July 2021 about 3 months ago for GI bleed at that time EGD showed gastric ulcer and underwent procedure with clipping over the last 1 week he has been experiencing black stools off and on with some epigastric discomfort.  Had gone to gastroenterologist Dr. Collene Mares who did stool for occult blood was positive and was referred to the ER  Per pt, has been having dark stools but no BRBPR and no hematemesis    Endo/Other  Obesity BMI 34  Renal/GU      Musculoskeletal  (+) Arthritis , Osteoarthritis and Rheumatoid disorders,    Abdominal (+) + obese,   Peds  Hematology  (+) Blood dyscrasia, anemia , H/H this AM 4.9/16, s/p 2 units pRBCs   Anesthesia Other Findings   Reproductive/Obstetrics                            Anesthesia Physical Anesthesia Plan  ASA: IV  Anesthesia Plan: MAC   Post-op Pain Management:    Induction:   PONV Risk Score and Plan: 2 and Propofol infusion and TIVA  Airway Management Planned: Natural Airway and Simple Face Mask  Additional Equipment: None  Intra-op Plan:   Post-operative Plan:   Informed Consent: I have reviewed the patients History and Physical, chart, labs and  discussed the procedure including the risks, benefits and alternatives for the proposed anesthesia with the patient or authorized representative who has indicated his/her understanding and acceptance.       Plan Discussed with: CRNA  Anesthesia Plan Comments: (Hb 4.9 this AM- now s/p 2 units pRBCs, will recheck H/H preop)       Anesthesia Quick Evaluation

## 2020-07-05 NOTE — Consult Note (Signed)
Reason for Consult: GI bleed Referring Physician: [facility]  Mackenzie Rivera HPI:  This is a 59 y.o. 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  She was evaluated in the office by Dr. Collene Mares yesterday and she was noted to have melenic stools and epigastric abdominal pain.  In the end of June and later in July she was admitted for similar presentation.  The EGD on 03/14/2020 was negative for any active bleeding and she was treated with medication, but the 03/19/2020 EGD was positive for two marginal ulcers that were actively bleeding.  One showed a visible vessel and it was clipped.  The other ulceration was injected with Epi and hemoclipped.  On Sunday she noted that her stools were black and it was associated with diarrhea.  At first she thought it was as a result of taking her iron pills consistently, but then she felt that she need to be evaluated by Dr. Collene Mares.  Consistently she reports using her Dexilant on a daily basis.  The patient's admission HGB was 5.5 g/dL and the most recent was 4.9 g/dL.  Past Medical History:  Diagnosis Date  . Abnormal pap   . Amenorrhea    s/p ablation  . Anovulation    h/o chronic   . GERD (gastroesophageal reflux disease)   . H/O dysmenorrhea   . History of anemia   . HPV in female   . HSV-2 infection   . Hx of menorrhagia   . Hypertension   . Mastodynia    h/o  . Morbid obesity ([place])   . Obesity   . Osteoarthritis   . Pelvic pain in female    h/o  . Rheumatoid arthritis(714.0)   . Yeast infection    h/o- on pap smear    Past Surgical History:  Procedure Laterality Date  . BIOPSY  03/14/2020   Procedure: BIOPSY;  Surgeon: Juanita Craver, MD;  Location: [facility];  Service: Gastroenterology;;  . COLONOSCOPY  08/29/2012   Procedure: COLONOSCOPY;  Surgeon: Juanita Craver, MD;  Location: [facility];  Service: Endoscopy;  Laterality: N/A;  . COLONOSCOPY N/A 12/24/2017   Procedure:  COLONOSCOPY;  Surgeon: Carol Ada, MD;  Location: [facility];  Service: Endoscopy;  Laterality: N/A;  . ENDOMETRIAL ABLATION W/ NOVASURE  11/2007  . ESOPHAGOGASTRODUODENOSCOPY N/A 12/24/2017   Procedure: ESOPHAGOGASTRODUODENOSCOPY (EGD);  Surgeon: Carol Ada, MD;  Location: [facility];  Service: Endoscopy;  Laterality: N/A;  . ESOPHAGOGASTRODUODENOSCOPY (EGD) WITH PROPOFOL N/A 03/14/2020   Procedure: ESOPHAGOGASTRODUODENOSCOPY (EGD) WITH PROPOFOL;  Surgeon: Juanita Craver, MD;  Location: [facility] END[facility]COPY;  Service: Gastroenterology;  Laterality: N/A;  . ESOPHAGOGASTRODUODENOSCOPY (EGD) WITH PROPOFOL N/A 03/19/2020   Procedure: ESOPHAGOGASTRODUODENOSCOPY (EGD) WITH PROPOFOL;  Surgeon: Juanita Craver, MD;  Location: [facility] ENDOSCOPY;  Service: Endoscopy;  Laterality: N/A;  . GASTRIC BYPASS  02/2003  . HEMOSTASIS CLIP PLACEMENT  03/19/2020   Procedure: HEMOSTASIS CLIP PLACEMENT;  Surgeon: Juanita Craver, MD;  Location: [place];  Service: Endoscopy;;  . KNEE ARTHROSCOPY  07/2006   Bilateral  . SCLEROTHERAPY  03/19/2020   Procedure: Clide Deutscher;  Surgeon: Juanita Craver, MD;  Location: [facility] ENDOSCOPY;  Service: Endoscopy;;  . TOTAL KNEE ARTHROPLASTY  08/2007   Left  . TOTAL KNEE ARTHROPLASTY  07/2007   Right    Family History  Problem Relation Age of Onset  . Kidney disease Father   . Diabetes Maternal Uncle   . Diabetes Paternal Uncle   .  Colon cancer Neg Hx     Social History:  reports that she quit smoking about 10 years ago. She has never used smokeless tobacco. She reports current alcohol use. She reports that she does not use drugs.  Allergies: No Known Allergies  Medications:  Scheduled: . sodium chloride   Intravenous Once  . [START ON 07/06/2020] influenza vac split quadrivalent PF  0.5 mL Intramuscular Tomorrow-1000  . [START ON 07/06/2020] pneumococcal 23 valent vaccine  0.5 mL Intramuscular Tomorrow-1000   Continuous: . pantoprozole (PROTONIX) infusion Stopped (07/05/20 0400)     Results for orders placed or performed during the hospital encounter of 07/05/20 (from the past 24 hour(s))  Comprehensive metabolic panel     Status: Abnormal   Collection Time: 07/05/20  2:15 AM  Result Value Ref Range   Sodium 137 135 - 145 mmol/L   Potassium 3.2 (L) 3.5 - 5.1 mmol/L   Chloride 107 98 - 111 mmol/L   CO2 23 22 - 32 mmol/L   Glucose, Bld 92 70 - 99 mg/dL   BUN 11 6 - 20 mg/dL   Creatinine, Ser 0.81 0.44 - 1.00 mg/dL   Calcium 8.7 (L) 8.9 - 10.3 mg/dL   Total Protein 6.1 (L) 6.5 - 8.1 g/dL   Albumin 3.4 (L) 3.5 - 5.0 g/dL   AST 17 15 - 41 U/L   ALT 12 0 - 44 U/L   Alkaline Phosphatase 42 38 - 126 U/L   Total Bilirubin 0.3 0.3 - 1.2 mg/dL   GFR, Estimated >60 >60 mL/min   Anion gap 7 5 - 15  CBC     Status: Abnormal   Collection Time: 07/05/20  2:15 AM  Result Value Ref Range   WBC 8.1 4.0 - 10.5 K/uL   RBC 2.26 (L) 3.87 - 5.11 MIL/uL   Hemoglobin 5.5 (LL) 12.0 - 15.0 g/dL   HCT 18.6 (L) 36 - 46 %   MCV 82.3 80.0 - 100.0 fL   MCH 24.3 (L) 26.0 - 34.0 pg   MCHC 29.6 (L) 30.0 - 36.0 g/dL   RDW 17.0 (H) 11.5 - 15.5 %   Platelets 381 150 - 400 K/uL   nRBC 0.0 0.0 - 0.2 %  Type and screen [name]     Status: None (Preliminary result)   Collection Time: 07/05/20  2:15 AM  Result Value Ref Range   ABO/RH(D) A POS    Antibody Screen NEG    Sample Expiration      07/08/2020,2359 Performed at [facility], [address] [place], [place] [address]    Unit Number [id number]    Blood Component Type RED CELLS,LR    Unit division 00    Status of Unit ALLOCATED    Transfusion Status OK TO TRANSFUSE    Crossmatch Result Compatible    Unit Number [id number]    Blood Component Type RED CELLS,LR    Unit division 00    Status of Unit ALLOCATED    Transfusion Status OK TO TRANSFUSE    Crossmatch Result Compatible   Protime-INR     Status: None   Collection Time: 07/05/20  2:15 AM  Result Value Ref Range    Prothrombin Time 13.8 11.4 - 15.2 seconds   INR 1.1 0.8 - 1.2  Respiratory Panel by RT PCR (Flu A&B, Covid) - Nasopharyngeal Swab     Status: None   Collection Time: 07/05/20  2:31 AM   Specimen: Nasopharyngeal Swab  Result Value  Ref Range   SARS Coronavirus 2 by RT PCR NEGATIVE NEGATIVE   Influenza A by PCR NEGATIVE NEGATIVE   Influenza B by PCR NEGATIVE NEGATIVE  Comprehensive metabolic panel     Status: Abnormal   Collection Time: 07/05/20  5:06 AM  Result Value Ref Range   Sodium 139 135 - 145 mmol/L   Potassium 3.3 (L) 3.5 - 5.1 mmol/L   Chloride 106 98 - 111 mmol/L   CO2 22 22 - 32 mmol/L   Glucose, Bld 85 70 - 99 mg/dL   BUN 11 6 - 20 mg/dL   Creatinine, Ser 0.85 0.44 - 1.00 mg/dL   Calcium 8.6 (L) 8.9 - 10.3 mg/dL   Total Protein 5.5 (L) 6.5 - 8.1 g/dL   Albumin 3.0 (L) 3.5 - 5.0 g/dL   AST 15 15 - 41 U/L   ALT 12 0 - 44 U/L   Alkaline Phosphatase 37 (L) 38 - 126 U/L   Total Bilirubin 0.7 0.3 - 1.2 mg/dL   GFR, Estimated >60 >60 mL/min   Anion gap 11 5 - 15  CBC     Status: Abnormal   Collection Time: 07/05/20  5:06 AM  Result Value Ref Range   WBC 8.1 4.0 - 10.5 K/uL   RBC 1.99 (L) 3.87 - 5.11 MIL/uL   Hemoglobin 4.9 (LL) 12.0 - 15.0 g/dL   HCT 16.2 (L) 36 - 46 %   MCV 81.4 80.0 - 100.0 fL   MCH 24.6 (L) 26.0 - 34.0 pg   MCHC 30.2 30.0 - 36.0 g/dL   RDW 17.0 (H) 11.5 - 15.5 %   Platelets 301 150 - 400 K/uL   nRBC 0.0 0.0 - 0.2 %  Prepare RBC (crossmatch)     Status: None   Collection Time: 07/05/20  6:36 AM  Result Value Ref Range   Order Confirmation      ORDER PROCESSED BY BLOOD BANK Performed at [facility], [place] [address] [place], [place] [address]      No results found.  ROS:  As stated above in the HPI otherwise negative.  Blood pressure (!) 122/39, pulse 72, temperature 98.2 F (36.8 C), temperature source Oral, resp. rate 16, height 5\' 2"  (1.575 m), weight 84.8 kg, SpO2 100 %.    PE: Gen: NAD, Alert and Oriented HEENT:   [place]/AT, EOMI Neck: Supple, no LAD Lungs: CTA Bilaterally CV: RRR without M/G/R ABD: Soft, NTND, +BS Ext: No C/C/E  Assessment/Plan: 1) Marginal ulcer bleeding. 2) Anemia. 3) Melena.   The patient appears to have the same presentation as her second July presentation.  The prior EGD did show a visible vessel with one of the ulcers, but it was only hemoclipped.  If a visible vessel is present again, BICAP and hemoclipping will be applied.  At the time of this evaluation she has not received any blood transfusions.  The situation was discussed with nursing and she is on the way to obtain her two units of PRBC.  Each unit is scheduled to be transfused over two hours.  Completion time will be around 12 PM.  Plan: 1) EGD today. 2) Agree with PPI and blood transfusion. 3) She needs a repeat CBC before the procedure to ensure that her HGB increased appropriately.  Kellie Chisolm D 07/05/2020, 7:04 AM

## 2020-07-05 NOTE — Progress Notes (Signed)
Patient arrived via stretcher from the ED, ambulated into the bathroom to void before getting in bed, vss, afebrile, independent, last BM was 10/20, +bsx4, no abdominal tenderness, lungs clear, 2 IV sites in left arm, IV Protonix infusing currently, NPO with sips for meds, tylenol given for headache, instructed on call bell use, bed locked and low, awaiting admission orders, will continue to monitor.

## 2020-07-05 NOTE — Progress Notes (Signed)
PROGRESS NOTE    Mackenzie Rivera  MVH:[id number] DOB: 1960/12/05 DOA: 07/05/2020 PCP: Willey Blade, MD   Chief Complain: Rectal bleeding  Brief Narrative:  Patient is a 59 year old female with history of gastric bypass who presented to the emergency department with complaints of bloody stools for a week, epigastric discomfort.  She was seen by her gastroenterologist in the office where occult blood was positive and was referred to ER.  She was admitted in July 2021 for similar complaint at the time EGD showed gastric ulcer and underwent clipping.  On presentation, she was hemodynamically stable.  Hemoglobin was in the range of 5, she was also found to be hypokalemic.  She was started on Protonix infusion and was given 2 units of PRBC.  GI consulted.  Plan for EGD today.  Assessment & Plan:   Principal Problem:   Acute GI bleeding Active Problems:   Hypertension   Rheumatoid arthritis ([place])   Acute upper GI bleeding: Presented with bloody stools, epigastric discomfort.  She had similar episode in June 2021 when she was found to have gastric ulcers.  GI consulted and following.  Plan  for EGD today.  Continue Protonix  Acute blood loss normocytic anemia: Hemoglobin is in the range of 5 on presentation.  Being transfused with 2 units of PRBC.  We will continue to monitor H&H  Hypertension: Currently blood pressure medications are on hold, monitor blood pressure  History of rheumatoid arthritis: Stable  Hypokalemia: Being supplemented with potassium.  We will continue to monitor            DVT prophylaxis:SCD Code Status: Full Family Communication: None at the bedside Status is: Observation  The patient remains OBS appropriate and will d/c before 2 midnights.  Dispo: The patient is from: Home              Anticipated d/c is to: Home              Anticipated d/c date is: 1 day              Patient currently is not medically stable to d/c.    Consultants:  GI  Procedures:None  Antimicrobials:  Anti-infectives (From admission, onward)   None      Subjective:  Patient seen and examined at the bedside this morning.  Hemodynamically stable and comfortable in my evaluation.  No active bleeding during my evaluation denies any abdomen pain.  Waiting for EGD today.  Objective: Vitals:   07/05/20 0300 07/05/20 0330 07/05/20 0446 07/05/20 0744  BP: 104/67 127/90 (!) 122/39 (!) 105/51  Pulse: 80 80 72 79  Resp: 20 19 16 16   Temp:   98.2 F (36.8 C)   TempSrc:   Oral   SpO2: 99% 100% 100% 100%  Weight:      Height:        Intake/Output Summary (Last 24 hours) at 07/05/2020 0751 Last data filed at 07/05/2020 0500 Gross per 24 hour  Intake 201.69 ml  Output --  Net 201.69 ml   Filed Weights   07/05/20 0209  Weight: 84.8 kg    Examination:  General exam: Appears calm and comfortable ,Not in distress,obese HEENT:PERRL,Oral mucosa moist, Ear/Nose normal on gross exam Respiratory system: Bilateral equal air entry, normal vesicular breath sounds, no wheezes or crackles  Cardiovascular system: S1 & S2 heard, RRR. No JVD, murmurs, rubs, gallops or clicks. No pedal edema. Gastrointestinal system: Abdomen is nondistended, soft and nontender. No organomegaly or masses felt. Normal  bowel sounds heard. Central nervous system: Alert and oriented. No focal neurological deficits. Extremities: No edema, no clubbing ,no cyanosis Skin: No rashes, lesions or ulcers,no icterus ,no pallor  Data Reviewed: I have personally reviewed following labs and imaging studies  CBC: Recent Labs  Lab 07/05/20 0215 07/05/20 0506  WBC 8.1 8.1  HGB 5.5* 4.9*  HCT 18.6* 16.2*  MCV 82.3 81.4  PLT 381 130   Basic Metabolic Panel: Recent Labs  Lab 07/05/20 0215 07/05/20 0506  NA 137 139  K 3.2* 3.3*  CL 107 106  CO2 23 22  GLUCOSE 92 85  BUN 11 11  CREATININE 0.81 0.85  CALCIUM 8.7* 8.6*   GFR: Estimated Creatinine Clearance: 72.9 mL/min (by  C-G formula based on SCr of 0.85 mg/dL). Liver Function Tests: Recent Labs  Lab 07/05/20 0215 07/05/20 0506  AST 17 15  ALT 12 12  ALKPHOS 42 37*  BILITOT 0.3 0.7  PROT 6.1* 5.5*  ALBUMIN 3.4* 3.0*   No results for input(s): LIPASE, AMYLASE in the last 168 hours. No results for input(s): AMMONIA in the last 168 hours. Coagulation Profile: Recent Labs  Lab 07/05/20 0215  INR 1.1   Cardiac Enzymes: No results for input(s): CKTOTAL, CKMB, CKMBINDEX, TROPONINI in the last 168 hours. BNP (last 3 results) No results for input(s): PROBNP in the last 8760 hours. HbA1C: No results for input(s): HGBA1C in the last 72 hours. CBG: No results for input(s): GLUCAP in the last 168 hours. Lipid Profile: No results for input(s): CHOL, HDL, LDLCALC, TRIG, CHOLHDL, LDLDIRECT in the last 72 hours. Thyroid Function Tests: No results for input(s): TSH, T4TOTAL, FREET4, T3FREE, THYROIDAB in the last 72 hours. Anemia Panel: No results for input(s): VITAMINB12, FOLATE, FERRITIN, TIBC, IRON, RETICCTPCT in the last 72 hours. Sepsis Labs: No results for input(s): PROCALCITON, LATICACIDVEN in the last 168 hours.  Recent Results (from the past 240 hour(s))  Respiratory Panel by RT PCR (Flu A&B, Covid) - Nasopharyngeal Swab     Status: None   Collection Time: 07/05/20  2:31 AM   Specimen: Nasopharyngeal Swab  Result Value Ref Range Status   SARS Coronavirus 2 by RT PCR NEGATIVE NEGATIVE Final    Comment: (NOTE) SARS-CoV-2 target nucleic acids are NOT DETECTED.  The SARS-CoV-2 RNA is generally detectable in upper respiratoy specimens during the acute phase of infection. The lowest concentration of SARS-CoV-2 viral copies this assay can detect is 131 copies/mL. A negative result does not preclude SARS-Cov-2 infection and should not be used as the sole basis for treatment or other patient management decisions. A negative result may occur with  improper specimen collection/handling, submission of  specimen other than nasopharyngeal swab, presence of viral mutation(s) within the areas targeted by this assay, and inadequate number of viral copies (<131 copies/mL). A negative result must be combined with clinical observations, patient history, and epidemiological information. The expected result is Negative.  Fact Sheet for Patients:  PinkCheek.be  Fact Sheet for Healthcare Providers:  GravelBags.it  This test is no t yet approved or cleared by the [place] FDA and  has been authorized for detection and/or diagnosis of SARS-CoV-2 by FDA under an Emergency Use Authorization (EUA). This EUA will remain  in effect (meaning this test can be used) for the duration of the COVID-19 declaration under Section 564(b)(1) of the Act, 21 U.S.C. section 360bbb-3(b)(1), unless the authorization is terminated or revoked sooner.     Influenza A by PCR NEGATIVE NEGATIVE Final   Influenza  B by PCR NEGATIVE NEGATIVE Final    Comment: (NOTE) The Xpert Xpress SARS-CoV-2/FLU/RSV assay is intended as an aid in  the diagnosis of influenza from Nasopharyngeal swab specimens and  should not be used as a sole basis for treatment. Nasal washings and  aspirates are unacceptable for Xpert Xpress SARS-CoV-2/FLU/RSV  testing.  Fact Sheet for Patients: PinkCheek.be  Fact Sheet for Healthcare Providers: GravelBags.it  This test is not yet approved or cleared by the [place] FDA and  has been authorized for detection and/or diagnosis of SARS-CoV-2 by  FDA under an Emergency Use Authorization (EUA). This EUA will remain  in effect (meaning this test can be used) for the duration of the  Covid-19 declaration under Section 564(b)(1) of the Act, 21  U.S.C. section 360bbb-3(b)(1), unless the authorization is  terminated or revoked. Performed at [facility], [place]  [address] [place], [place] [address]          Radiology Studies: No results found.      Scheduled Meds: . sodium chloride   Intravenous Once  . [START ON 07/06/2020] influenza vac split quadrivalent PF  0.5 mL Intramuscular Tomorrow-1000  . [START ON 07/06/2020] pneumococcal 23 valent vaccine  0.5 mL Intramuscular Tomorrow-1000   Continuous Infusions: . pantoprozole (PROTONIX) infusion Stopped (07/05/20 0400)     LOS: 0 days    Time spent:35 mins. More than 50% of that time was spent in counseling and/or coordination of care.      Shelly Coss, MD [facility] P10/22/2021, 7:51 AM

## 2020-07-05 NOTE — Plan of Care (Signed)

## 2020-07-05 NOTE — Op Note (Signed)
[facility] Patient Name: Mackenzie Rivera Procedure Date: 07/05/2020 MRN: [id number] Attending MD: Carol Ada , MD Date of Birth: Mar 14, 1961 CSN: [id number] Age: 59 Admit Type: Inpatient Procedure:                Upper GI endoscopy Indications:              Acute post hemorrhagic anemia, Melena Providers:                Carol Ada, MD, Cleda Daub, RN, Fransico Setters                            [place], Technician Referring MD:              Medicines:                Propofol per Anesthesia Complications:            No immediate complications. Estimated Blood Loss:     Estimated blood loss: none. Procedure:                Pre-Anesthesia Assessment:                           - Prior to the procedure, a History and Physical                            was performed, and patient medications and                            allergies were reviewed. The patient's tolerance of                            previous anesthesia was also reviewed. The risks                            and benefits of the procedure and the sedation                            options and risks were discussed with the patient.                            All questions were answered, and informed consent                            was obtained. Prior Anticoagulants: The patient has                            taken no previous anticoagulant or antiplatelet                            agents. ASA Grade Assessment: II - A patient with                            mild systemic disease. After reviewing the risks  and benefits, the patient was deemed in                            satisfactory condition to undergo the procedure.                           - Sedation was administered by an anesthesia                            professional. Deep sedation was attained.                           After obtaining informed consent, the endoscope was                            passed under direct  vision. Throughout the                            procedure, the patient's blood pressure, pulse, and                            oxygen saturations were monitored continuously. The                            [id number]-[id number] ([id number]) was introduced through the                            mouth, and advanced to the proximal jejunum. The                            upper GI endoscopy was accomplished without                            difficulty. The patient tolerated the procedure                            well. Scope In: Scope Out: Findings:      The esophagus was normal.      One non-bleeding cratered and superficial gastric ulcer with no stigmata       of bleeding was found at the anastomosis. The lesion was 15 mm in       largest dimension.      A single 3 mm papule (nodule) with no bleeding and no stigmata of recent       bleeding was found at the anastomosis. Area was successfully injected       with 2 mL of a 1:10,000 solution of epinephrine for hemostasis.       Estimated blood loss: none. Coagulation for tissue destruction using       bipolar probe was successful. To prevent bleeding post-intervention,       three hemostatic clips were successfully placed (MR unsafe). There was       no bleeding at the end of the procedure.      There was evidence of a widely patent previous surgical anastomosis in       the afferent jejunal loop. This was characterized by healthy appearing  mucosa. Impression:               - Normal esophagus.                           - Non-bleeding gastric ulcer with no stigmata of                            bleeding.                           - A single papule (nodule) found in the stomach.                            Injected. Treated with bipolar cautery. Clips (MR                            unsafe) were placed.                           - Widely patent previous surgical anastomosis,                            characterized by healthy appearing mucosa was  found                            in the jejunum.                           - No specimens collected. Moderate Sedation:      Not Applicable - Patient had care per Anesthesia. Recommendation:           - Return patient to hospital ward for ongoing care.                           - Resume regular diet.                           - Continue present medications.                           - PPI BID x 1 month and then QD, indefinitely.                           - Sucralfate 1 gram TID.                           - Continue to avoid NSAIDs.                           - If bleeding recurs, it may be necessary to                            discuss with surgery about a possible revision.                           - Dr. Loletha Carrow will be  covering for the weekend. Procedure Code(s):        --- Professional ---                           [phone], Esophagogastroduodenoscopy, flexible,                            transoral; with ablation of tumor(s), polyp(s), or                            other lesion(s) (includes pre- and post-dilation                            and guide wire passage, when performed)                           43255, 67, Esophagogastroduodenoscopy, flexible,                            transoral; with control of bleeding, any method Diagnosis Code(s):        --- Professional ---                           K25.9, Gastric ulcer, unspecified as acute or                            chronic, without hemorrhage or perforation                           K31.89, Other diseases of stomach and duodenum                           Z98.0, Intestinal bypass and anastomosis status                           D62, Acute posthemorrhagic anemia                           K92.1, Melena (includes Hematochezia) CPT copyright 2019 American Medical Association. All rights reserved. The codes documented in this report are preliminary and upon coder review may  be revised to meet current compliance requirements. Carol Ada, MD Carol Ada, MD 07/05/2020 3:37:08 PM This report has been signed electronically. Number of Addenda: 0

## 2020-07-05 NOTE — Transfer of Care (Signed)
Immediate Anesthesia Transfer of Care Note  Patient: Mackenzie Rivera  Procedure(s) Performed: ESOPHAGOGASTRODUODENOSCOPY (EGD) WITH PROPOFOL (N/A ) SUBMUCOSAL INJECTION HEMOSTASIS CLIP PLACEMENT HOT HEMOSTASIS (ARGON PLASMA COAGULATION/BICAP) (N/A )  Patient Location: Endoscopy Unit  Anesthesia Type:MAC  Level of Consciousness: drowsy and patient cooperative  Airway & Oxygen Therapy: Patient Spontanous Breathing and Patient connected to nasal cannula oxygen  Post-op Assessment: Report given to RN and Post -op Vital signs reviewed and stable  Post vital signs: Reviewed and stable  Last Vitals:  Vitals Value Taken Time  BP    Temp    Pulse 74 07/05/20 1534  Resp 21 07/05/20 1534  SpO2 100 % 07/05/20 1534  Vitals shown include unvalidated device data.  Last Pain:  Vitals:   07/05/20 1438  TempSrc: Oral  PainSc:          Complications: No complications documented.

## 2020-07-05 NOTE — ED Provider Notes (Signed)
[facility] DEPT Provider Note   CSN: [id number] Arrival date & time: 07/04/20  1811     History Chief Complaint  Patient presents with  . Rectal Bleeding    Mackenzie Rivera is a 59 y.o. female.  59 year old female with history of rheumatoid arthritis on Actemra, gastric bypass surgery, GERD, HTN, GIB 2/2 ulcer at gastrojejunal anastomosis presents to the ED for evaluation of melena.  Patient reports melanotic stool x1 week.  Her bowel movements have been less due to decreased oral intake secondary to postprandial pain.  Has been feeling progressively lightheaded and short of breath.  Reports palpitations characterized as a fast/rapid heartbeat.  She has not had any fevers or syncopal episodes, vomiting.  Continues to avoid NSAID use. Not chronically anticoagulated. Has been compliant with her Dexilant.  Admitted last in July for GIB requiring transfusion of 6 units of PRBCs, 1 unit iron transfusion.  Sent to ED by GI doctor, Dr. Collene Mares, for evaluation. Was hemoccult positive in her office today.  The history is provided by the patient. No language interpreter was used.       Past Medical History:  Diagnosis Date  . Abnormal pap   . Amenorrhea    s/p ablation  . Anovulation    h/o chronic   . GERD (gastroesophageal reflux disease)   . H/O dysmenorrhea   . History of anemia   . HPV in female   . HSV-2 infection   . Hx of menorrhagia   . Hypertension   . Mastodynia    h/o  . Morbid obesity ([place])   . Obesity   . Osteoarthritis   . Pelvic pain in female    h/o  . Rheumatoid arthritis(714.0)   . Yeast infection    h/o- on pap smear    Patient Active Problem List   Diagnosis Date Noted  . Acute GI bleeding 07/05/2020  . Bradycardia 03/20/2020  . Anemia due to GI blood loss   . GI bleed 03/13/2020  . 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 Abnormal pap   . Hypertension   . Obesity   . Osteoarthritis   .  Rheumatoid arthritis ([place])   . Hx of menorrhagia   . HPV in female   . H/O dysmenorrhea   . Pelvic pain in female   . Morbid obesity ([place])   . History of anemia   . Mastodynia   . Anovulation   . HSV-2 infection   . Hx of candidal vulvovaginitis   . GERD (gastroesophageal reflux disease)   . Yeast infection   . Amenorrhea     Past Surgical History:  Procedure Laterality Date  . BIOPSY  03/14/2020   Procedure: BIOPSY;  Surgeon: Juanita Craver, MD;  Location: [facility];  Service: Gastroenterology;;  . COLONOSCOPY  08/29/2012   Procedure: COLONOSCOPY;  Surgeon: Juanita Craver, MD;  Location: [facility];  Service: Endoscopy;  Laterality: N/A;  . COLONOSCOPY N/A 12/24/2017   Procedure: COLONOSCOPY;  Surgeon: Carol Ada, MD;  Location: [facility];  Service: Endoscopy;  Laterality: N/A;  . ENDOMETRIAL ABLATION W/ NOVASURE  11/2007  . ESOPHAGOGASTRODUODENOSCOPY N/A 12/24/2017   Procedure: ESOPHAGOGASTRODUODENOSCOPY (EGD);  Surgeon: Carol Ada, MD;  Location: [facility];  Service: Endoscopy;  Laterality: N/A;  . ESOPHAGOGASTRODUODENOSCOPY (EGD) WITH PROPOFOL N/A 03/14/2020   Procedure: ESOPHAGOGASTRODUODENOSCOPY (EGD) WITH PROPOFOL;  Surgeon: Juanita Craver, MD;  Location: [facility] ENDOSCOPY;  Service: Gastroenterology;  Laterality: N/A;  . ESOPHAGOGASTRODUODENOSCOPY (EGD) WITH PROPOFOL N/A 03/19/2020  Procedure: ESOPHAGOGASTRODUODENOSCOPY (EGD) WITH PROPOFOL;  Surgeon: Juanita Craver, MD;  Location: [facility] ENDOSCOPY;  Service: Endoscopy;  Laterality: N/A;  . GASTRIC BYPASS  02/2003  . HEMOSTASIS CLIP PLACEMENT  03/19/2020   Procedure: HEMOSTASIS CLIP PLACEMENT;  Surgeon: Juanita Craver, MD;  Location: [facility];  Service: Endoscopy;;  . KNEE ARTHROSCOPY  07/2006   Bilateral  . SCLEROTHERAPY  03/19/2020   Procedure: Clide Deutscher;  Surgeon: Juanita Craver, MD;  Location: [facility] ENDOSCOPY;  Service: Endoscopy;;  . TOTAL KNEE ARTHROPLASTY  08/2007   Left  . TOTAL KNEE ARTHROPLASTY  07/2007   Right     OB  History    Gravida  0   Para  0   Term  0   Preterm  0   AB  0   Living  0     SAB  0   TAB  0   Ectopic  0   Multiple  0   Live Births              Family History  Problem Relation Age of Onset  . Kidney disease Father   . Diabetes Maternal Uncle   . Diabetes Paternal Uncle   . Colon cancer Neg Hx     Social History   Tobacco Use  . Smoking status: Former Smoker    Quit date: 09/14/2009    Years since quitting: 10.8  . Smokeless tobacco: Never Used  Vaping Use  . Vaping Use: Never used  Substance Use Topics  . Alcohol use: Yes    Comment: 2 glasses wine on weekend  . Drug use: No    Home Medications Prior to Admission medications   Medication Sig Start Date End Date Taking? Authorizing Provider  atenolol (TENORMIN) 50 MG tablet Take 50 mg by mouth daily.   Yes [provider]  buPROPion (WELLBUTRIN SR) 100 MG 12 hr tablet Take 100 mg by mouth in the morning and at bedtime. 06/11/20  Yes [provider]  calcium citrate (CALCITRATE - DOSED IN MG ELEMENTAL CALCIUM) 950 MG tablet Take 1 tablet by mouth daily.     Yes [provider]  DEXILANT 60 MG capsule Take 1 capsule (60 mg total) by mouth daily. 03/22/20 07/05/20 Yes Ghimire, Dante Gang, MD  Ferrous Sulfate (IRON) 325 (65 FE) MG TABS Take 325 mg by mouth daily.    Yes [provider]  predniSONE (DELTASONE) 5 MG tablet Take 5 mg by mouth in the morning and at bedtime. 07/01/20  Yes [provider]  sucralfate (CARAFATE) 1 g tablet Take 1 g by mouth 3 (three) times daily as needed (ulcer).    Yes [provider]  vitamin B-12 (CYANOCOBALAMIN) 100 MCG tablet Take 50 mcg by mouth daily.     Yes [provider]    Allergies    Patient has no known allergies.  Review of Systems   Review of Systems  Ten systems reviewed and are negative for acute change, except as noted in the HPI.    Physical Exam Updated Vital Signs BP 104/67   Pulse 80    Temp 99.9 F (37.7 C) (Oral)   Resp 20   Ht 5\' 2"  (1.575 m)   Wt 84.8 kg   SpO2 99%   BMI 34.20 kg/m   Physical Exam Vitals and nursing note reviewed.  Constitutional:      General: She is not in acute distress.    Appearance: She is well-developed. She is not diaphoretic.  Comments: Nontoxic appearing and in NAD  HENT:     Head: Normocephalic and atraumatic.     Mouth/Throat:     Comments: Pale mucous membranes Eyes:     General: No scleral icterus.    Conjunctiva/sclera: Conjunctivae normal.  Cardiovascular:     Rate and Rhythm: Normal rate and regular rhythm.     Pulses: Normal pulses.  Pulmonary:     Effort: Pulmonary effort is normal. No respiratory distress.     Comments: Respirations even and unlabored Abdominal:     Palpations: Abdomen is soft.  Genitourinary:    Comments: Deferred; POC hemoccult in GI office today was positive, per patient. Musculoskeletal:        General: Normal range of motion.     Cervical back: Normal range of motion.  Skin:    General: Skin is warm and dry.     Coloration: Skin is not pale.     Findings: No erythema or rash.  Neurological:     Mental Status: She is alert and oriented to person, place, and time.  Psychiatric:        Behavior: Behavior normal.     ED Results / Procedures / Treatments   Labs (all labs ordered are listed, but only abnormal results are displayed) Labs Reviewed  COMPREHENSIVE METABOLIC PANEL - Abnormal; Notable for the following components:      Result Value   Potassium 3.2 (*)    Calcium 8.7 (*)    Total Protein 6.1 (*)    Albumin 3.4 (*)    All other components within normal limits  CBC - Abnormal; Notable for the following components:   RBC 2.26 (*)    Hemoglobin 5.5 (*)    HCT 18.6 (*)    MCH 24.3 (*)    MCHC 29.6 (*)    RDW 17.0 (*)    All other components within normal limits  RESPIRATORY PANEL BY RT PCR (FLU A&B, COVID)  PROTIME-INR  TYPE AND SCREEN    EKG None  Radiology No  results found.  Procedures .Critical Care Performed by: Antonietta Breach, PA-C Authorized by: Antonietta Breach, PA-C   Critical care provider statement:    Critical care time (minutes):  45   Critical care was necessary to treat or prevent imminent or life-threatening deterioration of the following conditions: UGI bleed.   Critical care was time spent personally by me on the following activities:  Discussions with consultants, evaluation of patient's response to treatment, examination of patient, ordering and performing treatments and interventions, ordering and review of laboratory studies, ordering and review of radiographic studies, pulse oximetry, re-evaluation of patient's condition, obtaining history from patient or surrogate and review of old charts   (including critical care time)  Medications Ordered in ED Medications  pantoprazole (PROTONIX) 80 mg in sodium chloride 0.9 % 100 mL IVPB (has no administration in time range)  pantoprazole (PROTONIX) 80 mg in sodium chloride 0.9 % 100 mL (0.8 mg/mL) infusion (8 mg/hr Intravenous New Bag/Given 07/05/20 0316)    ED Course  I have reviewed the triage vital signs and the nursing notes.  Pertinent labs & imaging results that were available during my care of the patient were reviewed by me and considered in my medical decision making (see chart for details).    MDM Rules/Calculators/A&P                          59 year old female presenting for evaluation following 1  week of melanotic stool.  History of UGI bleed requiring admission and blood transfusions.  Last admitted for this 3 months ago.  Hemoglobin today is 5.5.  She is presently hemodynamically stable.  Will admit for monitoring, likely transfusion.  Dr. Hal Hope to assess in the ED.  Anticipate GI consult in the morning.   Final Clinical Impression(s) / ED Diagnoses Final diagnoses:  UGI bleed  Symptomatic anemia    Rx / DC Orders ED Discharge Orders    None       Antonietta Breach, PA-C 07/05/20 [place], April, MD 07/05/20 [phone]

## 2020-07-05 NOTE — H&P (Signed)
History and Physical    Mackenzie Rivera YNW:[id number] DOB: 03/01/1961 DOA: 07/05/2020  PCP: Willey Blade, MD  Patient coming from: Home.  Chief Complaint: Rectal bleeding.  HPI: Mackenzie Rivera is a 59 y.o. female with history of gastric bypass with admission in July 2021 about 3 months ago for GI bleed at that time EGD showed gastric ulcer and underwent procedure with clipping over the last 1 week he has been experiencing black stools off and on with some epigastric discomfort.  Had gone to gastroenterologist Dr. Collene Mares who did stool for occult blood was positive and was referred to the ER.  Patient denies taking any NSAIDs.  ED Course: In the ER patient is hemodynamically stable with labs showing hemoglobin of 5.5 and potassium of 3.2 patient was started on Protonix infusion RBC transfusion and admitted for further management of acute GI bleed likely from upper GI given the previous history of gastric ulcers.  Gastroenterologist was messaged about the admission.  Covid test was negative.  Review of Systems: As per HPI, rest all negative.   Past Medical History:  Diagnosis Date  . Abnormal pap   . Amenorrhea    s/p ablation  . Anovulation    h/o chronic   . GERD (gastroesophageal reflux disease)   . H/O dysmenorrhea   . History of anemia   . HPV in female   . HSV-2 infection   . Hx of menorrhagia   . Hypertension   . Mastodynia    h/o  . Morbid obesity ([place])   . Obesity   . Osteoarthritis   . Pelvic pain in female    h/o  . Rheumatoid arthritis(714.0)   . Yeast infection    h/o- on pap smear    Past Surgical History:  Procedure Laterality Date  . BIOPSY  03/14/2020   Procedure: BIOPSY;  Surgeon: Juanita Craver, MD;  Location: [facility];  Service: Gastroenterology;;  . COLONOSCOPY  08/29/2012   Procedure: COLONOSCOPY;  Surgeon: Juanita Craver, MD;  Location[facility];  Service: Endoscopy;  Laterality: N/A;  . COLONOSCOPY N/A 12/24/2017   Procedure: COLONOSCOPY;  Surgeon:  Carol Ada, MD;  Location: [facility];  Service: Endoscopy;  Laterality: N/A;  . ENDOMETRIAL ABLATION W/ NOVASURE  11/2007  . ESOPHAGOGASTRODUODENOSCOPY N/A 12/24/2017   Procedure: ESOPHAGOGASTRODUODENOSCOPY (EGD);  Surgeon: Carol Ada, MD;  Location: [facility];  Service: Endoscopy;  Laterality: N/A;  . ESOPHAGOGASTRODUODENOSCOPY (EGD) WITH PROPOFOL N/A 03/14/2020   Procedure: ESOPHAGOGASTRODUODENOSCOPY (EGD) WITH PROPOFOL;  Surgeon: Juanita Craver, MD;  Location[facility] ENDOSCOPY;  Service: Gastroenterology;  Laterality: N/A;  . ESOPHAGOGASTRODUODENOSCOPY (EGD) WITH PROPOFOL N/A 03/19/2020   Procedure: ESOPHAGOGASTRODUODENOSCOPY (EGD) WITH PROPOFOL;  Surgeon: Juanita Craver, MD;  Location: [facility] ENDOSCOPY;  Service: Endoscopy;  Laterality: N/A;  . GASTRIC BYPASS  02/2003  . HEMOSTASIS CLIP PLACEMENT  03/19/2020   Procedure: HEMOSTASIS CLIP PLACEMENT;  Surgeon: Juanita Craver, MD;  Location: [facility];  Service: Endoscopy;;  . KNEE ARTHROSCOPY  07/2006   Bilateral  . SCLEROTHERAPY  03/19/2020   Procedure: Clide Deutscher;  Surgeon: Juanita Craver, MD;  Location: [facility] ENDOSCOPY;  Service: Endoscopy;;  . TOTAL KNEE ARTHROPLASTY  08/2007   Left  . TOTAL KNEE ARTHROPLASTY  07/2007   Right     reports that she quit smoking about 10 years ago. She has never used smokeless tobacco. She reports current alcohol use. She reports that she does not use drugs.  No Known Allergies  Family History  Problem Relation Age of Onset  . Kidney  disease Father   . Diabetes Maternal Uncle   . Diabetes Paternal Uncle   . Colon cancer Neg Hx     Prior to Admission medications   Medication Sig Start Date End Date Taking? Authorizing Provider  atenolol (TENORMIN) 50 MG tablet Take 50 mg by mouth daily.   Yes [provider]  buPROPion (WELLBUTRIN SR) 100 MG 12 hr tablet Take 100 mg by mouth in the morning and at bedtime. 06/11/20  Yes [provider]  calcium citrate (CALCITRATE - DOSED IN MG ELEMENTAL  CALCIUM) 950 MG tablet Take 1 tablet by mouth daily.     Yes [provider]  DEXILANT 60 MG capsule Take 1 capsule (60 mg total) by mouth daily. 03/22/20 07/05/20 Yes Ghimire, Dante Gang, MD  Ferrous Sulfate (IRON) 325 (65 FE) MG TABS Take 325 mg by mouth daily.    Yes [provider]  predniSONE (DELTASONE) 5 MG tablet Take 5 mg by mouth in the morning and at bedtime. 07/01/20  Yes [provider]  sucralfate (CARAFATE) 1 g tablet Take 1 g by mouth 3 (three) times daily as needed (ulcer).    Yes [provider]  vitamin B-12 (CYANOCOBALAMIN) 100 MCG tablet Take 50 mcg by mouth daily.     Yes [provider]    Physical Exam: Constitutional: Moderately built and nourished. Vitals:   07/05/20 0209 07/05/20 0211 07/05/20 0230 07/05/20 0300  BP:  122/73 (!) 119/57 104/67  Pulse:  70 73 80  Resp:  19 (!) 21 20  Temp:      TempSrc:      SpO2:  100% 100% 99%  Weight: 84.8 kg     Height: 5\' 2"  (1.575 m)      Eyes: Anicteric no pallor. ENMT: No discharge from the ears eyes nose or mouth. Neck: No mass felt.  No neck rigidity. Respiratory: No rhonchi or crepitations. Cardiovascular: S1-S2 heard. Abdomen: Soft nontender bowel sounds present. Musculoskeletal: No edema. Skin: No rash. Neurologic: Alert awake oriented to time place and person.  Moves all extremities. Psychiatric: Appears normal.  Normal affect.   Labs on Admission: I have personally reviewed following labs and imaging studies  CBC: Recent Labs  Lab 07/05/20 0215  WBC 8.1  HGB 5.5*  HCT 18.6*  MCV 82.3  PLT 144   Basic Metabolic Panel: Recent Labs  Lab 07/05/20 0215  NA 137  K 3.2*  CL 107  CO2 23  GLUCOSE 92  BUN 11  CREATININE 0.81  CALCIUM 8.7*   GFR: Estimated Creatinine Clearance: 76.5 mL/min (by C-G formula based on SCr of 0.81 mg/dL). Liver Function Tests: Recent Labs  Lab 07/05/20 0215  AST 17  ALT 12  ALKPHOS 42  BILITOT 0.3  PROT 6.1*  ALBUMIN  3.4*   No results for input(s): LIPASE, AMYLASE in the last 168 hours. No results for input(s): AMMONIA in the last 168 hours. Coagulation Profile: Recent Labs  Lab 07/05/20 0215  INR 1.1   Cardiac Enzymes: No results for input(s): CKTOTAL, CKMB, CKMBINDEX, TROPONINI in the last 168 hours. BNP (last 3 results) No results for input(s): PROBNP in the last 8760 hours. HbA1C: No results for input(s): HGBA1C in the last 72 hours. CBG: No results for input(s): GLUCAP in the last 168 hours. Lipid Profile: No results for input(s): CHOL, HDL, LDLCALC, TRIG, CHOLHDL, LDLDIRECT in the last 72 hours. Thyroid Function Tests: No results for input(s): TSH, T4TOTAL, FREET4, T3FREE, THYROIDAB in the last 72 hours. Anemia  Panel: No results for input(s): VITAMINB12, FOLATE, FERRITIN, TIBC, IRON, RETICCTPCT in the last 72 hours. Urine analysis:    Component Value Date/Time   COLORURINE AMBER BIOCHEMICALS MAY BE AFFECTED BY COLOR (A) 09/06/2007 0905   APPEARANCEUR CLOUDY (A) 09/06/2007 0905   LABSPEC 1.026 09/06/2007 0905   PHURINE 5.0 09/06/2007 0905   GLUCOSEU NEGATIVE 09/06/2007 0905   HGBUR SMALL (A) 09/06/2007 0905   BILIRUBINUR NEGATIVE 09/06/2007 0905   KETONESUR NEGATIVE 09/06/2007 0905   PROTEINUR NEGATIVE 09/06/2007 0905   UROBILINOGEN 0.2 09/06/2007 0905   NITRITE NEGATIVE 09/06/2007 0905   LEUKOCYTESUR NEGATIVE 09/06/2007 0905   Sepsis Labs: @LABRCNTIP (procalcitonin:4,lacticidven:4) ) Recent Results (from the past 240 hour(s))  Respiratory Panel by RT PCR (Flu A&B, Covid) - Nasopharyngeal Swab     Status: None   Collection Time: 07/05/20  2:31 AM   Specimen: Nasopharyngeal Swab  Result Value Ref Range Status   SARS Coronavirus 2 by RT PCR NEGATIVE NEGATIVE Final    Comment: (NOTE) SARS-CoV-2 target nucleic acids are NOT DETECTED.  The SARS-CoV-2 RNA is generally detectable in upper respiratoy specimens during the acute phase of infection. The lowest concentration of  SARS-CoV-2 viral copies this assay can detect is 131 copies/mL. A negative result does not preclude SARS-Cov-2 infection and should not be used as the sole basis for treatment or other patient management decisions. A negative result may occur with  improper specimen collection/handling, submission of specimen other than nasopharyngeal swab, presence of viral mutation(s) within the areas targeted by this assay, and inadequate number of viral copies (<131 copies/mL). A negative result must be combined with clinical observations, patient history, and epidemiological information. The expected result is Negative.  Fact Sheet for Patients:  PinkCheek.be  Fact Sheet for Healthcare Providers:  GravelBags.it  This test is no t yet approved or cleared by the [place] FDA and  has been authorized for detection and/or diagnosis of SARS-CoV-2 by FDA under an Emergency Use Authorization (EUA). This EUA will remain  in effect (meaning this test can be used) for the duration of the COVID-19 declaration under Section 564(b)(1) of the Act, 21 U.S.C. section 360bbb-3(b)(1), unless the authorization is terminated or revoked sooner.     Influenza A by PCR NEGATIVE NEGATIVE Final   Influenza B by PCR NEGATIVE NEGATIVE Final    Comment: (NOTE) The Xpert Xpress SARS-CoV-2/FLU/RSV assay is intended as an aid in  the diagnosis of influenza from Nasopharyngeal swab specimens and  should not be used as a sole basis for treatment. Nasal washings and  aspirates are unacceptable for Xpert Xpress SARS-CoV-2/FLU/RSV  testing.  Fact Sheet for Patients: PinkCheek.be  Fact Sheet for Healthcare Providers: GravelBags.it  This test is not yet approved or cleared by the [place] FDA and  has been authorized for detection and/or diagnosis of SARS-CoV-2 by  FDA under an Emergency Use  Authorization (EUA). This EUA will remain  in effect (meaning this test can be used) for the duration of the  Covid-19 declaration under Section 564(b)(1) of the Act, 21  U.S.C. section 360bbb-3(b)(1), unless the authorization is  terminated or revoked. Performed at [facility], [place] [address], [place], [place] [address]      Radiological Exams on Admission: No results found.   Assessment/Plan Principal Problem:   Acute GI bleeding Active Problems:   Hypertension   Rheumatoid arthritis ([place])    1. Acute GI bleed likely upper GI with EGD in July 2021 showing gastric ulcers presently placed on  Protonix infusion PRBC transfusion n.p.o. in anticipation of GI procedure and gastroenterologist Dr. Mann/Dr. Benson Norway group has been notified through secure chat. 2. Acute blood loss anemia follow CBC after transfusion. 3. History of hypertension takes atenolol alternate days we will keep patient on as needed IV labetalol. 4. History of rheumatoid arthritis presently only on Tylenol.   DVT prophylaxis: SCDs.  Avoiding anticoagulation due to GI bleed. Code Status: Full code. Family Communication: Discussed with patient. Disposition Plan: Home. Consults called: ER physician has messaged the gastroenterologist Dr. Mann/Dr. Benson Norway. Admission status: Observation.   Rise Patience MD [facility] Pager [phone].  If 7PM-7AM, please contact night-coverage [email or web address] Password [email or web address]  07/05/2020, 3:52 AM

## 2020-07-05 NOTE — Anesthesia Postprocedure Evaluation (Signed)
Anesthesia Post Note  Patient: Mackenzie Rivera  Procedure(s) Performed: ESOPHAGOGASTRODUODENOSCOPY (EGD) WITH PROPOFOL (N/A ) SUBMUCOSAL INJECTION HEMOSTASIS CLIP PLACEMENT HOT HEMOSTASIS (ARGON PLASMA COAGULATION/BICAP) (N/A )     Patient location during evaluation: PACU Anesthesia Type: MAC Level of consciousness: awake and alert Pain management: pain level controlled Vital Signs Assessment: post-procedure vital signs reviewed and stable Respiratory status: spontaneous breathing Cardiovascular status: stable Anesthetic complications: no   No complications documented.  Last Vitals:  Vitals:   07/05/20 1542 07/05/20 1656  BP: (!) 127/59 140/79  Pulse: 71 66  Resp: 18 (!) 22  Temp:  36.6 C  SpO2: 100% 100%    Last Pain:  Vitals:   07/05/20 1656  TempSrc: Oral  PainSc:                  Nolon Nations

## 2020-07-06 DIAGNOSIS — K287 Chronic gastrojejunal ulcer without hemorrhage or perforation: Secondary | ICD-10-CM | POA: Diagnosis not present

## 2020-07-06 DIAGNOSIS — K921 Melena: Secondary | ICD-10-CM | POA: Diagnosis not present

## 2020-07-06 DIAGNOSIS — D62 Acute posthemorrhagic anemia: Secondary | ICD-10-CM | POA: Diagnosis not present

## 2020-07-06 DIAGNOSIS — K922 Gastrointestinal hemorrhage, unspecified: Secondary | ICD-10-CM | POA: Diagnosis not present

## 2020-07-06 LAB — CBC WITH DIFFERENTIAL/PLATELET
Abs Immature Granulocytes: 0.05 10*3/uL (ref 0.00–0.07)
Basophils Absolute: 0 10*3/uL (ref 0.0–0.1)
Basophils Relative: 0 %
Eosinophils Absolute: 0.1 10*3/uL (ref 0.0–0.5)
Eosinophils Relative: 1 %
HCT: 24.3 % — ABNORMAL LOW (ref 36.0–46.0)
Hemoglobin: 7.9 g/dL — ABNORMAL LOW (ref 12.0–15.0)
Immature Granulocytes: 1 %
Lymphocytes Relative: 21 %
Lymphs Abs: 1.8 10*3/uL (ref 0.7–4.0)
MCH: 26.8 pg (ref 26.0–34.0)
MCHC: 32.5 g/dL (ref 30.0–36.0)
MCV: 82.4 fL (ref 80.0–100.0)
Monocytes Absolute: 1.1 10*3/uL — ABNORMAL HIGH (ref 0.1–1.0)
Monocytes Relative: 12 %
Neutro Abs: 5.6 10*3/uL (ref 1.7–7.7)
Neutrophils Relative %: 65 %
Platelets: 301 10*3/uL (ref 150–400)
RBC: 2.95 MIL/uL — ABNORMAL LOW (ref 3.87–5.11)
RDW: 16 % — ABNORMAL HIGH (ref 11.5–15.5)
WBC: 8.7 10*3/uL (ref 4.0–10.5)
nRBC: 0.2 % (ref 0.0–0.2)

## 2020-07-06 LAB — BPAM RBC
Blood Product Expiration Date: 202111032359
Blood Product Expiration Date: 202111032359
Blood Product Expiration Date: 202111092359
ISSUE DATE / TIME: 202110220752
ISSUE DATE / TIME: 202110221048
ISSUE DATE / TIME: 202110221408
Unit Type and Rh: 6200
Unit Type and Rh: 6200
Unit Type and Rh: 6200

## 2020-07-06 LAB — HEMOGLOBIN AND HEMATOCRIT, BLOOD
HCT: 26.2 % — ABNORMAL LOW (ref 36.0–46.0)
Hemoglobin: 8.4 g/dL — ABNORMAL LOW (ref 12.0–15.0)

## 2020-07-06 LAB — TYPE AND SCREEN
ABO/RH(D): A POS
Antibody Screen: NEGATIVE
Unit division: 0
Unit division: 0
Unit division: 0

## 2020-07-06 LAB — BASIC METABOLIC PANEL
Anion gap: 11 (ref 5–15)
BUN: 9 mg/dL (ref 6–20)
CO2: 21 mmol/L — ABNORMAL LOW (ref 22–32)
Calcium: 8.6 mg/dL — ABNORMAL LOW (ref 8.9–10.3)
Chloride: 109 mmol/L (ref 98–111)
Creatinine, Ser: 0.82 mg/dL (ref 0.44–1.00)
GFR, Estimated: >60 mL/min (ref >60)
Glucose, Bld: 82 mg/dL (ref 70–99)
Potassium: 3.6 mmol/L (ref 3.5–5.1)
Sodium: 141 mmol/L (ref 135–145)

## 2020-07-06 MED ORDER — PANTOPRAZOLE SODIUM 40 MG PO TBEC
40.0000 mg | DELAYED_RELEASE_TABLET | Freq: Two times a day (BID) | ORAL | 1 refills | Status: DC
Start: 2020-07-06 — End: 2023-12-24

## 2020-07-06 MED ORDER — SUCRALFATE 1 G PO TABS: 1.0000 g | ORAL_TABLET | Freq: Three times a day (TID) | ORAL | 0 refills | Status: DC | PRN

## 2020-07-06 MED ORDER — PANTOPRAZOLE SODIUM 40 MG PO TBEC
40.0000 mg | DELAYED_RELEASE_TABLET | Freq: Two times a day (BID) | ORAL | Status: DC
  Administered 2020-07-06: 40 mg via ORAL
  Filled 2020-07-06: qty 1

## 2020-07-06 NOTE — Plan of Care (Signed)
  Problem: Clinical Measurements: Goal: Respiratory complications will improve Outcome: Progressing   Problem: Clinical Measurements: Goal: Cardiovascular complication will be avoided Outcome: Progressing   Problem: Safety: Goal: Ability to remain free from injury will improve Outcome: Progressing   Problem: Pain Managment: Goal: General experience of comfort will improve Outcome: Progressing

## 2020-07-06 NOTE — Progress Notes (Signed)
Cottie Banda NP paged with hgb lelel and reports that pt did well with her soft diet. Pt ok to d/c home from GI stand point now.

## 2020-07-06 NOTE — Progress Notes (Addendum)
[facility] Progress Note  CC:  GI bleed, anemia   Subjective: She feels well today. No abdominal pain. No N/V. She passed a formed partially black and partially brown yesterday afternoon after her EGD. No further BMs since then. She tolerated a full diet, advancing to a soft diet for lunch. No further palpitations. No complaints.    Objective:   S/P EGD 07/05/2020: - Normal esophagus. - Non-bleeding gastric ulcer with no stigmata of bleeding found at the anastomosis. - A single papule (nodule) found in the stomach. Injected. Treated with bipolar cautery. Clips (MR unsafe) were placed. -  Widely patent previous surgical anastomosis, characterized by healthy appearing mucosa was found in the jejunum. - No specimens collected.  Vital signs in last 24 hours: Temp:  [97.7 F (36.5 C)-99.5 F (37.5 C)] 98.3 F (36.8 C) (10/23 1007) Pulse Rate:  [60-75] 65 (10/23 1007) Resp:  [12-23] 15 (10/23 1007) BP: (115-140)/(56-79) 118/74 (10/23 1007) SpO2:  [99 %-100 %] 100 % (10/23 1007) Last BM Date: 07/05/20 General:   Alert 59 year old female in NAD. Heart: RRR, no murmur.  Pulm:  Breath sounds clear throughout.  Abdomen: Soft, nontender. Nondistended. + BS x 4 quads.  Extremities:  Without edema. Neurologic:  Alert and  oriented x4;  grossly normal neurologically. Psych:  Alert and cooperative. Normal mood and affect.  Intake/Output from previous day: 10/22 0701 - 10/23 0700 In: 3426.3 [P.O.:1200; I.V.:598.3; Blood:1328; IV Piggyback:300] Out: -  Intake/Output this shift: Total I/O In: 360 [P.O.:360] Out: -   Lab Results: Recent Labs    07/05/20 0506 07/05/20 0506 07/05/20 1354 07/05/20 1612 07/06/20 0330  WBC 8.1  --   --  10.9* 8.7  HGB 4.9*   < > 6.5* 8.4* 7.9*  HCT 16.2*   < > 19.0* 24.9* 24.3*  PLT 301  --   --  406* 301   < > = values in this interval not displayed.   BMET Recent Labs    07/05/20 0215 07/05/20 0215 07/05/20 0506  07/05/20 1354 07/06/20 0330  NA 137   < > 139 139 141  K 3.2*   < > 3.3* 4.0 3.6  CL 107   < > 106 107 109  CO2 23  --  22  --  21*  GLUCOSE 92   < > 85 62* 82  BUN 11   < > 11 9 9   CREATININE 0.81   < > 0.85 0.80 0.82  CALCIUM 8.7*  --  8.6*  --  8.6*   < > = values in this interval not displayed.   LFT Recent Labs    07/05/20 0506  PROT 5.5*  ALBUMIN 3.0*  AST 15  ALT 12  ALKPHOS 37*  BILITOT 0.7   PT/INR Recent Labs    07/05/20 0215  LABPROT 13.8  INR 1.1   Hepatitis Panel No results for input(s): HEPBSAG, HCVAB, HEPAIGM, HEPBIGM in the last 72 hours.  No results found.  Assessment / Plan:  39.  59 year old female with a PHM of Roux-en-Y gastric bypass 2004, anastomotic ulcers, GERD, obesity, and HTN admitted for a GI bleed/melena.  Admission hemoglobin was 5.5 - 4.9.  She received 3 units of packed red blood cells. Hg 8.4 -> 7.9 today. BUN 9.  S/P EGD by Dr. Benson Norway 10/22 which identified a nonbleeding gastric ulcer without stigmata of recent bleeding was found to the anastomosis, a single nodule in the stomach which was injected and APC  applied Endo Clip was placed, widely patent surgical anastomosis site. -PPI twice daily for the next month then daily indefinitely -Carafate 1 g 3 times daily -Avoid NSAIDs -Repeat H/H now -Advance to soft diet. -She will most likely be discharged home if H/H stable  -Follow up with Dr. Benson Norway in 2 weeks   Further recommendations per Dr. Loletha Carrow        Principal Problem:   Acute GI bleeding Active Problems:   Hypertension   Rheumatoid arthritis ([place])     LOS: 0 days   Noralyn Pick  07/06/2020, 11:05 AM  I have discussed the case with the PA, and that is the plan I formulated. I will message Dr. Benson Norway about discharge for any follow up plans.    Nelida Meuse III Office: [phone]

## 2020-07-06 NOTE — Plan of Care (Signed)
Pt stable at this time. No needs or complications. Pt to d/c home when nursing staff ready.

## 2020-07-06 NOTE — Discharge Summary (Signed)
Physician Discharge Summary  Mackenzie Rivera EYC:[id number] DOB: 1961/01/25 DOA: 07/05/2020  PCP: Willey Blade, MD  Admit date: 07/05/2020 Discharge date: 07/06/2020  Admitted From: Home Disposition:  Home  Discharge Condition:Stable CODE STATUS:FULL Diet recommendation:  Heart healthy  Brief/Interim Summary:  Patient is a 59 year old female with history of gastric bypass who presented to the emergency department with complaints of bloody stools for a week, epigastric discomfort.  She was seen by her gastroenterologist in the office where occult blood was positive and was referred to ER.  She was admitted in July 2021 for similar complaint at the time EGD showed gastric ulcer and underwent clipping.  On presentation, she was hemodynamically stable.  Hemoglobin was in the range of 5, she was also found to be hypokalemic.  She was started on Protonix infusion and was given 2 units of PRBC.  GI consulted.    She underwent EGD with finding of nonbleeding gastric ulcer.  Currently no active bleeding.  Hemoglobin stable in the range of 7 today.  She is hemodynamically stable for discharge to home today.  Following problems were addressed during hospitalization:  Acute upper GI bleeding: Presented with bloody stools, epigastric discomfort.  She had similar episode in June 2021 when she was found to have gastric ulcers.  GI consulted and and underwent EGD which showed nonbleeding gastric ulcer.  Plan is to continue Protonix twice daily for a month and then daily, continue Carafate.  She will follow-up with her gastroenterologist as an outpatient.  Acute blood loss normocytic anemia: Hemoglobin is in the range of 5 on presentation.  Transfused with 2 units of PRBC.    Hemoglobin stable in the range of 7 today.  Check hemoglobin in a week  Hypertension: Resume home meds on DC  History of rheumatoid arthritis: Stable  Hypokalemia: Supplemented with potassium.  Discharge Diagnoses:  Principal  Problem:   Acute GI bleeding Active Problems:   Hypertension   Rheumatoid arthritis [facility])    Discharge Instructions  Discharge Instructions    Diet - low sodium heart healthy   Complete by: As directed    Discharge instructions   Complete by: As directed    1)Please take prescribed medications as instructed. 2)Follow up with your PCP and gastroenterologist as an outpatient 3)Do CBC test to check your hemoglobin in a week.   Increase activity slowly   Complete by: As directed      Allergies as of 07/06/2020   No Known Allergies     Medication List    STOP taking these medications   Dexilant 60 MG capsule Generic drug: dexlansoprazole     TAKE these medications   atenolol 50 MG tablet Commonly known as: TENORMIN Take 50 mg by mouth daily.   buPROPion 100 MG 12 hr tablet Commonly known as: WELLBUTRIN SR Take 100 mg by mouth in the morning and at bedtime.   calcium citrate 950 (200 Ca) MG tablet Commonly known as: CALCITRATE - dosed in mg elemental calcium Take 1 tablet by mouth daily.   Iron 325 (65 Fe) MG Tabs Take 325 mg by mouth daily.   pantoprazole 40 MG tablet Commonly known as: PROTONIX Take 1 tablet (40 mg total) by mouth 2 (two) times daily. Continue 1 tablet daily after 1 month.   predniSONE 5 MG tablet Commonly known as: DELTASONE Take 5 mg by mouth in the morning and at bedtime.   sucralfate 1 g tablet Commonly known as: CARAFATE Take 1 tablet (1 g total) by mouth  3 (three) times daily as needed (ulcer).   vitamin B-12 100 MCG tablet Commonly known as: CYANOCOBALAMIN Take 50 mcg by mouth daily.       Follow-up Information    Willey Blade, MD. Schedule an appointment as soon as possible for a visit in 1 week(s).   Specialty: Internal Medicine Contact information: [address] [place] [address] [phone]              No Known Allergies  Consultations:  GI   Procedures/Studies:  No results  found.    Subjective: Patient seen and examined at bedside this morning.  Hemodynamically stable for discharge  Discharge Exam: Vitals:   07/06/20 0529 07/06/20 1007  BP: 115/76 118/74  Pulse: 62 65  Resp: 18 15  Temp: 98 F (36.7 C) 98.3 F (36.8 C)  SpO2: 100% 100%   Vitals:   07/05/20 2035 07/06/20 0227 07/06/20 0529 07/06/20 1007  BP: 118/74 121/66 115/76 118/74  Pulse: 67 60 62 65  Resp: 16 15 18 15   Temp: 98.5 F (36.9 C) 97.7 F (36.5 C) 98 F (36.7 C) 98.3 F (36.8 C)  TempSrc: Oral Oral Oral Oral  SpO2: 100% 100% 100% 100%  Weight:      Height:        General: Pt is alert, awake, not in acute distress Cardiovascular: RRR, S1/S2 +, no rubs, no gallops Respiratory: CTA bilaterally, no wheezing, no rhonchi Abdominal: Soft, NT, ND, bowel sounds + Extremities: no edema, no cyanosis    The results of significant diagnostics from this hospitalization (including imaging, microbiology, ancillary and laboratory) are listed below for reference.     Microbiology: Recent Results (from the past 240 hour(s))  Respiratory Panel by RT PCR (Flu A&B, Covid) - Nasopharyngeal Swab     Status: None   Collection Time: 07/05/20  2:31 AM   Specimen: Nasopharyngeal Swab  Result Value Ref Range Status   SARS Coronavirus 2 by RT PCR NEGATIVE NEGATIVE Final    Comment: (NOTE) SARS-CoV-2 target nucleic acids are NOT DETECTED.  The SARS-CoV-2 RNA is generally detectable in upper respiratoy specimens during the acute phase of infection. The lowest concentration of SARS-CoV-2 viral copies this assay can detect is 131 copies/mL. A negative result does not preclude SARS-Cov-2 infection and should not be used as the sole basis for treatment or other patient management decisions. A negative result may occur with  improper specimen collection/handling, submission of specimen other than nasopharyngeal swab, presence of viral mutation(s) within the areas targeted by this assay, and  inadequate number of viral copies (<131 copies/mL). A negative result must be combined with clinical observations, patient history, and epidemiological information. The expected result is Negative.  Fact Sheet for Patients:  PinkCheek.be  Fact Sheet for Healthcare Providers:  GravelBags.it  This test is no t yet approved or cleared by the [place] FDA and  has been authorized for detection and/or diagnosis of SARS-CoV-2 by FDA under an Emergency Use Authorization (EUA). This EUA will remain  in effect (meaning this test can be used) for the duration of the COVID-19 declaration under Section 564(b)(1) of the Act, 21 U.S.C. section 360bbb-3(b)(1), unless the authorization is terminated or revoked sooner.     Influenza A by PCR NEGATIVE NEGATIVE Final   Influenza B by PCR NEGATIVE NEGATIVE Final    Comment: (NOTE) The Xpert Xpress SARS-CoV-2/FLU/RSV assay is intended as an aid in  the diagnosis of influenza from Nasopharyngeal swab specimens and  should  not be used as a sole basis for treatment. Nasal washings and  aspirates are unacceptable for Xpert Xpress SARS-CoV-2/FLU/RSV  testing.  Fact Sheet for Patients: PinkCheek.be  Fact Sheet for Healthcare Providers: GravelBags.it  This test is not yet approved or cleared by the [place] FDA and  has been authorized for detection and/or diagnosis of SARS-CoV-2 by  FDA under an Emergency Use Authorization (EUA). This EUA will remain  in effect (meaning this test can be used) for the duration of the  Covid-19 declaration under Section 564(b)(1) of the Act, 21  U.S.C. section 360bbb-3(b)(1), unless the authorization is  terminated or revoked. Performed at [facility], [place] [address], [place], [place] [address]      Labs: BNP (last 3 results) No results for input(s): BNP in the last 8760  hours. Basic Metabolic Panel: Recent Labs  Lab 07/05/20 0215 07/05/20 0506 07/05/20 1354 07/06/20 0330  NA 137 139 139 141  K 3.2* 3.3* 4.0 3.6  CL 107 106 107 109  CO2 23 22  --  21*  GLUCOSE 92 85 62* 82  BUN 11 11 9 9   CREATININE 0.81 0.85 0.80 0.82  CALCIUM 8.7* 8.6*  --  8.6*   Liver Function Tests: Recent Labs  Lab 07/05/20 0215 07/05/20 0506  AST 17 15  ALT 12 12  ALKPHOS 42 37*  BILITOT 0.3 0.7  PROT 6.1* 5.5*  ALBUMIN 3.4* 3.0*   No results for input(s): LIPASE, AMYLASE in the last 168 hours. No results for input(s): AMMONIA in the last 168 hours. CBC: Recent Labs  Lab 07/05/20 0215 07/05/20 0506 07/05/20 1354 07/05/20 1612 07/06/20 0330  WBC 8.1 8.1  --  10.9* 8.7  NEUTROABS  --   --   --   --  5.6  HGB 5.5* 4.9* 6.5* 8.4* 7.9*  HCT 18.6* 16.2* 19.0* 24.9* 24.3*  MCV 82.3 81.4  --  81.6 82.4  PLT 381 301  --  406* 301   Cardiac Enzymes: No results for input(s): CKTOTAL, CKMB, CKMBINDEX, TROPONINI in the last 168 hours. BNP: Invalid input(s): POCBNP CBG: Recent Labs  Lab 07/05/20 1426 07/05/20 1545  GLUCAP 97 85   D-Dimer No results for input(s): DDIMER in the last 72 hours. Hgb A1c No results for input(s): HGBA1C in the last 72 hours. Lipid Profile No results for input(s): CHOL, HDL, LDLCALC, TRIG, CHOLHDL, LDLDIRECT in the last 72 hours. Thyroid function studies No results for input(s): TSH, T4TOTAL, T3FREE, THYROIDAB in the last 72 hours.  Invalid input(s): FREET3 Anemia work up No results for input(s): VITAMINB12, FOLATE, FERRITIN, TIBC, IRON, RETICCTPCT in the last 72 hours. Urinalysis    Component Value Date/Time   COLORURINE AMBER BIOCHEMICALS MAY BE AFFECTED BY COLOR (A) 09/06/2007 0905   APPEARANCEUR CLOUDY (A) 09/06/2007 0905   LABSPEC 1.026 09/06/2007 0905   PHURINE 5.0 09/06/2007 0905   GLUCOSEU NEGATIVE 09/06/2007 0905   HGBUR SMALL (A) 09/06/2007 0905   BILIRUBINUR NEGATIVE 09/06/2007 0905   KETONESUR NEGATIVE  09/06/2007 0905   PROTEINUR NEGATIVE 09/06/2007 0905   UROBILINOGEN 0.2 09/06/2007 0905   NITRITE NEGATIVE 09/06/2007 0905   LEUKOCYTESUR NEGATIVE 09/06/2007 0905   Sepsis Labs Invalid input(s): PROCALCITONIN,  WBC,  LACTICIDVEN Microbiology Recent Results (from the past 240 hour(s))  Respiratory Panel by RT PCR (Flu A&B, Covid) - Nasopharyngeal Swab     Status: None   Collection Time: 07/05/20  2:31 AM   Specimen: Nasopharyngeal Swab  Result Value Ref Range Status  SARS Coronavirus 2 by RT PCR NEGATIVE NEGATIVE Final    Comment: (NOTE) SARS-CoV-2 target nucleic acids are NOT DETECTED.  The SARS-CoV-2 RNA is generally detectable in upper respiratoy specimens during the acute phase of infection. The lowest concentration of SARS-CoV-2 viral copies this assay can detect is 131 copies/mL. A negative result does not preclude SARS-Cov-2 infection and should not be used as the sole basis for treatment or other patient management decisions. A negative result may occur with  improper specimen collection/handling, submission of specimen other than nasopharyngeal swab, presence of viral mutation(s) within the areas targeted by this assay, and inadequate number of viral copies (<131 copies/mL). A negative result must be combined with clinical observations, patient history, and epidemiological information. The expected result is Negative.  Fact Sheet for Patients:  PinkCheek.be  Fact Sheet for Healthcare Providers:  GravelBags.it  This test is no t yet approved or cleared by the [place] FDA and  has been authorized for detection and/or diagnosis of SARS-CoV-2 by FDA under an Emergency Use Authorization (EUA). This EUA will remain  in effect (meaning this test can be used) for the duration of the COVID-19 declaration under Section 564(b)(1) of the Act, 21 U.S.C. section 360bbb-3(b)(1), unless the authorization is terminated  or revoked sooner.     Influenza A by PCR NEGATIVE NEGATIVE Final   Influenza B by PCR NEGATIVE NEGATIVE Final    Comment: (NOTE) The Xpert Xpress SARS-CoV-2/FLU/RSV assay is intended as an aid in  the diagnosis of influenza from Nasopharyngeal swab specimens and  should not be used as a sole basis for treatment. Nasal washings and  aspirates are unacceptable for Xpert Xpress SARS-CoV-2/FLU/RSV  testing.  Fact Sheet for Patients: PinkCheek.be  Fact Sheet for Healthcare Providers: GravelBags.it  This test is not yet approved or cleared by the [place] FDA and  has been authorized for detection and/or diagnosis of SARS-CoV-2 by  FDA under an Emergency Use Authorization (EUA). This EUA will remain  in effect (meaning this test can be used) for the duration of the  Covid-19 declaration under Section 564(b)(1) of the Act, 21  U.S.C. section 360bbb-3(b)(1), unless the authorization is  terminated or revoked. Performed at [facility], [place] [address], [place], [place] [address]     Please note: You were cared for by a hospitalist during your hospital stay. Once you are discharged, your primary care physician will handle any further medical issues. Please note that NO REFILLS for any discharge medications will be authorized once you are discharged, as it is imperative that you return to your primary care physician (or establish a relationship with a primary care physician if you do not have one) for your post hospital discharge needs so that they can reassess your need for medications and monitor your lab values.    Time coordinating discharge: 40 minutes  SIGNED:   Shelly Coss, MD  [facility] 07/06/2020, 10:37 AM Pager [phone]  If 7PM-7AM, please contact night-coverage [email or web address] Password [email or web address]

## 2020-07-06 NOTE — Plan of Care (Signed)
  Problem: Education: Goal: Knowledge of General Education information will improve Description: Including pain rating scale, medication(s)/side effects and non-pharmacologic comfort measures Outcome: Progressing   Problem: Pain Managment: Goal: General experience of comfort will improve Outcome: Progressing   

## 2020-07-08 ENCOUNTER — Encounter (HOSPITAL_COMMUNITY): Payer: Self-pay | Admitting: Gastroenterology

## 2021-06-23 ENCOUNTER — Other Ambulatory Visit: Payer: Self-pay | Admitting: Internal Medicine

## 2021-06-23 DIAGNOSIS — Z1231 Encounter for screening mammogram for malignant neoplasm of breast: Secondary | ICD-10-CM

## 2021-07-22 ENCOUNTER — Ambulatory Visit
Admission: RE | Admit: 2021-07-22 | Discharge: 2021-07-22 | Disposition: A | Discharge status: Home / Self Care | Payer: Medicare Other | Source: Ambulatory Visit | Attending: Internal Medicine | Admitting: Internal Medicine

## 2021-07-22 ENCOUNTER — Other Ambulatory Visit: Payer: Self-pay

## 2021-07-22 DIAGNOSIS — Z1231 Encounter for screening mammogram for malignant neoplasm of breast: Secondary | ICD-10-CM

## 2022-05-20 ENCOUNTER — Other Ambulatory Visit: Payer: Self-pay

## 2022-05-20 ENCOUNTER — Encounter (HOSPITAL_COMMUNITY): Payer: Self-pay | Admitting: Emergency Medicine

## 2022-05-20 ENCOUNTER — Ambulatory Visit (HOSPITAL_COMMUNITY)
Admission: EM | Admit: 2022-05-20 | Discharge: 2022-05-20 | Disposition: A | Discharge status: Home / Self Care | Payer: Medicare PPO

## 2022-05-20 DIAGNOSIS — Z96653 Presence of artificial knee joint, bilateral: Secondary | ICD-10-CM

## 2022-05-20 DIAGNOSIS — M25562 Pain in left knee: Secondary | ICD-10-CM

## 2022-05-20 DIAGNOSIS — M25561 Pain in right knee: Secondary | ICD-10-CM

## 2022-05-20 DIAGNOSIS — M25461 Effusion, right knee: Secondary | ICD-10-CM

## 2022-05-20 DIAGNOSIS — K047 Periapical abscess without sinus: Secondary | ICD-10-CM

## 2022-05-20 DIAGNOSIS — Z8739 Personal history of other diseases of the musculoskeletal system and connective tissue: Secondary | ICD-10-CM

## 2022-05-20 MED ORDER — AMOXICILLIN-POT CLAVULANATE 875-125 MG PO TABS: 1.0000 | ORAL_TABLET | Freq: Two times a day (BID) | ORAL | 0 refills | Status: AC

## 2022-05-20 NOTE — ED Triage Notes (Signed)
Bilateral knee pain started Monday night.  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  Pain is gradually getting worse, right worse than left .  No known injury  Patient "googled" this issue and learned that if there had been dental issues addressed recently, there could be an infection per patient.

## 2022-05-20 NOTE — Discharge Instructions (Addendum)
Recommend start Augmentin '875mg'$  twice a day - take with food - as directed. May take OTC Benadryl 25 to 50 mg at night to help with sleep. Continue Prednisone and Tylenol as directed. Continue to monitor tooth pain and bilateral knee pain and swelling. Recommend continue to contact your Orthopedic tomorrow or go to their Urgent Care/After Hours tomorrow for further evaluation. You may need joint aspiration or additional tests. If unable to see your Orthopedic and swelling and pain continues, go to the ER.

## 2022-05-20 NOTE — ED Provider Notes (Signed)
[place]    CSN: [id number] Arrival date & time: 05/20/22  1614      History   Chief Complaint Chief Complaint  Patient presents with   Knee Pain    HPI Bryann Gentz is a 61 y.o. female.   61 year old female presents with bilateral knee pain and swelling and dental pain. Started with left upper and lower tooth pain last week. She has a broken tooth/incisor on left upper area that has become more painful. Also having lower molar pain on left side with gum irritation and redness that started almost 1 week ago. Concerned over possible dental infection and concern over infection going to her prosthetic joints and causing pain and swelling. 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 She has not had problems with her knees since her replacement about 15 years ago. She noticed when walking around 2 days ago that her right knee was more sore and swollen. Then her left knee started hurting. Today her right knee is slightly red, swollen and "hot to touch" on the lateral aspect. Having difficulty flexing her knees and unable to sleep due to pain. She also has a history of Rheumatoid arthritis and is currently on oral Prednisone, usually 5 to '10mg'$  daily but has been taking '20mg'$  for the past 3 days with minimal relief. Unable to take other NSAIDS due to history of GI bleeding. Other health issues include HTN, anemia and history of gastric ulcers. Current other medications include Sulfasalazine, Protonix, Atenolol and multiple supplements daily.   The history is provided by the patient.    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    Hx of menorrhagia    Hypertension    Mastodynia    h/o   Morbid obesity (HCC)    Obesity    Osteoarthritis     Pelvic pain in female    h/o   Rheumatoid arthritis(714.0)    Yeast infection    h/o- on pap smear    Patient Active Problem List   Diagnosis Date Noted   Acute GI bleeding 07/05/2020   Bradycardia 03/20/2020   Anemia due to GI blood loss    GI bleed 03/13/2020   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   Abnormal pap    Hypertension    Obesity    Osteoarthritis    Rheumatoid arthritis (HCC)    Hx of menorrhagia    HPV in female    H/O dysmenorrhea    Pelvic pain in female    Morbid obesity ([place])    History of anemia    Mastodynia    Anovulation    HSV-2 infection    Hx of candidal vulvovaginitis    GERD (gastroesophageal reflux disease)    Yeast infection    Amenorrhea     Past Surgical History:  Procedure Laterality Date   BIOPSY  03/14/2020   Procedure: BIOPSY;  Surgeon: Juanita Craver, MD;  Location: [place];  Service: Gastroenterology;;   COLONOSCOPY  08/29/2012   Procedure: COLONOSCOPY;  Surgeon: Juanita Craver, MD;  Location: [facility];  Service: Endoscopy;  Laterality: N/A;   COLONOSCOPY N/A 12/24/2017  Procedure: COLONOSCOPY;  Surgeon: Carol Ada, MD;  Location: [facility];  Service: Endoscopy;  Laterality: N/A;   ENDOMETRIAL ABLATION W/ NOVASURE  11/2007   ESOPHAGOGASTRODUODENOSCOPY N/A 12/24/2017   Procedure: ESOPHAGOGASTRODUODENOSCOPY (EGD);  Surgeon: Carol Ada, MD;  Location: [facility];  Service: Endoscopy;  Laterality: N/A;   ESOPHAGOGASTRODUODENOSCOPY (EGD) WITH PROPOFOL N/A 03/14/2020   Procedure: ESOPHAGOGASTRODUODENOSCOPY (EGD) WITH PROPOFOL;  Surgeon: Juanita Craver, MD;  Location: [facility] ENDOSCOPY;  Service: Gastroenterology;  Laterality: N/A;   ESOPHAGOGASTRODUODENOSCOPY (EGD) WITH PROPOFOL N/A 03/19/2020   Procedure: ESOPHAGOGASTRODUODENOSCOPY (EGD) WITH PROPOFOL;  Surgeon: Juanita Craver, MD;  Location: [facility] ENDOSCOPY;  Service: Endoscopy;  Laterality: N/A;   ESOPHAGOGASTRODUODENOSCOPY (EGD) WITH PROPOFOL N/A 07/05/2020    Procedure: ESOPHAGOGASTRODUODENOSCOPY (EGD) WITH PROPOFOL;  Surgeon: Carol Ada, MD;  Location: [facility];  Service: Endoscopy;  Laterality: N/A;   GASTRIC BYPASS  02/2003   HEMOSTASIS CLIP PLACEMENT  03/19/2020   Procedure: HEMOSTASIS CLIP PLACEMENT;  Surgeon: Juanita Craver, MD;  Location: [facility] ENDOSCOPY;  Service: Endoscopy;;   HEMOSTASIS CLIP PLACEMENT  07/05/2020   Procedure: HEMOSTASIS CLIP PLACEMENT;  Surgeon: Carol Ada, MD;  Location: [facility];  Service: Endoscopy;;   HOT HEMOSTASIS N/A 07/05/2020   Procedure: HOT HEMOSTASIS (ARGON PLASMA COAGULATION/BICAP);  Surgeon: Carol Ada, MD;  Location: [facility];  Service: Endoscopy;  Laterality: N/A;   KNEE ARTHROSCOPY  07/2006   Bilateral   SCLEROTHERAPY  03/19/2020   Procedure: SCLEROTHERAPY;  Surgeon: Juanita Craver, MD;  Location: [facility] ENDOSCOPY;  Service: Endoscopy;;   SUBMUCOSAL INJECTION  07/05/2020   Procedure: SUBMUCOSAL INJECTION;  Surgeon: Carol Ada, MD;  Location: [facility]OS[facility];  Service: Endoscopy;;   TOTAL KNEE ARTHROPLASTY  08/2007   Left   TOTAL KNEE ARTHROPLASTY  07/2007   Right    OB History     Gravida  0   Para  0   Term  0   Preterm  0   AB  0   Living  0      SAB  0   IAB  0   Ectopic  0   Multiple  0   Live Births               Home Medications    Prior to Admission medications   Medication Sig Start Date End Date Taking? Authorizing Provider  amoxicillin-clavulanate (AUGMENTIN) 875-125 MG tablet Take 1 tablet by mouth every 12 (twelve) hours for 7 days. 05/20/22 05/27/22 Yes Jaris Kohles, Nicholes Stairs, NP  sulfaSALAzine (AZULFIDINE) 500 MG EC tablet Take 1,000 mg by mouth 2 (two) times daily.   Yes [provider]  atenolol (TENORMIN) 50 MG tablet Take 50 mg by mouth daily.    [provider]  calcium citrate (CALCITRATE - DOSED IN MG ELEMENTAL CALCIUM) 950 MG tablet Take 1 tablet by mouth daily.      [provider]  pantoprazole (PROTONIX) 40 MG tablet Take  1 tablet (40 mg total) by mouth 2 (two) times daily. Continue 1 tablet daily after 1 month. 07/06/20   Shelly Coss, MD  predniSONE (DELTASONE) 5 MG tablet Take 5 mg by mouth in the morning and at bedtime. 07/01/20   [provider]  sucralfate (CARAFATE) 1 g tablet Take 1 tablet (1 g total) by mouth 3 (three) times daily as needed (ulcer). 07/06/20   Shelly Coss, MD  vitamin B-12 (CYANOCOBALAMIN) 100 MCG tablet Take 50 mcg by mouth daily.      [provider]    Family History Family History  Problem Relation Age of Onset   Kidney disease Father    Diabetes Maternal Uncle    Diabetes Paternal Uncle    Colon cancer Neg Hx     Social History Social History   Tobacco Use   Smoking status: Former    Types: Cigarettes    Quit date: 09/14/2009    Years since quitting: 12.6   Smokeless tobacco: Never  Vaping Use   Vaping Use: Never used  Substance Use Topics   Alcohol use: Yes    Comment: 2 glasses wine on weekend   Drug use: No     Allergies   Patient has no known allergies.   Review of Systems Review of Systems  Constitutional:  Positive for activity change and fatigue. Negative for appetite change, chills and fever.  HENT:  Positive for dental problem. Negative for sore throat and trouble swallowing.   Respiratory:  Negative for chest tightness and shortness of breath.   Gastrointestinal:  Negative for nausea and vomiting.  Musculoskeletal:  Positive for arthralgias, gait problem, joint swelling and myalgias.  Skin:  Positive for color change. Negative for rash and wound.  Allergic/Immunologic: Positive for immunocompromised state. Negative for environmental allergies and food allergies.  Neurological:  Negative for tremors, seizures, syncope, speech difficulty and numbness.  Hematological:  Negative for adenopathy. Bruises/bleeds easily.  Psychiatric/Behavioral:  Positive for sleep disturbance.      Physical Exam Triage Vital Signs ED Triage  Vitals  Enc Vitals Group     BP 05/20/22 1716 (!) 139/90     Pulse Rate 05/20/22 1716 76     Resp 05/20/22 1716 20     Temp 05/20/22 1716 98.6 F (37 C)     Temp Source 05/20/22 1716 Oral     SpO2 05/20/22 1716 99 %     Weight --      Height --      Head Circumference --      Peak Flow --      Pain Score 05/20/22 1711 8     Pain Loc --      Pain Edu? --      Excl. in [place]? --    No data found.  Updated Vital Signs BP (!) 139/90 (BP Location: Left Arm) Comment (BP Location): large cuff  Pulse 76   Temp 98.6 F (37 C) (Oral)   Resp 20   SpO2 99%   Visual Acuity Right Eye Distance:   Left Eye Distance:   Bilateral Distance:    Right Eye Near:   Left Eye Near:    Bilateral Near:     Physical Exam Vitals and nursing note reviewed.  Constitutional:      General: She is awake. She is not in acute distress.    Appearance: She is well-developed, well-groomed and overweight.     Comments: She is sitting in the exam chair in no acute distress but appears in pain and has difficulty ambulating and changing positions due to pain.   HENT:     Head: Normocephalic.     Jaw: There is normal jaw occlusion. Tenderness and swelling present.      Comments: Slight swelling and tenderness along left upper and lower jaw line. No redness.     Right Ear: Hearing normal.     Left Ear: Hearing normal.     Mouth/Throat:     Lips: Pink.     Mouth: Mucous membranes are moist. No oral lesions.     Dentition: Abnormal  dentition. Dental tenderness and gingival swelling present.     Tongue: No lesions.     Pharynx: Oropharynx is clear. Uvula midline. No pharyngeal swelling, oropharyngeal exudate, posterior oropharyngeal erythema or uvula swelling.      Comments: Redness, swelling and irritation along her left lower molar inner and outer gum line. No distinct discharge. Left upper lateral incisor broken/missing with some inflammation present.  Eyes:     Extraocular Movements: Extraocular  movements intact.     Conjunctiva/sclera: Conjunctivae normal.  Cardiovascular:     Rate and Rhythm: Normal rate.  Pulmonary:     Effort: Pulmonary effort is normal.  Musculoskeletal:        General: Swelling and tenderness present.     Right knee: Swelling and erythema present. No ecchymosis. Decreased range of motion. Tenderness present over the lateral joint line. Normal alignment.     Left knee: Swelling present. No erythema or ecchymosis. Decreased range of motion. Tenderness present over the lateral joint line. Normal alignment.     Right lower leg: No edema.     Left lower leg: No edema.     Right ankle: Normal.     Left ankle: Normal.       Legs:     Comments: Slight redness and warmth on right outer lower lateral aspect of knee/patella. Swollen and tender. Decreased range of motion of right knee- particularly with flexion. Left knee also slightly swollen and tender along lateral aspect. No redness present. Also decreased range of motion of left knee with flexion but more mobility than right. No lower leg pitting edema. Good distal pulses. No neuro deficits noted.   Skin:    General: Skin is warm and dry.     Capillary Refill: Capillary refill takes less than 2 seconds.     Findings: Erythema present. No abrasion, abscess, bruising, ecchymosis, rash or wound.  Neurological:     General: No focal deficit present.     Mental Status: She is alert and oriented to person, place, and time.     Sensory: Sensation is intact. No sensory deficit.     Motor: Motor function is intact.     Gait: Gait abnormal.     Comments: Difficulty ambulating due to pain and swelling in her knees.   Psychiatric:        Mood and Affect: Mood normal.        Behavior: Behavior normal. Behavior is cooperative.        Thought Content: Thought content normal.        Judgment: Judgment normal.      UC Treatments / Results  Labs (all labs ordered are listed, but only abnormal results are displayed) Labs  Reviewed - No data to display  EKG   Radiology No results found.  Procedures Procedures (including critical care time)  Medications Ordered in UC Medications - No data to display  Initial Impression / Assessment and Plan / UC Course  I have reviewed the triage vital signs and the nursing notes.  Pertinent labs & imaging results that were available during my care of the patient were reviewed by me and considered in my medical decision making (see chart for details).     Reviewed with patient that she does appear to have a dental infection of her left lower molar area with also possible early infection of left upper incisor area. Patient does have a dentist but having difficulty with finances. Will start her on Augmentin '875mg'$  twice a day for  10 days as directed. She will call her dentist and try to make financial arrangements to be seen- may need other low cost dental options. May continue daily Prednisone as prescribed and Tylenol '1000mg'$  every 8 hours as needed for pain.  Patient does not want any narcotic/controlled pain medication at this time. She may try OTC Benadryl 25 to '50mg'$  at night to help with sleep.  Discussed that possible septic joint from dental infection especially since she has a history of prosthetic joints and is immunocompromised from RA and daily Prednisone use. May also may be a flare of Rheumatoid arthritis since she was well controlled on a TNF blocking agent but had to switch to a DMARD in the past 2 months due to finances. Patient has tried to contact her Orthopedic who has not responded yet today. Discussed that if joint is truly septic- she will need further evaluation and probable IV antibiotics. Recommend continue to contact her Orthopedic-- they have walk-in/Urgent Care hours tomorrow if she can not talk to her Orthopedic in the AM. If pain and swelling get worse in her knees, go to the ER ASAP. Otherwise, follow-up with her Orthopedic as discussed.   Final  Clinical Impressions(s) / UC Diagnoses   Final diagnoses:  Dental infection  Pain and swelling of right knee  Left lateral knee pain  History of bilateral knee replacement  History of rheumatoid arthritis     Discharge Instructions      Recommend start Augmentin '875mg'$  twice a day - take with food - as directed. May take OTC Benadryl 25 to 50 mg at night to help with sleep. Continue Prednisone and Tylenol as directed. Continue to monitor tooth pain and bilateral knee pain and swelling. Recommend continue to contact your Orthopedic tomorrow or go to their Urgent Care/After Hours tomorrow for further evaluation. You may need joint aspiration or additional tests. If unable to see your Orthopedic and swelling and pain continues, go to the ER.     ED Prescriptions     Medication Sig Dispense Auth. Provider   amoxicillin-clavulanate (AUGMENTIN) 875-125 MG tablet Take 1 tablet by mouth every 12 (twelve) hours for 7 days. 14 tablet Islam Eichinger, Nicholes Stairs, NP      PDMP not reviewed this encounter.   Katy Apo, NP 05/21/22 1045

## 2022-07-10 IMAGING — MG MM DIGITAL SCREENING BILAT W/ TOMO AND CAD
6 of 12 series · 6 of 36 positions shown · non-contrast
Comparison: Previous exam(s).

CLINICAL DATA: Screening.

EXAM:
DIGITAL SCREENING BILATERAL MAMMOGRAM WITH TOMOSYNTHESIS AND CAD
TECHNIQUE: Bilateral screening digital craniocaudal and mediolateral oblique
mammograms were obtained. Bilateral screening digital breast
tomosynthesis was performed. The images were evaluated with
computer-aided detection.

[L CC synth-2D (1 of 2)]
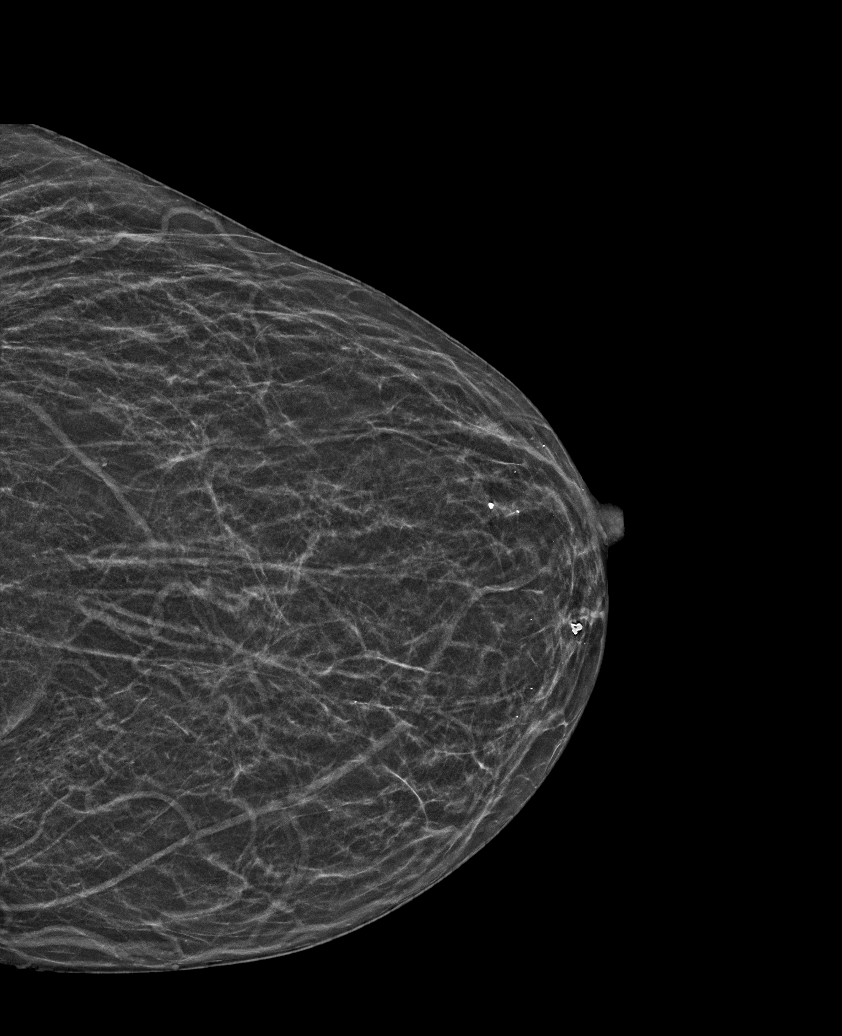

[R MLO synth-2D]
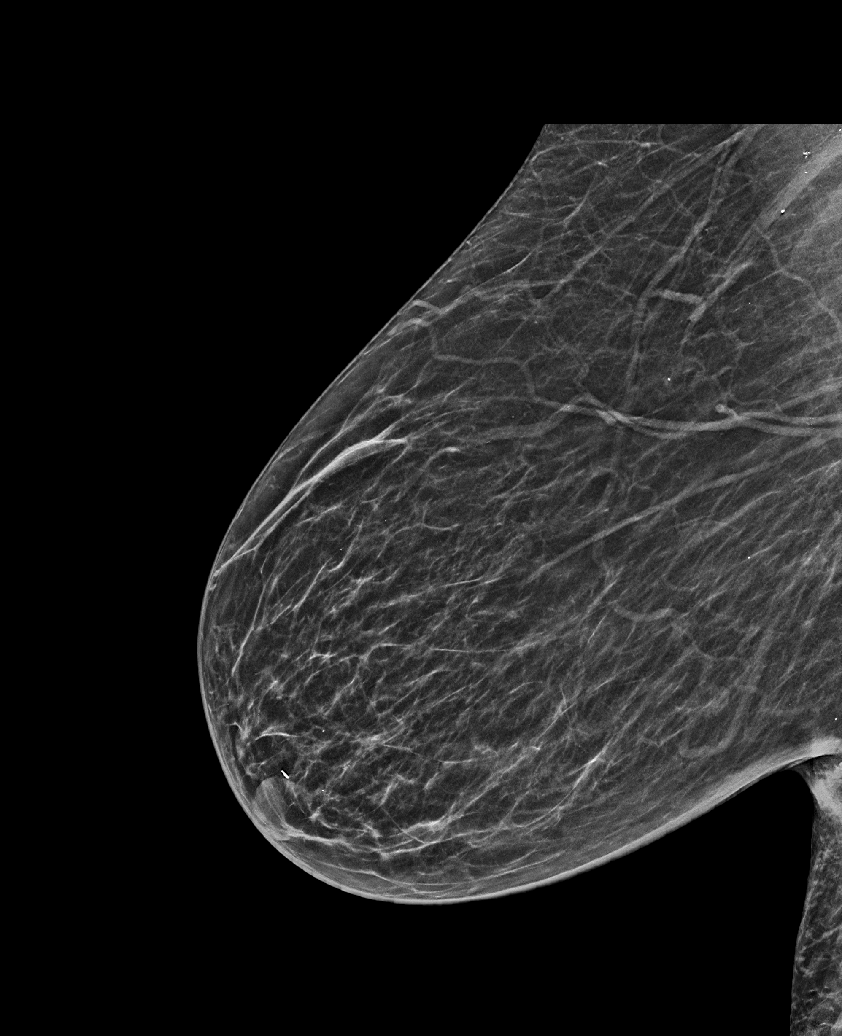

[R CC synth-2D (1 of 2)]
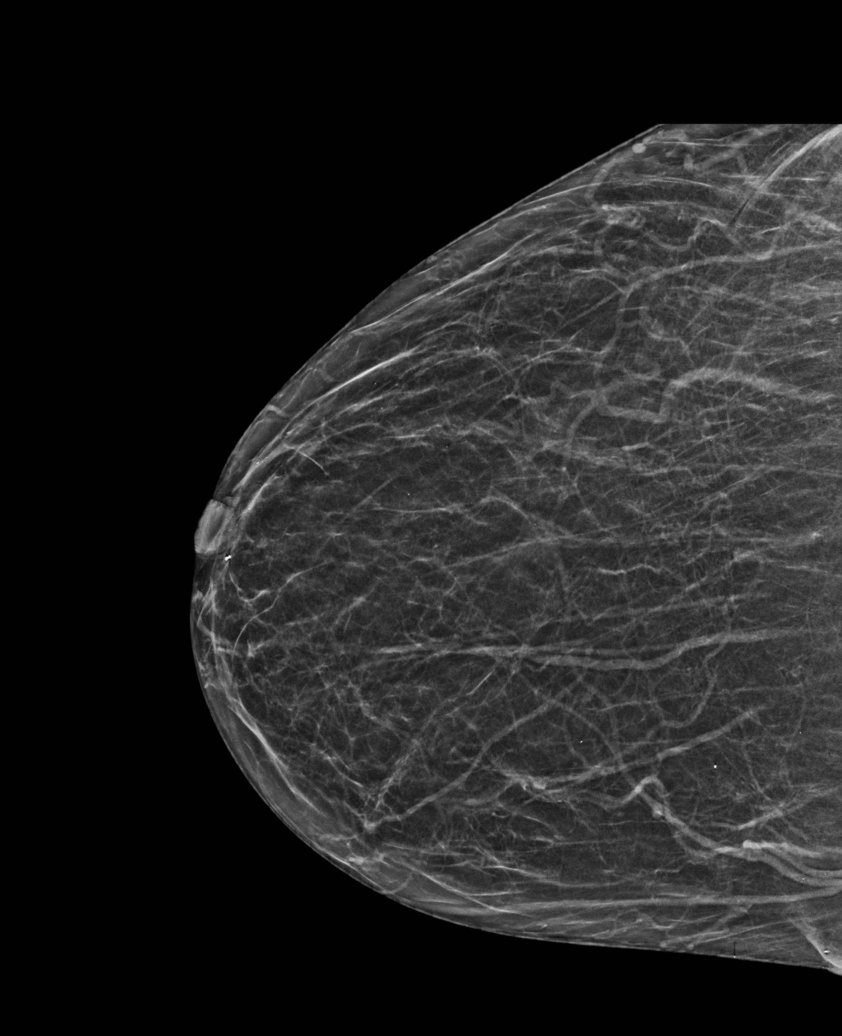

[L CC synth-2D (2 of 2)]
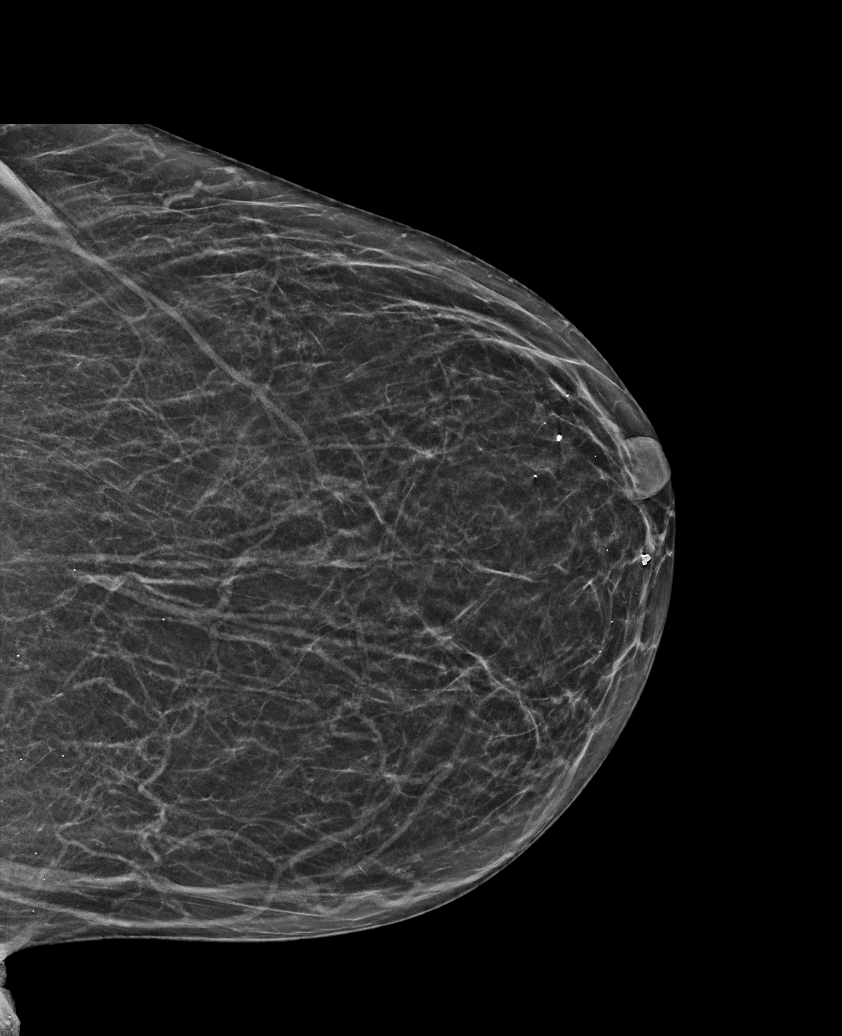

[R CC synth-2D (2 of 2)]
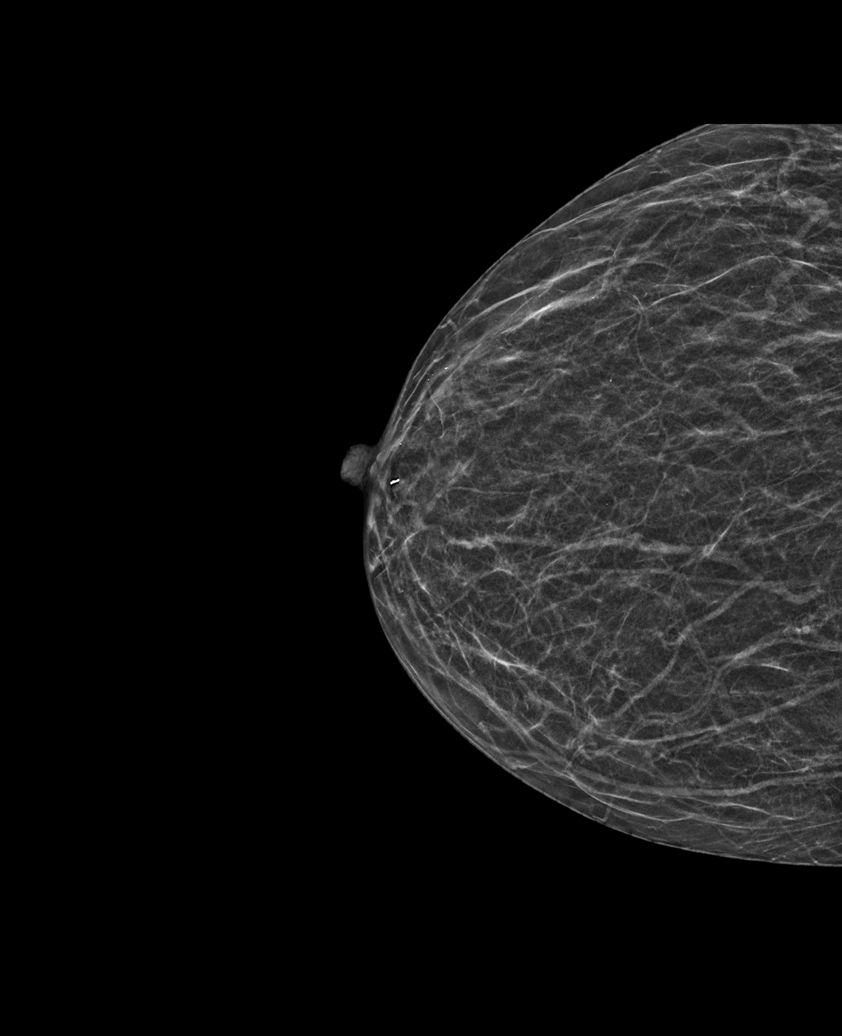

[L MLO synth-2D]
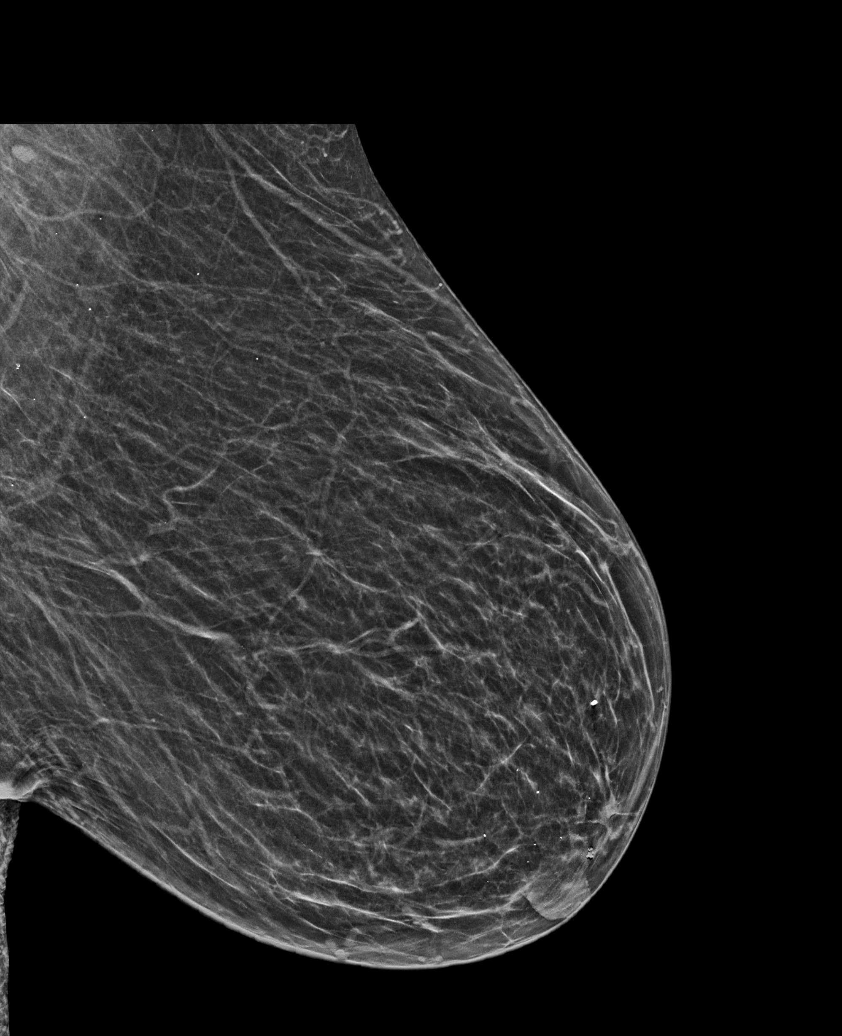

[6 of 36 positions shown; findings below may reference images not displayed]

ACR Breast Density Category b: There are scattered areas of
fibroglandular density.
FINDINGS: There are no findings suspicious for malignancy.
IMPRESSION: No mammographic evidence of malignancy. A result letter of this
screening mammogram will be mailed directly to the patient.

RECOMMENDATION:
Screening mammogram in one year. (Code:51-O-LD2)

BI-RADS CATEGORY  1: Negative.

## 2022-10-06 ENCOUNTER — Other Ambulatory Visit: Payer: Self-pay | Admitting: Internal Medicine

## 2022-10-06 DIAGNOSIS — Z1231 Encounter for screening mammogram for malignant neoplasm of breast: Secondary | ICD-10-CM

## 2022-11-24 ENCOUNTER — Ambulatory Visit: Payer: PRIVATE HEALTH INSURANCE

## 2022-12-30 ENCOUNTER — Ambulatory Visit
Admission: RE | Admit: 2022-12-30 | Discharge: 2022-12-30 | Disposition: A | Discharge status: Home / Self Care | Payer: Medicare HMO | Source: Ambulatory Visit | Attending: Internal Medicine | Admitting: Internal Medicine

## 2022-12-30 DIAGNOSIS — Z1231 Encounter for screening mammogram for malignant neoplasm of breast: Secondary | ICD-10-CM

## 2023-12-22 ENCOUNTER — Observation Stay (HOSPITAL_COMMUNITY)
Admission: EM | Admit: 2023-12-22 | Discharge: 2023-12-24 | Disposition: A | Discharge status: Home / Self Care | Attending: Internal Medicine | Admitting: Internal Medicine

## 2023-12-22 ENCOUNTER — Other Ambulatory Visit: Payer: Self-pay

## 2023-12-22 ENCOUNTER — Encounter (HOSPITAL_COMMUNITY): Payer: Self-pay | Admitting: Emergency Medicine

## 2023-12-22 DIAGNOSIS — I10 Essential (primary) hypertension: Secondary | ICD-10-CM | POA: Diagnosis present

## 2023-12-22 DIAGNOSIS — Z8711 Personal history of peptic ulcer disease: Secondary | ICD-10-CM | POA: Diagnosis not present

## 2023-12-22 DIAGNOSIS — M069 Rheumatoid arthritis, unspecified: Secondary | ICD-10-CM | POA: Diagnosis present

## 2023-12-22 DIAGNOSIS — K2100 Gastro-esophageal reflux disease with esophagitis, without bleeding: Secondary | ICD-10-CM | POA: Diagnosis not present

## 2023-12-22 DIAGNOSIS — Z9884 Bariatric surgery status: Secondary | ICD-10-CM | POA: Diagnosis not present

## 2023-12-22 DIAGNOSIS — Z862 Personal history of diseases of the blood and blood-forming organs and certain disorders involving the immune mechanism: Secondary | ICD-10-CM

## 2023-12-22 DIAGNOSIS — K279 Peptic ulcer, site unspecified, unspecified as acute or chronic, without hemorrhage or perforation: Secondary | ICD-10-CM | POA: Insufficient documentation

## 2023-12-22 DIAGNOSIS — K921 Melena: Secondary | ICD-10-CM | POA: Diagnosis present

## 2023-12-22 DIAGNOSIS — K922 Gastrointestinal hemorrhage, unspecified: Principal | ICD-10-CM | POA: Diagnosis present

## 2023-12-22 DIAGNOSIS — D649 Anemia, unspecified: Secondary | ICD-10-CM | POA: Insufficient documentation

## 2023-12-22 DIAGNOSIS — K219 Gastro-esophageal reflux disease without esophagitis: Secondary | ICD-10-CM | POA: Diagnosis not present

## 2023-12-22 DIAGNOSIS — Z87891 Personal history of nicotine dependence: Secondary | ICD-10-CM | POA: Insufficient documentation

## 2023-12-22 DIAGNOSIS — Z96653 Presence of artificial knee joint, bilateral: Secondary | ICD-10-CM | POA: Diagnosis not present

## 2023-12-22 LAB — PROTIME-INR
INR: 1.2 (ref 0.8–1.2)
Prothrombin Time: 14.9 s (ref 11.4–15.2)

## 2023-12-22 LAB — CBC
HCT: 24.5 % — ABNORMAL LOW (ref 36.0–46.0)
HCT: 22.5 % — ABNORMAL LOW (ref 36.0–46.0)
Hemoglobin: 7.1 g/dL — ABNORMAL LOW (ref 12.0–15.0)
Hemoglobin: 7 g/dL — ABNORMAL LOW (ref 12.0–15.0)
MCH: 23.7 pg — ABNORMAL LOW (ref 26.0–34.0)
MCH: 25.1 pg — ABNORMAL LOW (ref 26.0–34.0)
MCHC: 29 g/dL — ABNORMAL LOW (ref 30.0–36.0)
MCHC: 31.1 g/dL (ref 30.0–36.0)
MCV: 81.9 fL (ref 80.0–100.0)
MCV: 80.6 fL (ref 80.0–100.0)
Platelets: 347 10*3/uL (ref 150–400)
Platelets: 284 10*3/uL (ref 150–400)
RBC: 2.99 MIL/uL — ABNORMAL LOW (ref 3.87–5.11)
RBC: 2.79 MIL/uL — ABNORMAL LOW (ref 3.87–5.11)
RDW: 17.5 % — ABNORMAL HIGH (ref 11.5–15.5)
RDW: 17.2 % — ABNORMAL HIGH (ref 11.5–15.5)
WBC: 14 10*3/uL — ABNORMAL HIGH (ref 4.0–10.5)
WBC: 10.4 10*3/uL (ref 4.0–10.5)
nRBC: 0 % (ref 0.0–0.2)
nRBC: 0 % (ref 0.0–0.2)

## 2023-12-22 LAB — COMPREHENSIVE METABOLIC PANEL WITH GFR
ALT: 7 U/L (ref 0–44)
AST: 14 U/L — ABNORMAL LOW (ref 15–41)
Albumin: 2.6 g/dL — ABNORMAL LOW (ref 3.5–5.0)
Alkaline Phosphatase: 41 U/L (ref 38–126)
Anion gap: 13 (ref 5–15)
BUN: 23 mg/dL (ref 8–23)
CO2: 18 mmol/L — ABNORMAL LOW (ref 22–32)
Calcium: 8 mg/dL — ABNORMAL LOW (ref 8.9–10.3)
Chloride: 108 mmol/L (ref 98–111)
Creatinine, Ser: 0.78 mg/dL (ref 0.44–1.00)
GFR, Estimated: >60 mL/min (ref >60)
Glucose, Bld: 125 mg/dL — ABNORMAL HIGH (ref 70–99)
Potassium: 3.1 mmol/L — ABNORMAL LOW (ref 3.5–5.1)
Sodium: 139 mmol/L (ref 135–145)
Total Bilirubin: <0.2 mg/dL (ref 0.0–1.2)
Total Protein: 5.4 g/dL — ABNORMAL LOW (ref 6.5–8.1)

## 2023-12-22 LAB — HIV ANTIBODY (ROUTINE TESTING W REFLEX): HIV Screen 4th Generation wRfx: NONREACTIVE

## 2023-12-22 LAB — PREPARE RBC (CROSSMATCH)

## 2023-12-22 LAB — MAGNESIUM: Magnesium: 1.8 mg/dL (ref 1.7–2.4)

## 2023-12-22 MED ORDER — POTASSIUM CHLORIDE 10 MEQ/100ML IV SOLN
10.0000 meq | INTRAVENOUS | Status: AC
Start: 2023-12-22 — End: 2023-12-22
  Administered 2023-12-22 (×2): 10 meq via INTRAVENOUS
  Filled 2023-12-22 (×2): qty 100

## 2023-12-22 MED ORDER — POTASSIUM CHLORIDE 10 MEQ/100ML IV SOLN
10.0000 meq | INTRAVENOUS | Status: AC
  Administered 2023-12-22 (×2): 10 meq via INTRAVENOUS
  Filled 2023-12-22 (×2): qty 100

## 2023-12-22 MED ORDER — SODIUM CHLORIDE 0.9% IV SOLUTION: Freq: Once | INTRAVENOUS | Status: AC

## 2023-12-22 MED ORDER — POLYETHYLENE GLYCOL 3350 17 G PO PACK: 17.0000 g | PACK | Freq: Every day | ORAL | Status: DC | PRN

## 2023-12-22 MED ORDER — SULFASALAZINE 500 MG PO TBEC
1000.0000 mg | DELAYED_RELEASE_TABLET | Freq: Two times a day (BID) | ORAL | Status: DC
  Administered 2023-12-22 – 2023-12-24 (×4): 1000 mg via ORAL
  Filled 2023-12-22 (×4): qty 2

## 2023-12-22 MED ORDER — ACETAMINOPHEN 325 MG PO TABS
650.0000 mg | ORAL_TABLET | Freq: Four times a day (QID) | ORAL | Status: DC | PRN
  Administered 2023-12-23 – 2023-12-24 (×2): 650 mg via ORAL
  Filled 2023-12-22: qty 2

## 2023-12-22 MED ORDER — PANTOPRAZOLE SODIUM 40 MG IV SOLR
80.0000 mg | Freq: Once | INTRAVENOUS | Status: AC
  Administered 2023-12-22: 80 mg via INTRAVENOUS
  Filled 2023-12-22: qty 20

## 2023-12-22 MED ORDER — ACETAMINOPHEN 650 MG RE SUPP: 650.0000 mg | Freq: Four times a day (QID) | RECTAL | Status: DC | PRN

## 2023-12-22 MED ORDER — PANTOPRAZOLE SODIUM 40 MG IV SOLR
40.0000 mg | Freq: Two times a day (BID) | INTRAVENOUS | Status: DC
  Administered 2023-12-22 – 2023-12-23 (×3): 40 mg via INTRAVENOUS
  Filled 2023-12-22 (×3): qty 10

## 2023-12-22 MED ORDER — SODIUM CHLORIDE 0.9 % IV SOLN: INTRAVENOUS | Status: AC

## 2023-12-22 MED ORDER — SODIUM CHLORIDE 0.9 % IV BOLUS
500.0000 mL | Freq: Once | INTRAVENOUS | Status: AC
  Administered 2023-12-22: 500 mL via INTRAVENOUS

## 2023-12-22 NOTE — ED Triage Notes (Signed)
 Pt here from home with c/o blood in her stool this morning along with some sob , has needed blood transfusions in the past for low hgb

## 2023-12-22 NOTE — ED Provider Notes (Signed)
 [place] EMERGENCY DEPARTMENT AT [facility] Provider Note   CSN: [id number] Arrival date & time: 12/22/23  0945     History  Chief Complaint  Patient presents with   Shortness of Breath    Mackenzie Rivera is a 63 y.o. female.  HPI 63 year old female with a history of ulcers presents with concern for rectal bleeding.  She states that she has been having some abdominal pain for about 2 weeks and it made her stop eating as much because of the pain it causes.  She has been compliant with her Protonix.  She states that this morning she developed black stools and then on her next bowel movement there was bright red blood.  She ended up being lightheaded and short of breath while in the waiting room and nearly passed out.  Those symptoms have all resolved.  She does continue to have some upper abdominal discomfort and states she thinks it might also be exacerbated by being on prednisone in the last 2 weeks for her RA.  She denies any NSAID use.  Her gastroenterologist is Dr. Loreta Ave.  Home Medications Prior to Admission medications   Medication Sig Start Date End Date Taking? Authorizing Provider  acetaminophen (TYLENOL) 500 MG tablet Take 500 mg by mouth daily as needed for moderate pain (pain score 4-6) or headache.   Yes [provider]  ASHWAGANDHA PO Take 0.5 mLs by mouth at bedtime.   Yes [provider]  atenolol (TENORMIN) 50 MG tablet Take 50 mg by mouth at bedtime.   Yes [provider]  CHELATED IRON PO Take 1 tablet by mouth every other day.   Yes [provider]  Misc Natural Products (TURMERIC CURCUMIN) CAPS Take 2 capsules by mouth daily after breakfast.   Yes [provider]  OIL OF OREGANO PO Take 2 capsules by mouth daily.   Yes [provider]  OVER THE COUNTER MEDICATION Take 2 capsules by mouth daily after breakfast. OTC [place] Seamoss   Yes [provider]  pantoprazole (PROTONIX) 40 MG tablet Take 1 tablet  (40 mg total) by mouth 2 (two) times daily. Continue 1 tablet daily after 1 month. Patient taking differently: Take 40 mg by mouth daily before breakfast. Continue 1 tablet daily after 1 month. 07/06/20  Yes Burnadette Pop, MD  predniSONE (DELTASONE) 5 MG tablet Take 10-20 mg by mouth daily as needed (RA flares). 07/01/20  Yes [provider]      Allergies    Patient has no known allergies.    Review of Systems   Review of Systems  Respiratory:  Positive for shortness of breath.   Gastrointestinal:  Positive for abdominal pain and blood in stool.  Neurological:  Positive for light-headedness.    Physical Exam Updated Vital Signs BP 123/62   Pulse 76   Temp 98.1 F (36.7 C) (Oral)   Resp 19   SpO2 100%  Physical Exam Vitals and nursing note reviewed.  Constitutional:      General: She is not in acute distress.    Appearance: She is well-developed. She is not ill-appearing or diaphoretic.  HENT:     Head: Normocephalic and atraumatic.  Cardiovascular:     Rate and Rhythm: Normal rate and regular rhythm.     Heart sounds: Normal heart sounds.  Pulmonary:     Effort: Pulmonary effort is normal.     Breath sounds: Normal breath sounds.  Abdominal:     Palpations: Abdomen is soft.  Tenderness: There is no abdominal tenderness.  Skin:    General: Skin is warm and dry.  Neurological:     Mental Status: She is alert.     ED Results / Procedures / Treatments   Labs (all labs ordered are listed, but only abnormal results are displayed) Labs Reviewed  CBC - Abnormal; Notable for the following components:      Result Value   WBC 14.0 (*)    RBC 2.99 (*)    Hemoglobin 7.1 (*)    HCT 24.5 (*)    MCH 23.7 (*)    MCHC 29.0 (*)    RDW 17.5 (*)    All other components within normal limits  COMPREHENSIVE METABOLIC PANEL WITH GFR - Abnormal; Notable for the following components:   Potassium 3.1 (*)    CO2 18 (*)    Glucose, Bld 125 (*)    Calcium 8.0 (*)     Total Protein 5.4 (*)    Albumin 2.6 (*)    AST 14 (*)    All other components within normal limits  PROTIME-INR  HIV ANTIBODY (ROUTINE TESTING W REFLEX)  CBC  MAGNESIUM  TYPE AND SCREEN  PREPARE RBC (CROSSMATCH)    EKG EKG Interpretation Date/Time:  Wednesday December 22 2023 10:31:30 EDT Ventricular Rate:  73 PR Interval:  141 QRS Duration:  93 QT Interval:  360 QTC Calculation: 397 R Axis:   5  Text Interpretation: Sinus rhythm Nonspecific T abnormalities, lateral leads Confirmed by Pricilla Loveless [phone]) on 12/22/2023 10:49:52 AM  Radiology No results found.  Procedures .Critical Care  Performed by: Pricilla Loveless, MD Authorized by: Pricilla Loveless, MD   Critical care provider statement:    Critical care time (minutes):  30   Critical care time was exclusive of:  Separately billable procedures and treating other patients   Critical care was necessary to treat or prevent imminent or life-threatening deterioration of the following conditions:  Circulatory failure   Critical care was time spent personally by me on the following activities:  Development of treatment plan with patient or surrogate, discussions with consultants, evaluation of patient's response to treatment, examination of patient, ordering and review of laboratory studies, ordering and review of radiographic studies, ordering and performing treatments and interventions, pulse oximetry, re-evaluation of patient's condition and review of old charts     Medications Ordered in ED Medications  0.9 %  sodium chloride infusion (Manually program via Guardrails IV Fluids) (has no administration in time range)  sulfaSALAzine (AZULFIDINE) EC tablet 1,000 mg (has no administration in time range)  acetaminophen (TYLENOL) tablet 650 mg (has no administration in time range)    Or  acetaminophen (TYLENOL) suppository 650 mg (has no administration in time range)  polyethylene glycol (MIRALAX / GLYCOLAX) packet 17 g (has no  administration in time range)  potassium chloride 10 mEq in 100 mL IVPB (has no administration in time range)  0.9 %  sodium chloride infusion (has no administration in time range)  pantoprazole (PROTONIX) injection 80 mg (80 mg Intravenous Given 12/22/23 1044)  sodium chloride 0.9 % bolus 500 mL (0 mLs Intravenous Stopped 12/22/23 1413)    ED Course/ Medical Decision Making/ A&P                                 Medical Decision Making Amount and/or Complexity of Data Reviewed External Data Reviewed: notes. Labs: ordered.    Details: Hemoglobin  7.1, lower than baseline. ECG/medicine tests: ordered and independent interpretation performed.    Details: Nonspecific changes  Risk Prescription drug management. Decision regarding hospitalization.   Patient with recurrent anemia, likely from upper GI bleed.  Given IV Protonix, small bolus of fluids given transient hypotension, as well as started on a unit of blood.  Otherwise has remained stable.  I discussed with Dr. Loreta Ave who will see in consultation.  Discussed with Dr. Alinda Money, who will admit.  Patient has a benign abdominal exam so do not think CT imaging is currently needed.  Likely recurrent upper GI bleed given her history of prior bypass surgery.        Final Clinical Impression(s) / ED Diagnoses Final diagnoses:  Upper GI bleed  Symptomatic anemia    Rx / DC Orders ED Discharge Orders     None         Pricilla Loveless, MD 12/22/23 1428

## 2023-12-22 NOTE — H&P (View-Only) (Signed)
 Reason for Consult: Severe anemia with GI bleed. Referring Physician: ER MD  Mackenzie Rivera is an 63 y.o. female.  HPI: Ms. Mackenzie Rivera is a 63 year old black female with multiple medical problems with listed below who was in her usual state of health till about 2 weeks ago and started having some epigastric pain.  She claims she had been taking Prednisone 10 mg every other day for her joint pains along with Azulfidine. She did have some melenic stools this morning and then some bright red blood thereafter, prompting her to come to the emergency room as she felt weak and dizzy.  She has not had any further rectal bleeding in the ER since she has been here. She denies having a problem with syncope or near syncope. She denies use of other OTC nonsteroidals. She has a history of GERD and claims she has been taking her pantoprazole on a daily basis. She had a similar bleed in 2019 when a visible vessel was cauterized and clipped and a gastric ulcer was noted. 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 Her last colonoscopy was done in 2019 for an acute bleed thought to be secondary diverticulosis and at clot was seen over one of the diverticulum; scattered diverticulosis noted throughout the colon. Patient claims her oral intake has been poor since she started having abdominal pain.  She also had some constipation that resolved a couple of days ago.  Her hemoglobin admission was 7.1 g/dL with an MCV of 16.1 platelets were normal at 3 47K.  Her labs in epic reveal a hemoglobin of 8.4 g/dL and October 0960.  We are trying to procure her recent labs from her PCP, Dr. [name].  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    Hx of menorrhagia    Hypertension    Mastodynia    h/o   Morbid obesity (HCC)     Obesity    Osteoarthritis    Pelvic pain in female    h/o   Rheumatoid arthritis(714.0)    Yeast infection    h/o- on pap smear   Past Surgical History:  Procedure Laterality Date   BIOPSY  03/14/2020   Procedure: BIOPSY;  Surgeon: Charna Elizabeth, MD;  Location: [facility] ENDOSCOPY;  Service: Gastroenterology;;   COLONOSCOPY  08/29/2012   Procedure: COLONOSCOPY;  Surgeon: Charna Elizabeth, MD;  Location: [facility] ENDOSCOPY;  Service: Endoscopy;  Laterality: N/A;   COLONOSCOPY N/A 12/24/2017   Procedure: COLONOSCOPY;  Surgeon: Jeani Hawking, MD;  Location: [facility] ENDOSCOPY;  Service: Endoscopy;  Laterality: N/A;   ENDOMETRIAL ABLATION W/ NOVASURE  11/2007   ESOPHAGOGASTRODUODENOSCOPY N/A 12/24/2017   Procedure: ESOPHAGOGASTRODUODENOSCOPY (EGD);  Surgeon: Jeani Hawking, MD;  Location: [facility] ENDOSCOPY;  Service: Endoscopy;  Laterality: N/A;   ESOPHAGOGASTRODUODENOSCOPY (EGD) WITH PROPOFOL N/A 03/14/2020   Procedure: ESOPHAGOGASTRODUODENOSCOPY (EGD) WITH PROPOFOL;  Surgeon: Charna Elizabeth, MD;  Location: [facility] ENDOSCOPY;  Service: Gastroenterology;  Laterality: N/A;   ESOPHAGOGASTRODUODENOSCOPY (EGD) WITH PROPOFOL N/A 03/19/2020   Procedure: ESOPHAGOGASTRODUODENOSCOPY (EGD) WITH PROPOFOL;  Surgeon: Charna Elizabeth, MD;  Location: [facility] ENDOSCOPY;  Service: Endoscopy;  Laterality: N/A;   ESOPHAGOGASTRODUODENOSCOPY (EGD) WITH PROPOFOL N/A 07/05/2020   Procedure: ESOPHAGOGASTRODUODENOSCOPY (EGD) WITH PROPOFOL;  Surgeon: Jeani Hawking, MD;  Location: [facility] ENDOSCOPY;  Service: Endoscopy;  Laterality: N/A;   GASTRIC BYPASS  02/2003   HEMOSTASIS CLIP PLACEMENT  03/19/2020   Procedure: HEMOSTASIS CLIP PLACEMENT;  Surgeon: Charna Elizabeth, MD;  Location: [facility] ENDOSCOPY;  Service: Endoscopy;;   HEMOSTASIS CLIP PLACEMENT  07/05/2020   Procedure: HEMOSTASIS CLIP PLACEMENT;  Surgeon: Jeani Hawking, MD;  Location: [facility] ENDOSCOPY;  Service: Endoscopy;;   HOT HEMOSTASIS N/A 07/05/2020   Procedure: HOT HEMOSTASIS (ARGON PLASMA COAGULATION/BICAP);  Surgeon: Jeani Hawking, MD;  Location: [facility] ENDOSCOPY;  Service: Endoscopy;  Laterality: N/A;   KNEE ARTHROSCOPY  07/2006   Bilateral   SCLEROTHERAPY  03/19/2020   Procedure: SCLEROTHERAPY;  Surgeon: Charna Elizabeth, MD;  Location: [facility] ENDOSCOPY;  Service: Endoscopy;;   SUBMUCOSAL INJECTION  07/05/2020   Procedure: SUBMUCOSAL INJECTION;  Surgeon: Jeani Hawking, MD;  Location: [facility] ENDOSCOPY;  Service: Endoscopy;;   TOTAL KNEE ARTHROPLASTY  08/2007   Left   TOTAL KNEE ARTHROPLASTY  07/2007   Right   Family History  Problem Relation Age of Onset   Kidney disease Father    Diabetes Maternal Uncle    Diabetes Paternal Uncle    Colon cancer Neg Hx    Breast cancer Neg Hx    Social History:  reports that she quit smoking about 14 years ago. Her smoking use included cigarettes. She has never used smokeless tobacco. She reports current alcohol use. She reports that she does not use drugs.  Allergies: No Known Allergies  Medications: I have reviewed the patient's current medications. Prior to Admission: (Not in a hospital admission)  Scheduled:  sodium chloride   Intravenous Once   sulfaSALAzine  1,000 mg Oral BID   Continuous:  sodium chloride     potassium chloride     NWG:[id number] **OR** acetaminophen, polyethylene glycol  Results for orders placed or performed during the hospital encounter of 12/22/23 (from the past 48 hours)  Type and screen [facility]     Status: None (Preliminary result)   Collection Time: 12/22/23 10:30 AM  Result Value Ref Range   ABO/RH(D) A POS    Antibody Screen NEG    Sample Expiration      12/25/2023,2359 Performed at [facility], [address] [place], [place] [address]    Unit Number [id number]    Blood Component Type RED CELLS,LR    Unit division 00    Status of Unit ALLOCATED    Transfusion Status OK TO TRANSFUSE    Crossmatch Result Compatible   Prepare RBC (crossmatch)     Status: None   Collection Time: 12/22/23 10:30 AM   Result Value Ref Range   Order Confirmation      ORDER PROCESSED BY BLOOD BANK Performed at [facility], [address] [place], [place] [address]   CBC     Status: Abnormal   Collection Time: 12/22/23 10:32 AM  Result Value Ref Range   WBC 14.0 (H) 4.0 - 10.5 K/uL   RBC 2.99 (L) 3.87 - 5.11 MIL/uL   Hemoglobin 7.1 (L) 12.0 - 15.0 g/dL   HCT 64.4 (L) 03.4 - 74.2 %   MCV 81.9 80.0 - 100.0 fL   MCH 23.7 (L) 26.0 - 34.0 pg   MCHC 29.0 (L) 30.0 - 36.0 g/dL   RDW 59.5 (H) 63.8 - 75.6 %   Platelets 347 150 - 400 K/uL   nRBC 0.0 0.0 - 0.2 %    Comment: Performed at [facility], [address] [place], [place] [address]  Comprehensive metabolic panel     Status: Abnormal   Collection Time: 12/22/23 10:32 AM  Result Value Ref Range   Sodium 139 135 - 145 mmol/L   Potassium 3.1 (L) 3.5 - 5.1 mmol/L   Chloride 108 98 - 111 mmol/L   CO2 18 (L) 22 - 32 mmol/L   Glucose, Bld 125 (H) 70 - 99 mg/dL    Comment: Glucose reference range applies only to samples taken after fasting for at least 8 hours.   BUN 23 8 - 23 mg/dL   Creatinine, Ser 0.27 0.44 - 1.00 mg/dL   Calcium 8.0 (L) 8.9 - 10.3 mg/dL   Total Protein 5.4 (L) 6.5 - 8.1 g/dL   Albumin 2.6 (L) 3.5 - 5.0 g/dL   AST 14 (L) 15 - 41 U/L   ALT 7 0 - 44 U/L   Alkaline Phosphatase 41 38 - 126 U/L   Total Bilirubin <0.2 0.0 - 1.2 mg/dL   GFR, Estimated >25 >36 mL/min    Comment: (NOTE) Calculated using the CKD-EPI Creatinine Equation (2021)    Anion gap 13 5 - 15    Comment: Performed at [facility], [address], [place], [place] [address]   Review of Systems  Constitutional:  Positive for activity change, appetite change and fatigue. Negative for chills, diaphoresis, fever and unexpected weight change.  HENT: Negative.    Eyes: Negative.   Respiratory: Negative.    Gastrointestinal:  Positive for abdominal pain, anal bleeding, blood in stool and constipation.  Endocrine: Negative.   Genitourinary:  Negative.   Musculoskeletal:  Positive for arthralgias.  Allergic/Immunologic: Negative for environmental allergies.  Neurological:  Positive for dizziness, weakness, light-headedness and headaches. Negative for tremors, seizures, syncope, facial asymmetry, speech difficulty and numbness.  Hematological: Negative.   Psychiatric/Behavioral: Negative.     Blood pressure (!) 102/55, pulse 65, temperature 98 F (36.7 C), temperature source Oral, resp. rate 19, SpO2 100%. Physical Exam Constitutional:      General: She is not in acute distress.    Appearance: She is well-developed. She is obese. She is not ill-appearing.  HENT:     Head: Normocephalic and atraumatic.  Eyes:     Extraocular Movements: Extraocular movements intact.     Pupils: Pupils are equal, round, and reactive to light.  Cardiovascular:     Rate and Rhythm: Normal rate and regular rhythm.  Pulmonary:     Effort: Pulmonary effort is normal.     Breath sounds: Normal breath sounds.  Musculoskeletal:        General: Normal range of motion.  Skin:    General: Skin is warm and dry.  Neurological:     General: No focal deficit present.     Mental Status: She is alert and oriented to person, place, and time.  Psychiatric:        Mood and Affect: Mood normal.        Behavior: Behavior normal.   Assessment/Plan: 1) Acute posthemorrhagic anemia with melena/bright red bleeding per rectum-patient has been transfused 1 unit of packed red blood cells. Continue IV PPIs-EGD is planned for the patient tomorrow BUN is normal.  Had a history of a diverticular bleed in the past and therefore not sure if it is a right-sided colonic lesion or her upper GI bleed.  If the EGD is unrevealing a colonoscopy will be done. 2) GERD-on PPI's. 3) Rheumatoid arthritis/osteoarthritis-on sulfasalazine 4) Morbid obesity s/p gastric Roux-en-Y  bypass surgery.  5) Hypertension. Charna Elizabeth 12/22/2023, 1:24 PM

## 2023-12-22 NOTE — H&P (Signed)
 History and Physical   Mackenzie Rivera ZOX:[id number] DOB: [birth year] DOA: 12/22/2023  PCP: [name], MD   Patient coming from: Home  Chief Complaint: Rectal bleeding, abdominal pain  HPI: Mackenzie Rivera is a 62 y.o. female with medical history significant of hypertension, GERD, PUD, GI bleed, anemia, RA, obesity, gastric bypass presenting with abdominal pain and rectal bleeding.  Patient reports about 2 weeks of abdominal pain.  She reports decreased p.o. intake secondary to this.  She has continued to take her home PPI.  Noted some black stool this morning and later in the day began to have bright red blood per rectum.  No further BMs reported. Unclear Baseline Hgb.  Had episode of lightheadedness and near syncope in triage in the ED but has been doing better following that.  Has been on increased steroids for the past couple weeks for her rheumatoid arthritis as well.  Denies fevers, chills, chest pain, shortness of breath, constipation, diarrhea, nausea, vomiting.  ED Course: Vital signs in the ED notable for blood pressure in the 100 systolic, did have brief episode of blood pressure in the 70s-80s during that presyncopal/near syncopal event.  Lab workup included CMP with potassium 3.1, bicarb 18, glucose 125, calcium 8.0, protein 5.4, albumin 2.6.  CBC with hemoglobin 7.1 from unknown baseline.  Leukocytosis of 14 in the setting of steroids.  PT and INR pending.  Patient typed and screened in the ED.  Patient ordered IV PPI, 500 cc IV fluids, 1 unit of blood in the ED.  GI consulted and will see the patient.  It appears patient's primary GI provider is on-call today.  Review of Systems: As per HPI otherwise all other systems reviewed and are negative.  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     Hx of menorrhagia    Hypertension    Mastodynia    h/o   Morbid obesity (HCC)    Obesity    Osteoarthritis    Pelvic pain in female    h/o   Rheumatoid arthritis(714.0)    Yeast infection    h/o- on pap smear    Past Surgical History:  Procedure Laterality Date   BIOPSY  03/14/2020   Procedure: BIOPSY;  Surgeon: Charna Elizabeth, MD;  Location: [facility] ENDOSCOPY;  Service: Gastroenterology;;   COLONOSCOPY  08/29/2012   Procedure: COLONOSCOPY;  Surgeon: Charna Elizabeth, MD;  Location: [facility] ENDOSCOPY;  Service: Endoscopy;  Laterality: N/A;   COLONOSCOPY N/A 12/24/2017   Procedure: COLONOSCOPY;  Surgeon: Jeani Hawking, MD;  Location: [facility] ENDOSCOPY;  Service: Endoscopy;  Laterality: N/A;   ENDOMETRIAL ABLATION W/ NOVASURE  11/2007   ESOPHAGOGASTRODUODENOSCOPY N/A 12/24/2017   Procedure: ESOPHAGOGASTRODUODENOSCOPY (EGD);  Surgeon: Jeani Hawking, MD;  Location: [facility] ENDOSCOPY;  Service: Endoscopy;  Laterality: N/A;   ESOPHAGOGASTRODUODENOSCOPY (EGD) WITH PROPOFOL N/A 03/14/2020   Procedure: ESOPHAGOGASTRODUODENOSCOPY (EGD) WITH PROPOFOL;  Surgeon: Charna Elizabeth, MD;  Location: [facility] ENDOSCOPY;  Service: Gastroenterology;  Laterality: N/A;   ESOPHAGOGASTRODUODENOSCOPY (EGD) WITH PROPOFOL N/A 03/19/2020   Procedure: ESOPHAGOGASTRODUODENOSCOPY (EGD) WITH PROPOFOL;  Surgeon: Charna Elizabeth, MD;  Location: [facility] ENDOSCOPY;  Service: Endoscopy;  Laterality: N/A;   ESOPHAGOGASTRODUODENOSCOPY (EGD) WITH PROPOFOL N/A 07/05/2020   Procedure: ESOPHAGOGASTRODUODENOSCOPY (EGD) WITH PROPOFOL;  Surgeon: Elnoria Howard,  Luisa Hart, MD;  Location: [facility] ENDOSCOPY;  Service: Endoscopy;  Laterality: N/A;   GASTRIC BYPASS  02/2003   HEMOSTASIS CLIP PLACEMENT  03/19/2020   Procedure: HEMOSTASIS CLIP PLACEMENT;  Surgeon: Charna Elizabeth, MD;  Location: [facility] ENDOSCOPY;  Service: Endoscopy;;   HEMOSTASIS CLIP PLACEMENT  07/05/2020   Procedure: HEMOSTASIS CLIP PLACEMENT;  Surgeon: Jeani Hawking, MD;  Location: [facility] ENDOSCOPY;  Service: Endoscopy;;   HOT HEMOSTASIS N/A  07/05/2020   Procedure: HOT HEMOSTASIS (ARGON PLASMA COAGULATION/BICAP);  Surgeon: Jeani Hawking, MD;  Location: [facility] ENDOSCOPY;  Service: Endoscopy;  Laterality: N/A;   KNEE ARTHROSCOPY  07/2006   Bilateral   SCLEROTHERAPY  03/19/2020   Procedure: SCLEROTHERAPY;  Surgeon: Charna Elizabeth, MD;  Location: [facility] ENDOSCOPY;  Service: Endoscopy;;   SUBMUCOSAL INJECTION  07/05/2020   Procedure: SUBMUCOSAL INJECTION;  Surgeon: Jeani Hawking, MD;  Location: [facility] ENDOSCOPY;  Service: Endoscopy;;   TOTAL KNEE ARTHROPLASTY  08/2007   Left   TOTAL KNEE ARTHROPLASTY  07/2007   Right    Social History  reports that she quit smoking about 14 years ago. Her smoking use included cigarettes. She has never used smokeless tobacco. She reports current alcohol use. She reports that she does not use drugs.  No Known Allergies  Family History  Problem Relation Age of Onset   Kidney disease Father    Diabetes Maternal Uncle    Diabetes Paternal Uncle    Colon cancer Neg Hx    Breast cancer Neg Hx   Reviewed on admission  Prior to Admission medications   Medication Sig Start Date End Date Taking? Authorizing Provider  atenolol (TENORMIN) 50 MG tablet Take 50 mg by mouth daily.    [provider]  calcium citrate (CALCITRATE - DOSED IN MG ELEMENTAL CALCIUM) 950 MG tablet Take 1 tablet by mouth daily.      [provider]  pantoprazole (PROTONIX) 40 MG tablet Take 1 tablet (40 mg total) by mouth 2 (two) times daily. Continue 1 tablet daily after 1 month. 07/06/20   Burnadette Pop, MD  predniSONE (DELTASONE) 5 MG tablet Take 5 mg by mouth in the morning and at bedtime. 07/01/20   [provider]  sucralfate (CARAFATE) 1 g tablet Take 1 tablet (1 g total) by mouth 3 (three) times daily as needed (ulcer). 07/06/20   Burnadette Pop, MD  sulfaSALAzine (AZULFIDINE) 500 MG EC tablet Take 1,000 mg by mouth 2 (two) times daily.    [provider]  vitamin B-12 (CYANOCOBALAMIN) 100 MCG  tablet Take 50 mcg by mouth daily.      [provider]    Physical Exam: Vitals:   12/22/23 1028 12/22/23 1032 12/22/23 1115  BP: (!) 78/52 107/65 (!) 102/55  Pulse: 75  65  Resp: 18  19  Temp: 98 F (36.7 C)    TempSrc: Oral    SpO2: 100%  100%    Physical Exam Constitutional:      General: She is not in acute distress.    Appearance: Normal appearance. She is obese.  HENT:     Head: Normocephalic and atraumatic.     Mouth/Throat:     Mouth: Mucous membranes are moist.     Pharynx: Oropharynx is clear.  Eyes:     Extraocular Movements: Extraocular movements intact.     Pupils: Pupils are equal, round, and reactive to light.  Cardiovascular:     Rate and Rhythm: Normal rate and regular rhythm.     Pulses: Normal pulses.  Heart sounds: Normal heart sounds.  Pulmonary:     Effort: Pulmonary effort is normal. No respiratory distress.     Breath sounds: Normal breath sounds.  Abdominal:     General: Bowel sounds are normal. There is no distension.     Palpations: Abdomen is soft.     Tenderness: There is no abdominal tenderness.  Musculoskeletal:        General: No swelling or deformity.  Skin:    General: Skin is warm and dry.  Neurological:     General: No focal deficit present.     Mental Status: Mental status is at baseline.    Labs on Admission: I have personally reviewed following labs and imaging studies  CBC: Recent Labs  Lab 12/22/23 1032  WBC 14.0*  HGB 7.1*  HCT 24.5*  MCV 81.9  PLT 347    Basic Metabolic Panel: Recent Labs  Lab 12/22/23 1032  NA 139  K 3.1*  CL 108  CO2 18*  GLUCOSE 125*  BUN 23  CREATININE 0.78  CALCIUM 8.0*    GFR: CrCl cannot be calculated (Unknown ideal weight.).  Liver Function Tests: Recent Labs  Lab 12/22/23 1032  AST 14*  ALT 7  ALKPHOS 41  BILITOT <0.2  PROT 5.4*  ALBUMIN 2.6*    Urine analysis:    Component Value Date/Time   COLORURINE AMBER BIOCHEMICALS MAY BE AFFECTED BY COLOR  (A) 09/06/2007 0905   APPEARANCEUR CLOUDY (A) 09/06/2007 0905   LABSPEC 1.026 09/06/2007 0905   PHURINE 5.0 09/06/2007 0905   GLUCOSEU NEGATIVE 09/06/2007 0905   HGBUR SMALL (A) 09/06/2007 0905   BILIRUBINUR NEGATIVE 09/06/2007 0905   KETONESUR NEGATIVE 09/06/2007 0905   PROTEINUR NEGATIVE 09/06/2007 0905   UROBILINOGEN 0.2 09/06/2007 0905   NITRITE NEGATIVE 09/06/2007 0905   LEUKOCYTESUR NEGATIVE 09/06/2007 0905    Radiological Exams on Admission: No results found.  EKG: Independently reviewed.  Sinus rhythm at 73 bpm.  Nonspecific T wave flattening.  Assessment/Plan Principal Problem:   GI bleed Active Problems:   Hypertension   Rheumatoid arthritis (HCC)   Morbid obesity (HCC)   History of anemia   GERD (gastroesophageal reflux disease)   History of Roux-en-Y gastric bypass   PUD (peptic ulcer disease)   GI bleed  Anemia History of GI bleed, PUD, GERD History of Roux-en-Y gastric bypass > Presenting with 2 weeks of abdominal pain and decreased p.o. intake.  With black stools followed by bloody stools this morning. > History of prior GI bleed secondary to ulcerative disease in the setting of history of Roux-en-Y gastric bypass. > Hemoglobin noted to be 7.1 in the ED, 1 unit ordered for transfusion. > GI consulted in the ED and will see the patient, will it appears that will likely be patient's primary GI provider Dr. Loreta Ave. -Monitor on telemetry overnight - Appreciate GI recommendations and assistance - Sips - Continue IV PPI - Continue IV fluids - Continue with 1 unit PRBC transfusion - Trend CBC - Supportive care  Near-syncope > Episode occurred in the ED.  Transient hypotension which has since improved and not recurred.  Likely related to GI bleeding above.  Hold off on further workup at this time. - Continue to monitor  RA - Continue home sulfasalazine - Hold Prednisone  Hypertension - Holding home atenolol in the setting of low normal blood  pressure  Obesity - Noted   DVT prophylaxis: Lovenox Code Status:   Full Family Communication:  None on admission  Disposition Plan:  Patient is from:  Home  Anticipated DC to:  Home  Anticipated DC date:  1 to 2 days  Anticipated DC barriers: None  Consults called:  Gastroenterology Admission status:  Observation, telemetry  Severity of Illness: The appropriate patient status for this patient is OBSERVATION. Observation status is judged to be reasonable and necessary in order to provide the required intensity of service to ensure the patient's safety. The patient's presenting symptoms, physical exam findings, and initial radiographic and laboratory data in the context of their medical condition is felt to place them at decreased risk for further clinical deterioration. Furthermore, it is anticipated that the patient will be medically stable for discharge from the hospital within 2 midnights of admission.    Synetta Fail MD [facility]  How to contact the [facility] Attending or Consulting provider 7A - 7P or covering provider during after hours 7P -7A, for this patient?   Check the care team in [facility] and look for a) attending/consulting [facility] provider listed and b) the [facility] team listed Log into [email or web address] and use [place]'s universal password to access. If you do not have the password, please contact the hospital operator. Locate the [facility] provider you are looking for under [facility] and page to a number that you can be directly reached. If you still have difficulty reaching the provider, please page the [facility] (Director on Call) for the Hospitalists listed on amion for assistance.  12/22/2023, 12:47 PM

## 2023-12-22 NOTE — Plan of Care (Signed)

## 2023-12-22 NOTE — Consult Note (Signed)
 Reason for Consult: Severe anemia with GI bleed. Referring Physician: ER MD  Mackenzie Rivera is an 63 y.o. female.  HPI: Ms. Mackenzie Rivera is a 63 year old black female with multiple medical problems with listed below who was in her usual state of health till about 2 weeks ago and started having some epigastric pain.  She claims she had been taking Prednisone 10 mg every other day for her joint pains along with Azulfidine. She did have some melenic stools this morning and then some bright red blood thereafter, prompting her to come to the emergency room as she felt weak and dizzy.  She has not had any further rectal bleeding in the ER since she has been here. She denies having a problem with syncope or near syncope. She denies use of other OTC nonsteroidals. She has a history of GERD and claims she has been taking her pantoprazole on a daily basis. She had a similar bleed in 2019 when a visible vessel was cauterized and clipped and a gastric ulcer was noted. 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 Her last colonoscopy was done in 2019 for an acute bleed thought to be secondary diverticulosis and at clot was seen over one of the diverticulum; scattered diverticulosis noted throughout the colon. Patient claims her oral intake has been poor since she started having abdominal pain.  She also had some constipation that resolved a couple of days ago.  Her hemoglobin admission was 7.1 g/dL with an MCV of 16.1 platelets were normal at 3 47K.  Her labs in epic reveal a hemoglobin of 8.4 g/dL and October 0960.  We are trying to procure her recent labs from her PCP, Dr. Andi Rivera.  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    Hx of menorrhagia    Hypertension    Mastodynia    h/o   Morbid obesity (HCC)     Obesity    Osteoarthritis    Pelvic pain in female    h/o   Rheumatoid arthritis(714.0)    Yeast infection    h/o- on pap smear   Past Surgical History:  Procedure Laterality Date   BIOPSY  03/14/2020   Procedure: BIOPSY;  Surgeon: Charna Elizabeth, MD;  Location: [facility] ENDOSCOPY;  Service: Gastroenterology;;   COLONOSCOPY  08/29/2012   Procedure: COLONOSCOPY;  Surgeon: Charna Elizabeth, MD;  Location: [facility] ENDOSCOPY;  Service: Endoscopy;  Laterality: N/A;   COLONOSCOPY N/A 12/24/2017   Procedure: COLONOSCOPY;  Surgeon: Jeani Hawking, MD;  Location: [facility] ENDOSCOPY;  Service: Endoscopy;  Laterality: N/A;   ENDOMETRIAL ABLATION W/ NOVASURE  11/2007   ESOPHAGOGASTRODUODENOSCOPY N/A 12/24/2017   Procedure: ESOPHAGOGASTRODUODENOSCOPY (EGD);  Surgeon: Jeani Hawking, MD;  Location: [facility] ENDOSCOPY;  Service: Endoscopy;  Laterality: N/A;   ESOPHAGOGASTRODUODENOSCOPY (EGD) WITH PROPOFOL N/A 03/14/2020   Procedure: ESOPHAGOGASTRODUODENOSCOPY (EGD) WITH PROPOFOL;  Surgeon: Charna Elizabeth, MD;  Location: [facility] ENDOSCOPY;  Service: Gastroenterology;  Laterality: N/A;   ESOPHAGOGASTRODUODENOSCOPY (EGD) WITH PROPOFOL N/A 03/19/2020   Procedure: ESOPHAGOGASTRODUODENOSCOPY (EGD) WITH PROPOFOL;  Surgeon: Charna Elizabeth, MD;  Location: [facility] ENDOSCOPY;  Service: Endoscopy;  Laterality: N/A;   ESOPHAGOGASTRODUODENOSCOPY (EGD) WITH PROPOFOL N/A 07/05/2020   Procedure: ESOPHAGOGASTRODUODENOSCOPY (EGD) WITH PROPOFOL;  Surgeon: Jeani Hawking, MD;  Location: [facility] ENDOSCOPY;  Service: Endoscopy;  Laterality: N/A;   GASTRIC BYPASS  02/2003   HEMOSTASIS CLIP PLACEMENT  03/19/2020   Procedure: HEMOSTASIS CLIP PLACEMENT;  Surgeon: Charna Elizabeth, MD;  Location: [facility] ENDOSCOPY;  Service: Endoscopy;;   HEMOSTASIS CLIP PLACEMENT  07/05/2020   Procedure: HEMOSTASIS CLIP PLACEMENT;  Surgeon: Jeani Hawking, MD;  Location: [facility] ENDOSCOPY;  Service: Endoscopy;;   HOT HEMOSTASIS N/A 07/05/2020   Procedure: HOT HEMOSTASIS (ARGON PLASMA COAGULATION/BICAP);  Surgeon: Jeani Hawking, MD;  Location: [facility] ENDOSCOPY;  Service: Endoscopy;  Laterality: N/A;   KNEE ARTHROSCOPY  07/2006   Bilateral   SCLEROTHERAPY  03/19/2020   Procedure: SCLEROTHERAPY;  Surgeon: Charna Elizabeth, MD;  Location: [facility] ENDOSCOPY;  Service: Endoscopy;;   SUBMUCOSAL INJECTION  07/05/2020   Procedure: SUBMUCOSAL INJECTION;  Surgeon: Jeani Hawking, MD;  Location: [facility] ENDOSCOPY;  Service: Endoscopy;;   TOTAL KNEE ARTHROPLASTY  08/2007   Left   TOTAL KNEE ARTHROPLASTY  07/2007   Right   Family History  Problem Relation Age of Onset   Kidney disease Father    Diabetes Maternal Uncle    Diabetes Paternal Uncle    Colon cancer Neg Hx    Breast cancer Neg Hx    Social History:  reports that she quit smoking about 14 years ago. Her smoking use included cigarettes. She has never used smokeless tobacco. She reports current alcohol use. She reports that she does not use drugs.  Allergies: No Known Allergies  Medications: I have reviewed the patient's current medications. Prior to Admission: (Not in a hospital admission)  Scheduled:  sodium chloride   Intravenous Once   sulfaSALAzine  1,000 mg Oral BID   Continuous:  sodium chloride     potassium chloride     NWG:NFAOZHYQMVHQI **OR** acetaminophen, polyethylene glycol  Results for orders placed or performed during the hospital encounter of 12/22/23 (from the past 48 hours)  Type and screen [facility]     Status: None (Preliminary result)   Collection Time: 12/22/23 10:30 AM  Result Value Ref Range   ABO/RH(D) A POS    Antibody Screen NEG    Sample Expiration      12/25/2023,2359 Performed at [facility], [address] [place], [place] [address]    Unit Number [id number]    Blood Component Type RED CELLS,LR    Unit division 00    Status of Unit ALLOCATED    Transfusion Status OK TO TRANSFUSE    Crossmatch Result Compatible   Prepare RBC (crossmatch)     Status: None   Collection Time: 12/22/23 10:30 AM   Result Value Ref Range   Order Confirmation      ORDER PROCESSED BY BLOOD BANK Performed at [facility], [address] [place], [place] [address]   CBC     Status: Abnormal   Collection Time: 12/22/23 10:32 AM  Result Value Ref Range   WBC 14.0 (H) 4.0 - 10.5 K/uL   RBC 2.99 (L) 3.87 - 5.11 MIL/uL   Hemoglobin 7.1 (L) 12.0 - 15.0 g/dL   HCT 64.4 (L) 03.4 - 74.2 %   MCV 81.9 80.0 - 100.0 fL   MCH 23.7 (L) 26.0 - 34.0 pg   MCHC 29.0 (L) 30.0 - 36.0 g/dL   RDW 59.5 (H) 63.8 - 75.6 %   Platelets 347 150 - 400 K/uL   nRBC 0.0 0.0 - 0.2 %    Comment: Performed at [facility], [address] [place], [place] [address]  Comprehensive metabolic panel     Status: Abnormal   Collection Time: 12/22/23 10:32 AM  Result Value Ref Range   Sodium 139 135 - 145 mmol/L   Potassium 3.1 (L) 3.5 - 5.1 mmol/L   Chloride 108 98 - 111 mmol/L   CO2 18 (L) 22 - 32 mmol/L   Glucose, Bld 125 (H) 70 - 99 mg/dL    Comment: Glucose reference range applies only to samples taken after fasting for at least 8 hours.   BUN 23 8 - 23 mg/dL   Creatinine, Ser 0.27 0.44 - 1.00 mg/dL   Calcium 8.0 (L) 8.9 - 10.3 mg/dL   Total Protein 5.4 (L) 6.5 - 8.1 g/dL   Albumin 2.6 (L) 3.5 - 5.0 g/dL   AST 14 (L) 15 - 41 U/L   ALT 7 0 - 44 U/L   Alkaline Phosphatase 41 38 - 126 U/L   Total Bilirubin <0.2 0.0 - 1.2 mg/dL   GFR, Estimated >25 >36 mL/min    Comment: (NOTE) Calculated using the CKD-EPI Creatinine Equation (2021)    Anion gap 13 5 - 15    Comment: Performed at [facility], [address], [place], [place] [address]   Review of Systems  Constitutional:  Positive for activity change, appetite change and fatigue. Negative for chills, diaphoresis, fever and unexpected weight change.  HENT: Negative.    Eyes: Negative.   Respiratory: Negative.    Gastrointestinal:  Positive for abdominal pain, anal bleeding, blood in stool and constipation.  Endocrine: Negative.   Genitourinary:  Negative.   Musculoskeletal:  Positive for arthralgias.  Allergic/Immunologic: Negative for environmental allergies.  Neurological:  Positive for dizziness, weakness, light-headedness and headaches. Negative for tremors, seizures, syncope, facial asymmetry, speech difficulty and numbness.  Hematological: Negative.   Psychiatric/Behavioral: Negative.     Blood pressure (!) 102/55, pulse 65, temperature 98 F (36.7 C), temperature source Oral, resp. rate 19, SpO2 100%. Physical Exam Constitutional:      General: She is not in acute distress.    Appearance: She is well-developed. She is obese. She is not ill-appearing.  HENT:     Head: Normocephalic and atraumatic.  Eyes:     Extraocular Movements: Extraocular movements intact.     Pupils: Pupils are equal, round, and reactive to light.  Cardiovascular:     Rate and Rhythm: Normal rate and regular rhythm.  Pulmonary:     Effort: Pulmonary effort is normal.     Breath sounds: Normal breath sounds.  Musculoskeletal:        General: Normal range of motion.  Skin:    General: Skin is warm and dry.  Neurological:     General: No focal deficit present.     Mental Status: She is alert and oriented to person, place, and time.  Psychiatric:        Mood and Affect: Mood normal.        Behavior: Behavior normal.   Assessment/Plan: 1) Acute posthemorrhagic anemia with melena/bright red bleeding per rectum-patient has been transfused 1 unit of packed red blood cells. Continue IV PPIs-EGD is planned for the patient tomorrow BUN is normal.  Had a history of a diverticular bleed in the past and therefore not sure if it is a right-sided colonic lesion or her upper GI bleed.  If the EGD is unrevealing a colonoscopy will be done. 2) GERD-on PPI's. 3) Rheumatoid arthritis/osteoarthritis-on sulfasalazine 4) Morbid obesity s/p gastric Roux-en-Y  bypass surgery.  5) Hypertension. Charna Elizabeth 12/22/2023, 1:24 PM

## 2023-12-22 NOTE — ED Notes (Signed)
 Dr. Loreta Ave of [facility] paged [phone]) at the request of Dr. Criss Alvine.

## 2023-12-23 ENCOUNTER — Observation Stay (HOSPITAL_COMMUNITY): Payer: Self-pay | Admitting: Anesthesiology

## 2023-12-23 ENCOUNTER — Encounter (HOSPITAL_COMMUNITY)
Admission: EM | Disposition: A | Discharge status: Home / Self Care | Payer: Self-pay | Source: Home / Self Care | Attending: Emergency Medicine

## 2023-12-23 DIAGNOSIS — Z87891 Personal history of nicotine dependence: Secondary | ICD-10-CM | POA: Diagnosis not present

## 2023-12-23 DIAGNOSIS — I10 Essential (primary) hypertension: Secondary | ICD-10-CM

## 2023-12-23 DIAGNOSIS — K284 Chronic or unspecified gastrojejunal ulcer with hemorrhage: Secondary | ICD-10-CM | POA: Diagnosis not present

## 2023-12-23 DIAGNOSIS — K922 Gastrointestinal hemorrhage, unspecified: Secondary | ICD-10-CM | POA: Diagnosis not present

## 2023-12-23 HISTORY — PX: ESOPHAGOGASTRODUODENOSCOPY: SHX5428

## 2023-12-23 LAB — TYPE AND SCREEN
ABO/RH(D): A POS
Antibody Screen: NEGATIVE
Unit division: 0
Unit division: 0

## 2023-12-23 LAB — COMPREHENSIVE METABOLIC PANEL WITH GFR
ALT: 9 U/L (ref 0–44)
AST: 12 U/L — ABNORMAL LOW (ref 15–41)
Albumin: 2.3 g/dL — ABNORMAL LOW (ref 3.5–5.0)
Alkaline Phosphatase: 38 U/L (ref 38–126)
Anion gap: 10 (ref 5–15)
BUN: 10 mg/dL (ref 8–23)
CO2: 24 mmol/L (ref 22–32)
Calcium: 8.3 mg/dL — ABNORMAL LOW (ref 8.9–10.3)
Chloride: 106 mmol/L (ref 98–111)
Creatinine, Ser: 0.71 mg/dL (ref 0.44–1.00)
GFR, Estimated: >60 mL/min (ref >60)
Glucose, Bld: 87 mg/dL (ref 70–99)
Potassium: 3.7 mmol/L (ref 3.5–5.1)
Sodium: 140 mmol/L (ref 135–145)
Total Bilirubin: 0.5 mg/dL (ref 0.0–1.2)
Total Protein: 4.8 g/dL — ABNORMAL LOW (ref 6.5–8.1)

## 2023-12-23 LAB — BPAM RBC
Blood Product Expiration Date: 202505082359
Blood Product Expiration Date: 202505102359
ISSUE DATE / TIME: 202504091400
ISSUE DATE / TIME: 202504092313
Unit Type and Rh: 6200
Unit Type and Rh: 6200

## 2023-12-23 LAB — CBC
HCT: 24.9 % — ABNORMAL LOW (ref 36.0–46.0)
Hemoglobin: 7.8 g/dL — ABNORMAL LOW (ref 12.0–15.0)
MCH: 25.6 pg — ABNORMAL LOW (ref 26.0–34.0)
MCHC: 31.3 g/dL (ref 30.0–36.0)
MCV: 81.6 fL (ref 80.0–100.0)
Platelets: 263 10*3/uL (ref 150–400)
RBC: 3.05 MIL/uL — ABNORMAL LOW (ref 3.87–5.11)
RDW: 16.9 % — ABNORMAL HIGH (ref 11.5–15.5)
WBC: 8.6 10*3/uL (ref 4.0–10.5)
nRBC: 0 % (ref 0.0–0.2)

## 2023-12-23 SURGERY — EGD (ESOPHAGOGASTRODUODENOSCOPY)
Anesthesia: Monitor Anesthesia Care

## 2023-12-23 MED ORDER — SODIUM CHLORIDE 0.9 % IV SOLN: INTRAVENOUS | Status: DC | PRN

## 2023-12-23 MED ORDER — PROPOFOL 10 MG/ML IV BOLUS
INTRAVENOUS | Status: DC | PRN
  Administered 2023-12-23: 50 mg via INTRAVENOUS
  Administered 2023-12-23: 100 mg via INTRAVENOUS
  Administered 2023-12-23: 150 ug/kg/min via INTRAVENOUS

## 2023-12-23 MED ORDER — SUCRALFATE 1 G PO TABS
1.0000 g | ORAL_TABLET | Freq: Three times a day (TID) | ORAL | Status: DC
  Administered 2023-12-23 – 2023-12-24 (×3): 1 g via ORAL
  Filled 2023-12-23 (×3): qty 1

## 2023-12-23 MED ORDER — LIDOCAINE 2% (20 MG/ML) 5 ML SYRINGE
INTRAMUSCULAR | Status: DC | PRN
  Administered 2023-12-23: 80 mg via INTRAVENOUS

## 2023-12-23 MED ORDER — PHENYLEPHRINE 80 MCG/ML (10ML) SYRINGE FOR IV PUSH (FOR BLOOD PRESSURE SUPPORT)
PREFILLED_SYRINGE | INTRAVENOUS | Status: DC | PRN
Start: 2023-12-23 — End: 2023-12-23
  Administered 2023-12-23: 80 ug via INTRAVENOUS
  Administered 2023-12-23: 160 ug via INTRAVENOUS

## 2023-12-23 MED ORDER — LORATADINE 10 MG PO TABS
10.0000 mg | ORAL_TABLET | Freq: Every day | ORAL | Status: DC
  Administered 2023-12-23 – 2023-12-24 (×2): 10 mg via ORAL
  Filled 2023-12-23 (×2): qty 1

## 2023-12-23 NOTE — Anesthesia Preprocedure Evaluation (Addendum)
 Anesthesia Evaluation  Patient identified by MRN, date of birth, ID band Patient awake    Reviewed: Allergy & Precautions, NPO status , Patient's Chart, lab work & pertinent test results, reviewed documented beta blocker date and time   Airway Mallampati: I  TM Distance: >3 FB Neck ROM: Full    Dental no notable dental hx. (+) Teeth Intact, Dental Advisory Given   Pulmonary former smoker Quit smoking 2011   Pulmonary exam normal breath sounds clear to auscultation       Cardiovascular hypertension, Pt. on home beta blockers Normal cardiovascular exam Rhythm:Regular Rate:Normal     Neuro/Psych    GI/Hepatic PUD,GERD  Medicated and Controlled,,GIB history of gastric bypass     Endo/Other  Obesity BMI 34  Renal/GU      Musculoskeletal  (+) Arthritis , Osteoarthritis and Rheumatoid disorders,    Abdominal  (+) + obese  Peds  Hematology  (+) Blood dyscrasia, anemia   Anesthesia Other Findings   Reproductive/Obstetrics                             Anesthesia Physical Anesthesia Plan  ASA: 3  Anesthesia Plan: MAC   Post-op Pain Management: Minimal or no pain anticipated   Induction: Intravenous  PONV Risk Score and Plan: 2 and Propofol infusion  Airway Management Planned: Natural Airway and Simple Face Mask  Additional Equipment: None  Intra-op Plan:   Post-operative Plan:   Informed Consent: I have reviewed the patients History and Physical, chart, labs and discussed the procedure including the risks, benefits and alternatives for the proposed anesthesia with the patient or authorized representative who has indicated his/her understanding and acceptance.       Plan Discussed with: CRNA and Anesthesiologist  Anesthesia Plan Comments:         Anesthesia Quick Evaluation

## 2023-12-23 NOTE — Transfer of Care (Signed)
 Immediate Anesthesia Transfer of Care Note  Patient: Mackenzie Rivera  Procedure(s) Performed: EGD (ESOPHAGOGASTRODUODENOSCOPY)  Patient Location: PACU  Anesthesia Type:MAC  Level of Consciousness: awake, alert , and oriented  Airway & Oxygen Therapy: Patient Spontanous Breathing and Patient connected to nasal cannula oxygen  Post-op Assessment: Report given to RN and Post -op Vital signs reviewed and stable  Post vital signs: Reviewed and stable  Last Vitals:  Vitals Value Taken Time  BP 89/59 12/23/23 1427  Temp    Pulse 93 12/23/23 1428  Resp 24 12/23/23 1428  SpO2 100 % 12/23/23 1428  Vitals shown include unfiled device data.  Last Pain:  Vitals:   12/23/23 1308  TempSrc: Tympanic  PainSc: 0-No pain         Complications: No notable events documented.

## 2023-12-23 NOTE — Progress Notes (Addendum)
 Progress Note   Patient: Mackenzie Rivera WUJ:[id number] DOB: 11/15/1960 DOA: 12/22/2023     0 DOS: the patient was seen and examined on 12/23/2023   Brief hospital course:  63 y.o. woman with PMH of hypertension, GERD, PUD, GI bleed, anemia, RA, obesity, gastric bypass presenting with abdominal pain and rectal bleeding.  Patient seen by gastroenterology.  EGD and colonoscopy scheduled.  Assessment and Plan:  GI bleed  Anemia History of GI bleed, PUD, GERD History of Roux-en-Y gastric bypass Presented with 2 weeks of abdominal pain and decreased p.o. intake.  With black stools followed by bloody stools. History of prior GI bleed secondary to ulcerative disease in the setting of history of Roux-en-Y gastric bypass. Hemoglobin noted to be 7.1 in the ED, 2 units ordered for transfusion. GI consulted in the ED and saw the patient, will it appears that will likely be patient's primary GI provider Dr. Loreta Ave. Patient is s/p transfusion of 2 units PRBCs on 12/22/2023. -For EGD/colonoscopy today. - Continue IV PPI. - Trend CBC   Near-syncope Episode occurred in the ED.  Transient hypotension which has since improved and not recurred.  Likely related to GI bleeding above.   -Hold off on further workup at this time. - Continue to monitor   RA - Continue home sulfasalazine - Hold Prednisone   Hypertension - Holding home atenolol in the setting of low normal blood pressure   Class I Obesity Body mass index is 33.91 kg/m.  Patient is s/p Roux-en-Y gastric bypass.  - For outpatient follow up.       Subjective: Patient states she is feeling well.  Her only complaint is a slight headache which she relates to her not having eaten.  She also says she has some chest congestion related to postnasal drip. Patient says she has always been anemic with a hemoglobin baseline of 10-11.  Physical Exam: Vitals:   12/22/23 2333 12/23/23 0327 12/23/23 0742 12/23/23 1308  BP: 129/76 128/71 128/77 131/88   Pulse: 76 70 79 84  Resp: 18 18 18 20   Temp: 98.3 F (36.8 C) 98 F (36.7 C) (!) 97.5 F (36.4 C) 98.1 F (36.7 C)  TempSrc: Oral Oral Oral Tympanic  SpO2: 100% 100% 100% 100%  Weight:      Height:         General: Alert, oriented X3  Eyes: Pupils equal, reactive  Oral cavity: moist mucous membranes  Head: Atraumatic, normocephalic  Neck: supple  Chest: clear to auscultation. No crackles, no wheezes  CVS: S1,S2 RRR. No murmurs  Abd: No distention, soft, non-tender. No masses palpable  Extr: No edema   MSK: No joint deformities or swelling  Neurological: Grossly intact.    Data Reviewed:     Latest Ref Rng & Units 12/23/2023    6:10 AM 12/22/2023    9:04 PM 12/22/2023   10:32 AM  CBC  WBC 4.0 - 10.5 K/uL 8.6  10.4  14.0   Hemoglobin 12.0 - 15.0 g/dL 7.8  7.0  7.1   Hematocrit 36.0 - 46.0 % 24.9  22.5  24.5   Platelets 150 - 400 K/uL 263  284  347       Latest Ref Rng & Units 12/23/2023    6:10 AM 12/22/2023   10:32 AM 07/06/2020    3:30 AM  BMP  Glucose 70 - 99 mg/dL 87  956  82   BUN 8 - 23 mg/dL 10  23  9    Creatinine 0.44 -  1.00 mg/dL 6.04  5.40  9.81   Sodium 135 - 145 mmol/L 140  139  141   Potassium 3.5 - 5.1 mmol/L 3.7  3.1  3.6   Chloride 98 - 111 mmol/L 106  108  109   CO2 22 - 32 mmol/L 24  18  21    Calcium 8.9 - 10.3 mg/dL 8.3  8.0  8.6      Family Communication: N/a  Disposition: Status is: Observation The patient will require care spanning > 2 midnights and should be moved to inpatient because: Ongoing blood loss requiring blood transfusion and further evaluation  Planned Discharge Destination: Home    Time spent: 40 minutes  Author: Marcine Matar, MD 12/23/2023 1:48 PM  For on call review [email or web address].

## 2023-12-23 NOTE — Interval H&P Note (Signed)
 History and Physical Interval Note:  12/23/2023 2:11 PM  Mackenzie Rivera  has presented today for surgery, with the diagnosis of gi bleed.  The various methods of treatment have been discussed with the patient and family. After consideration of risks, benefits and other options for treatment, the patient has consented to  Procedure(s): EGD (ESOPHAGOGASTRODUODENOSCOPY) (N/A) as a surgical intervention.  The patient's history has been reviewed, patient examined, no change in status, stable for surgery.  I have reviewed the patient's chart and labs.  Questions were answered to the patient's satisfaction.     Aliha Diedrich D

## 2023-12-23 NOTE — Care Management Obs Status (Signed)
 MEDICARE OBSERVATION STATUS NOTIFICATION   Patient Details  Name: Mackenzie Rivera MRN: [id number] Date of Birth: 16-Oct-1960   Medicare Observation Status Notification Given:  Yes  Waynetta Sandy Corliss Skains letter given and signed  Dorena Bodo 12/23/2023, 9:39 AM

## 2023-12-23 NOTE — Plan of Care (Signed)

## 2023-12-23 NOTE — Op Note (Signed)
 [facility] Patient Name: Mackenzie Rivera Procedure Date : 12/23/2023 MRN: [id number] Attending MD: Jeani Hawking , MD, [id number] Date of Birth: March 22, 1961 CSN: [id number] Age: 63 Admit Type: Inpatient Procedure:                Upper GI endoscopy Indications:              Melena Providers:                Jeani Hawking, MD, Suzy Bouchard, RN, Harrington Challenger,                            Technician Referring MD:              Medicines:                Propofol per Anesthesia Complications:            No immediate complications. Estimated Blood Loss:     Estimated blood loss: none. Procedure:                Pre-Anesthesia Assessment:                           - Prior to the procedure, a History and Physical                            was performed, and patient medications and                            allergies were reviewed. The patient's tolerance of                            previous anesthesia was also reviewed. The risks                            and benefits of the procedure and the sedation                            options and risks were discussed with the patient.                            All questions were answered, and informed consent                            was obtained. Prior Anticoagulants: The patient has                            taken no anticoagulant or antiplatelet agents. ASA                            Grade Assessment: III - A patient with severe                            systemic disease. After reviewing the risks and  benefits, the patient was deemed in satisfactory                            condition to undergo the procedure.                           - Sedation was administered by an anesthesia                            professional. Deep sedation was attained.                           After obtaining informed consent, the endoscope was                            passed under direct vision. Throughout the                             procedure, the patient's blood pressure, pulse, and                            oxygen saturations were monitored continuously. The                            [id number]-[id number] ([id number]) Olympus endoscope was introduced                            through the mouth, and advanced to the proximal                            jejunum. The upper GI endoscopy was accomplished                            without difficulty. The patient tolerated the                            procedure well. Scope In: Scope Out: Findings:      The esophagus was normal.      One non-bleeding cratered and superficial gastric ulcer with a clean       ulcer base (Forrest Class III) was found at the anastomosis. The lesion       was 25 mm in largest dimension.      The examined jejunum was normal.      The current findings mirror the findings of the prior EGD in 2021 when       she presented with melena. It is not clear if the ulcer completely       healed. The gastric pouch was 3 cm. There was no evidence of active       bleeding or visible vessels. Impression:               - Normal esophagus.                           - Non-bleeding gastric ulcer with a clean ulcer  base (Forrest Class III).                           - Normal examined jejunum.                           - No specimens collected. Recommendation:           - Return patient to hospital ward for ongoing care.                           - Resume regular diet.                           - Continue present medications.                           - PPI BID x 1 month then QD.                           - Sucralfate 1 gram TID x 1 month.                           - Repeat EGD in 1-2 months to assess for healing.                           - If there is no significant healing, a trail of                            [place] is appropriate.                           - Follow up with Dr. Loreta Ave in 1-2 weeks upon                             discharge. Procedure Code(s):        --- Professional ---                           [phone], Esophagogastroduodenoscopy, flexible,                            transoral; diagnostic, including collection of                            specimen(s) by brushing or washing, when performed                            (separate procedure) Diagnosis Code(s):        --- Professional ---                           K25.9, Gastric ulcer, unspecified as acute or                            chronic, without hemorrhage or perforation  K92.1, Melena (includes Hematochezia) CPT copyright 2022 [employer]. All rights reserved. The codes documented in this report are preliminary and upon coder review may  be revised to meet current compliance requirements. Jeani Hawking, MD Jeani Hawking, MD 12/23/2023 2:30:54 PM This report has been signed electronically. Number of Addenda: 0

## 2023-12-24 ENCOUNTER — Other Ambulatory Visit (HOSPITAL_COMMUNITY): Payer: Self-pay

## 2023-12-24 DIAGNOSIS — K922 Gastrointestinal hemorrhage, unspecified: Secondary | ICD-10-CM | POA: Diagnosis not present

## 2023-12-24 LAB — COMPREHENSIVE METABOLIC PANEL WITH GFR
ALT: 10 U/L (ref 0–44)
AST: 12 U/L — ABNORMAL LOW (ref 15–41)
Albumin: 2.7 g/dL — ABNORMAL LOW (ref 3.5–5.0)
Alkaline Phosphatase: 46 U/L (ref 38–126)
Anion gap: 11 (ref 5–15)
BUN: 8 mg/dL (ref 8–23)
CO2: 23 mmol/L (ref 22–32)
Calcium: 8.4 mg/dL — ABNORMAL LOW (ref 8.9–10.3)
Chloride: 105 mmol/L (ref 98–111)
Creatinine, Ser: 0.82 mg/dL (ref 0.44–1.00)
GFR, Estimated: >60 mL/min (ref >60)
Glucose, Bld: 85 mg/dL (ref 70–99)
Potassium: 3.6 mmol/L (ref 3.5–5.1)
Sodium: 139 mmol/L (ref 135–145)
Total Bilirubin: 0.5 mg/dL (ref 0.0–1.2)
Total Protein: 5.5 g/dL — ABNORMAL LOW (ref 6.5–8.1)

## 2023-12-24 LAB — CBC
HCT: 27.2 % — ABNORMAL LOW (ref 36.0–46.0)
Hemoglobin: 8.4 g/dL — ABNORMAL LOW (ref 12.0–15.0)
MCH: 25 pg — ABNORMAL LOW (ref 26.0–34.0)
MCHC: 30.9 g/dL (ref 30.0–36.0)
MCV: 81 fL (ref 80.0–100.0)
Platelets: 270 10*3/uL (ref 150–400)
RBC: 3.36 MIL/uL — ABNORMAL LOW (ref 3.87–5.11)
RDW: 16.8 % — ABNORMAL HIGH (ref 11.5–15.5)
WBC: 9.2 10*3/uL (ref 4.0–10.5)
nRBC: 0 % (ref 0.0–0.2)

## 2023-12-24 MED ORDER — PANTOPRAZOLE SODIUM 40 MG PO TBEC
40.0000 mg | DELAYED_RELEASE_TABLET | Freq: Two times a day (BID) | ORAL | Status: DC
  Administered 2023-12-24: 40 mg via ORAL
  Filled 2023-12-24: qty 1

## 2023-12-24 MED ORDER — SUCRALFATE 1 G PO TABS
1.0000 g | ORAL_TABLET | Freq: Three times a day (TID) | ORAL | 0 refills | Status: AC
  Filled 2023-12-24: qty 120, 30d supply, fill #0

## 2023-12-24 NOTE — Discharge Summary (Signed)
 Physician Discharge Summary   Patient: Mackenzie Rivera MRN: [id number] DOB: [birth year]  Admit date:     12/22/2023  Discharge date: 12/24/23  Discharge Physician: MDALA-GAUSI, Gwenette Greet   PCP: [name], MD   Recommendations at discharge:   Follow-up with gastroenterology  Discharge Diagnoses: Principal Problem:   GI bleed Active Problems:   Hypertension   Rheumatoid arthritis (HCC)   Morbid obesity (HCC)   History of anemia   GERD (gastroesophageal reflux disease)   History of Roux-en-Y gastric bypass   PUD (peptic ulcer disease)  Resolved Problems:   * No resolved hospital problems. *  Hospital Course: 63 y.o. woman with PMH of hypertension, GERD, PUD, GI bleed, anemia, RA, obesity, gastric bypass presenting with abdominal pain and rectal bleeding.  Patient seen by gastroenterology.  She underwent EGD on 12/23/2023.  The hospital course is in problem-based format below:    GI bleed  Anemia History of GI bleed, PUD, GERD History of Roux-en-Y gastric bypass Presented with 2 weeks of abdominal pain and decreased p.o. intake.  She reported black stools followed by bloody stools. History of prior GI bleed secondary to ulcerative disease in the setting of history of Roux-en-Y gastric bypass. Hemoglobin noted to be 7.1 in the ED, 2 units ordered for transfusion. GI was consulted. Patient underwent EGD and was found to have: - Normal esophagus - Nonbleeding gastric ulcer with a clean ulcer base (Forrest class III) - Normal duodenum - No specimens collected. Gastroenterology recommended PPI twice daily for 1 month and then daily, sucralfate 1 g 3 times daily for 1 month, repeat EGD in 1 to 2 months to assess for healing. Her hemoglobin was 8.4 on the day of discharge.  Near-syncope Episode occurred in the ED. she had transient hypotension which improved and did not recur.  Likely related to GI bleeding above.   No further workup was done.   RA Home sulfasalazine was  continued. Home prednisone was resumed at discharge.   Hypertension Home atenolol was resumed.   Class I Obesity Body mass index is 33.91 kg/m.  Patient is s/p Roux-en-Y gastric bypass.  For outpatient follow up.           Consultants: Gastroenterology Procedures performed: EGD Disposition: Home Diet recommendation:  Discharge Diet Orders (From admission, onward)     Start     Ordered   12/24/23 0000  Diet - low sodium heart healthy        12/24/23 1013           Regular diet DISCHARGE MEDICATION: Allergies as of 12/24/2023   No Known Allergies      Medication List     TAKE these medications    acetaminophen 500 MG tablet Commonly known as: TYLENOL Take 500 mg by mouth daily as needed for moderate pain (pain score 4-6) or headache.   ASHWAGANDHA PO Take 0.5 mLs by mouth at bedtime.   atenolol 50 MG tablet Commonly known as: TENORMIN Take 50 mg by mouth at bedtime.   CHELATED IRON PO Take 1 tablet by mouth every other day.   OIL OF OREGANO PO Take 2 capsules by mouth daily.   OVER THE COUNTER MEDICATION Take 2 capsules by mouth daily after breakfast. OTC [place] Seamoss   pantoprazole 40 MG tablet Commonly known as: PROTONIX Take 1 tablet (40 mg total) by mouth 2 (two) times daily. Continue 1 tablet daily after 1 month. What changed: when to take this   predniSONE 5 MG tablet Commonly  known as: DELTASONE Take 10-20 mg by mouth daily as needed (RA flares).   sucralfate 1 g tablet Commonly known as: CARAFATE Take 1 tablet (1 g total) by mouth 4 (four) times daily -  with meals and at bedtime.   sulfaSALAzine 500 MG EC tablet Commonly known as: AZULFIDINE Take 2 tablets (1,000 mg total) by mouth 2 (two) times daily.   Turmeric Curcumin Caps Take 2 capsules by mouth daily after breakfast.        Discharge Exam: Filed Weights   12/22/23 1512  Weight: 84.1 kg     General: Alert, oriented X3  Eyes: Pupils equal, reactive  Oral  cavity: moist mucous membranes  Head: Atraumatic, normocephalic  Neck: supple  Chest: clear to auscultation. No crackles, no wheezes  CVS: S1,S2 RRR. No murmurs  Abd: No distention, soft, non-tender. No masses palpable  Extr: No edema   MSK: No joint deformities or swelling  Neurological: Grossly intact.    Condition at discharge: stable  The results of significant diagnostics from this hospitalization (including imaging, microbiology, ancillary and laboratory) are listed below for reference.   Imaging Studies: No results found.  Microbiology: Results for orders placed or performed during the hospital encounter of 07/05/20  Respiratory Panel by RT PCR (Flu A&B, Covid) - Nasopharyngeal Swab     Status: None   Collection Time: 07/05/20  2:31 AM   Specimen: Nasopharyngeal Swab  Result Value Ref Range Status   SARS Coronavirus 2 by RT PCR NEGATIVE NEGATIVE Final    Comment: (NOTE) SARS-CoV-2 target nucleic acids are NOT DETECTED.  The SARS-CoV-2 RNA is generally detectable in upper respiratoy specimens during the acute phase of infection. The lowest concentration of SARS-CoV-2 viral copies this assay can detect is 131 copies/mL. A negative result does not preclude SARS-Cov-2 infection and should not be used as the sole basis for treatment or other patient management decisions. A negative result may occur with  improper specimen collection/handling, submission of specimen other than nasopharyngeal swab, presence of viral mutation(s) within the areas targeted by this assay, and inadequate number of viral copies (<131 copies/mL). A negative result must be combined with clinical observations, patient history, and epidemiological information. The expected result is Negative.  Fact Sheet for Patients:  https://www.moore.com/  Fact Sheet for Healthcare Providers:  https://www.young.biz/  This test is no t yet approved or cleared by the [place] FDA and  has been authorized for detection and/or diagnosis of SARS-CoV-2 by FDA under an Emergency Use Authorization (EUA). This EUA will remain  in effect (meaning this test can be used) for the duration of the COVID-19 declaration under Section 564(b)(1) of the Act, 21 U.S.C. section 360bbb-3(b)(1), unless the authorization is terminated or revoked sooner.     Influenza A by PCR NEGATIVE NEGATIVE Final   Influenza B by PCR NEGATIVE NEGATIVE Final    Comment: (NOTE) The Xpert Xpress SARS-CoV-2/FLU/RSV assay is intended as an aid in  the diagnosis of influenza from Nasopharyngeal swab specimens and  should not be used as a sole basis for treatment. Nasal washings and  aspirates are unacceptable for Xpert Xpress SARS-CoV-2/FLU/RSV  testing.  Fact Sheet for Patients: https://www.moore.com/  Fact Sheet for Healthcare Providers: https://www.young.biz/  This test is not yet approved or cleared by the [place] FDA and  has been authorized for detection and/or diagnosis of SARS-CoV-2 by  FDA under an Emergency Use Authorization (EUA). This EUA will remain  in effect (meaning this test can be used)  for the duration of the  Covid-19 declaration under Section 564(b)(1) of the Act, 21  U.S.C. section 360bbb-3(b)(1), unless the authorization is  terminated or revoked. Performed at [facility], [address] [place], [place] [address]     Labs: CBC: Recent Labs  Lab 12/22/23 1032 12/22/23 2104 12/23/23 0610 12/24/23 0648  WBC 14.0* 10.4 8.6 9.2  HGB 7.1* 7.0* 7.8* 8.4*  HCT 24.5* 22.5* 24.9* 27.2*  MCV 81.9 80.6 81.6 81.0  PLT 347 284 263 270   Basic Metabolic Panel: Recent Labs  Lab 12/22/23 1032 12/22/23 1850 12/23/23 0610 12/24/23 0648  NA 139  --  140 139  K 3.1*  --  3.7 3.6  CL 108  --  106 105  CO2 18*  --  24 23  GLUCOSE 125*  --  87 85  BUN 23  --  10 8  CREATININE 0.78  --  0.71 0.82   CALCIUM 8.0*  --  8.3* 8.4*  MG  --  1.8  --   --    Liver Function Tests: Recent Labs  Lab 12/22/23 1032 12/23/23 0610 12/24/23 0648  AST 14* 12* 12*  ALT 7 9 10   ALKPHOS 41 38 46  BILITOT <0.2 0.5 0.5  PROT 5.4* 4.8* 5.5*  ALBUMIN 2.6* 2.3* 2.7*   CBG: No results for input(s): "GLUCAP" in the last 168 hours.  Discharge time spent: greater than 30 minutes.  Signed: MDALA-GAUSI, Gwenette Greet, MD [facility] 12/24/2023

## 2023-12-24 NOTE — Plan of Care (Signed)

## 2023-12-24 NOTE — Anesthesia Postprocedure Evaluation (Signed)
 Anesthesia Post Note  Patient: Mackenzie Rivera  Procedure(s) Performed: EGD (ESOPHAGOGASTRODUODENOSCOPY)     Patient location during evaluation: PACU Anesthesia Type: MAC Level of consciousness: awake and alert Pain management: pain level controlled Vital Signs Assessment: post-procedure vital signs reviewed and stable Respiratory status: spontaneous breathing, nonlabored ventilation and respiratory function stable Cardiovascular status: blood pressure returned to baseline and stable Postop Assessment: no apparent nausea or vomiting Anesthetic complications: no   No notable events documented.  Last Vitals:  Vitals:   12/23/23 1944 12/24/23 0432  BP: 126/62 139/78  Pulse: 82 75  Resp: 18 17  Temp: 37.4 C 37.4 C  SpO2: 99% 99%    Last Pain:  Vitals:   12/24/23 0432  TempSrc: Oral  PainSc:    Pain Goal:                   Lannie Fields

## 2023-12-27 ENCOUNTER — Encounter (HOSPITAL_COMMUNITY): Payer: Self-pay | Admitting: Gastroenterology

## 2024-04-12 ENCOUNTER — Other Ambulatory Visit: Payer: Self-pay | Admitting: Internal Medicine

## 2024-04-12 DIAGNOSIS — Z1231 Encounter for screening mammogram for malignant neoplasm of breast: Secondary | ICD-10-CM

## 2024-04-13 ENCOUNTER — Inpatient Hospital Stay
Admission: RE | Admit: 2024-04-13 | Discharge: 2024-04-13 | Discharge status: Home / Self Care | Source: Ambulatory Visit | Attending: Internal Medicine | Admitting: Internal Medicine

## 2024-04-13 DIAGNOSIS — Z1231 Encounter for screening mammogram for malignant neoplasm of breast: Secondary | ICD-10-CM
